# Patient Record
Sex: Female | Born: 1940 | Race: White | Hispanic: No | Marital: Single | Smoking: Former smoker
Health system: Southern US, Community
[De-identification: ages and names within clinical notes are randomized; demographics above are authoritative.]

## PROBLEM LIST (undated history)

## (undated) DIAGNOSIS — R011 Cardiac murmur, unspecified: Secondary | ICD-10-CM

## (undated) DIAGNOSIS — K5792 Diverticulitis of intestine, part unspecified, without perforation or abscess without bleeding: Secondary | ICD-10-CM

## (undated) DIAGNOSIS — E785 Hyperlipidemia, unspecified: Secondary | ICD-10-CM

## (undated) DIAGNOSIS — I5189 Other ill-defined heart diseases: Secondary | ICD-10-CM

## (undated) DIAGNOSIS — M47812 Spondylosis without myelopathy or radiculopathy, cervical region: Secondary | ICD-10-CM

## (undated) DIAGNOSIS — I480 Paroxysmal atrial fibrillation: Secondary | ICD-10-CM

## (undated) DIAGNOSIS — I10 Essential (primary) hypertension: Secondary | ICD-10-CM

## (undated) DIAGNOSIS — M8580 Other specified disorders of bone density and structure, unspecified site: Secondary | ICD-10-CM

## (undated) DIAGNOSIS — K219 Gastro-esophageal reflux disease without esophagitis: Secondary | ICD-10-CM

## (undated) DIAGNOSIS — I340 Nonrheumatic mitral (valve) insufficiency: Secondary | ICD-10-CM

## (undated) DIAGNOSIS — K449 Diaphragmatic hernia without obstruction or gangrene: Secondary | ICD-10-CM

## (undated) DIAGNOSIS — I060 Rheumatic aortic stenosis: Secondary | ICD-10-CM

## (undated) HISTORY — DX: Cardiac murmur, unspecified: R01.1

## (undated) HISTORY — PX: US ECHOCARDIOGRAPHY: HXRAD669

## (undated) HISTORY — DX: Hyperlipidemia, unspecified: E78.5

## (undated) HISTORY — DX: Rheumatic aortic stenosis: I06.0

## (undated) HISTORY — DX: Paroxysmal atrial fibrillation: I48.0

## (undated) HISTORY — DX: Other specified disorders of bone density and structure, unspecified site: M85.80

## (undated) HISTORY — DX: Diaphragmatic hernia without obstruction or gangrene: K44.9

## (undated) HISTORY — PX: OTHER SURGICAL HISTORY: SHX169

## (undated) HISTORY — PX: TOTAL VAGINAL HYSTERECTOMY: SHX2548

## (undated) HISTORY — DX: Spondylosis without myelopathy or radiculopathy, cervical region: M47.812

## (undated) HISTORY — DX: Diverticulitis of intestine, part unspecified, without perforation or abscess without bleeding: K57.92

## (undated) HISTORY — PX: CHOLECYSTECTOMY: SHX55

## (undated) HISTORY — DX: Nonrheumatic mitral (valve) insufficiency: I34.0

## (undated) HISTORY — DX: Essential (primary) hypertension: I10

## (undated) HISTORY — DX: Other ill-defined heart diseases: I51.89

## (undated) HISTORY — DX: Gastro-esophageal reflux disease without esophagitis: K21.9

---

## 1997-03-19 ENCOUNTER — Ambulatory Visit (HOSPITAL_COMMUNITY): Admission: RE | Admit: 1997-03-19 | Discharge: 1997-03-19 | Payer: Self-pay | Admitting: Family Medicine

## 1997-03-26 ENCOUNTER — Ambulatory Visit (HOSPITAL_COMMUNITY): Admission: RE | Admit: 1997-03-26 | Discharge: 1997-03-26 | Payer: Self-pay | Admitting: Family Medicine

## 1997-07-11 ENCOUNTER — Ambulatory Visit (HOSPITAL_COMMUNITY): Admission: RE | Admit: 1997-07-11 | Discharge: 1997-07-11 | Payer: Self-pay | Admitting: Family Medicine

## 1998-04-17 ENCOUNTER — Ambulatory Visit (HOSPITAL_COMMUNITY): Admission: RE | Admit: 1998-04-17 | Discharge: 1998-04-17 | Payer: Self-pay | Admitting: Family Medicine

## 1998-04-17 ENCOUNTER — Encounter: Payer: Self-pay | Admitting: Family Medicine

## 1998-06-03 ENCOUNTER — Other Ambulatory Visit: Admission: RE | Admit: 1998-06-03 | Discharge: 1998-06-03 | Payer: Self-pay | Admitting: Family Medicine

## 1999-12-13 ENCOUNTER — Emergency Department (HOSPITAL_COMMUNITY): Admission: EM | Admit: 1999-12-13 | Discharge: 1999-12-13 | Payer: Self-pay | Admitting: Emergency Medicine

## 2000-10-31 ENCOUNTER — Encounter: Payer: Self-pay | Admitting: Specialist

## 2000-11-07 ENCOUNTER — Encounter: Payer: Self-pay | Admitting: Specialist

## 2000-11-07 ENCOUNTER — Inpatient Hospital Stay (HOSPITAL_COMMUNITY): Admission: RE | Admit: 2000-11-07 | Discharge: 2000-11-08 | Payer: Self-pay | Admitting: Specialist

## 2001-01-24 ENCOUNTER — Emergency Department (HOSPITAL_COMMUNITY): Admission: EM | Admit: 2001-01-24 | Discharge: 2001-01-25 | Payer: Self-pay | Admitting: Emergency Medicine

## 2001-07-17 ENCOUNTER — Other Ambulatory Visit: Admission: RE | Admit: 2001-07-17 | Discharge: 2001-07-17 | Payer: Self-pay | Admitting: Family Medicine

## 2001-08-24 ENCOUNTER — Ambulatory Visit (HOSPITAL_COMMUNITY): Admission: RE | Admit: 2001-08-24 | Discharge: 2001-08-24 | Payer: Self-pay | Admitting: *Deleted

## 2002-08-17 ENCOUNTER — Inpatient Hospital Stay (HOSPITAL_COMMUNITY): Admission: EM | Admit: 2002-08-17 | Discharge: 2002-08-19 | Payer: Self-pay | Admitting: Emergency Medicine

## 2002-08-17 ENCOUNTER — Encounter: Payer: Self-pay | Admitting: Emergency Medicine

## 2002-08-19 ENCOUNTER — Encounter (INDEPENDENT_AMBULATORY_CARE_PROVIDER_SITE_OTHER): Payer: Self-pay | Admitting: Cardiology

## 2002-08-19 ENCOUNTER — Encounter: Payer: Self-pay | Admitting: Internal Medicine

## 2002-12-26 ENCOUNTER — Other Ambulatory Visit: Admission: RE | Admit: 2002-12-26 | Discharge: 2002-12-26 | Payer: Self-pay | Admitting: *Deleted

## 2003-02-26 ENCOUNTER — Ambulatory Visit (HOSPITAL_COMMUNITY): Admission: RE | Admit: 2003-02-26 | Discharge: 2003-02-26 | Payer: Self-pay | Admitting: Gastroenterology

## 2003-11-25 ENCOUNTER — Ambulatory Visit (HOSPITAL_COMMUNITY): Admission: RE | Admit: 2003-11-25 | Discharge: 2003-11-25 | Payer: Self-pay | Admitting: Cardiology

## 2003-12-12 ENCOUNTER — Ambulatory Visit: Payer: Self-pay | Admitting: Internal Medicine

## 2003-12-18 ENCOUNTER — Ambulatory Visit: Payer: Self-pay | Admitting: Pulmonary Disease

## 2003-12-18 ENCOUNTER — Ambulatory Visit: Admission: RE | Admit: 2003-12-18 | Discharge: 2003-12-18 | Payer: Self-pay | Admitting: Internal Medicine

## 2004-01-07 ENCOUNTER — Ambulatory Visit: Payer: Self-pay | Admitting: Internal Medicine

## 2004-01-23 ENCOUNTER — Ambulatory Visit: Payer: Self-pay | Admitting: Internal Medicine

## 2004-02-20 ENCOUNTER — Ambulatory Visit: Payer: Self-pay | Admitting: Internal Medicine

## 2005-04-12 ENCOUNTER — Ambulatory Visit (HOSPITAL_COMMUNITY): Admission: RE | Admit: 2005-04-12 | Discharge: 2005-04-12 | Payer: Self-pay | Admitting: *Deleted

## 2005-11-14 ENCOUNTER — Other Ambulatory Visit: Admission: RE | Admit: 2005-11-14 | Discharge: 2005-11-14 | Payer: Self-pay | Admitting: Family Medicine

## 2007-01-22 ENCOUNTER — Ambulatory Visit (HOSPITAL_COMMUNITY): Admission: RE | Admit: 2007-01-22 | Discharge: 2007-01-22 | Payer: Self-pay | Admitting: Cardiology

## 2007-02-08 ENCOUNTER — Inpatient Hospital Stay (HOSPITAL_COMMUNITY): Admission: AD | Admit: 2007-02-08 | Discharge: 2007-02-10 | Payer: Self-pay | Admitting: Cardiology

## 2007-11-27 ENCOUNTER — Emergency Department (HOSPITAL_BASED_OUTPATIENT_CLINIC_OR_DEPARTMENT_OTHER): Admission: EM | Admit: 2007-11-27 | Discharge: 2007-11-27 | Payer: Self-pay | Admitting: Emergency Medicine

## 2009-03-03 ENCOUNTER — Encounter: Payer: Self-pay | Admitting: Internal Medicine

## 2009-03-13 ENCOUNTER — Ambulatory Visit (HOSPITAL_COMMUNITY): Admission: RE | Admit: 2009-03-13 | Discharge: 2009-03-13 | Payer: Self-pay | Admitting: Cardiology

## 2009-08-18 ENCOUNTER — Encounter: Payer: Self-pay | Admitting: Internal Medicine

## 2009-09-15 ENCOUNTER — Encounter: Payer: Self-pay | Admitting: Internal Medicine

## 2009-09-18 ENCOUNTER — Encounter: Payer: Self-pay | Admitting: Internal Medicine

## 2009-09-24 ENCOUNTER — Encounter: Payer: Self-pay | Admitting: Internal Medicine

## 2009-09-24 ENCOUNTER — Ambulatory Visit (HOSPITAL_COMMUNITY): Admission: RE | Admit: 2009-09-24 | Discharge: 2009-09-24 | Payer: Self-pay | Admitting: Cardiology

## 2009-10-02 ENCOUNTER — Encounter: Payer: Self-pay | Admitting: Internal Medicine

## 2009-10-08 ENCOUNTER — Encounter: Payer: Self-pay | Admitting: Internal Medicine

## 2009-10-19 ENCOUNTER — Telehealth: Payer: Self-pay | Admitting: Internal Medicine

## 2009-10-21 DIAGNOSIS — M159 Polyosteoarthritis, unspecified: Secondary | ICD-10-CM | POA: Insufficient documentation

## 2009-10-21 DIAGNOSIS — I10 Essential (primary) hypertension: Secondary | ICD-10-CM

## 2009-10-21 DIAGNOSIS — E7800 Pure hypercholesterolemia, unspecified: Secondary | ICD-10-CM

## 2009-10-21 DIAGNOSIS — I060 Rheumatic aortic stenosis: Secondary | ICD-10-CM

## 2009-10-21 DIAGNOSIS — K219 Gastro-esophageal reflux disease without esophagitis: Secondary | ICD-10-CM | POA: Insufficient documentation

## 2009-10-21 DIAGNOSIS — I4891 Unspecified atrial fibrillation: Secondary | ICD-10-CM | POA: Insufficient documentation

## 2009-10-23 ENCOUNTER — Ambulatory Visit: Payer: Self-pay | Admitting: Internal Medicine

## 2009-11-10 HISTORY — PX: OTHER SURGICAL HISTORY: SHX169

## 2009-11-18 ENCOUNTER — Encounter: Payer: Self-pay | Admitting: Internal Medicine

## 2009-11-23 ENCOUNTER — Encounter: Payer: Self-pay | Admitting: Internal Medicine

## 2009-11-24 ENCOUNTER — Encounter (INDEPENDENT_AMBULATORY_CARE_PROVIDER_SITE_OTHER): Payer: Self-pay | Admitting: *Deleted

## 2009-11-24 ENCOUNTER — Telehealth: Payer: Self-pay | Admitting: Internal Medicine

## 2009-11-24 ENCOUNTER — Ambulatory Visit: Payer: Self-pay | Admitting: Internal Medicine

## 2009-11-26 LAB — CONVERTED CEMR LAB
BUN: 25 mg/dL — ABNORMAL HIGH (ref 6–23)
Basophils Absolute: 0.1 10*3/uL (ref 0.0–0.1)
Basophils Relative: 0.7 % (ref 0.0–3.0)
CO2: 28 meq/L (ref 19–32)
Calcium: 9.7 mg/dL (ref 8.4–10.5)
Chloride: 99 meq/L (ref 96–112)
Creatinine, Ser: 1.2 mg/dL (ref 0.4–1.2)
Eosinophils Absolute: 0.2 10*3/uL (ref 0.0–0.7)
Eosinophils Relative: 3.2 % (ref 0.0–5.0)
GFR calc non Af Amer: 47.69 mL/min (ref >60)
Glucose, Bld: 92 mg/dL (ref 70–99)
HCT: 39.2 % (ref 36.0–46.0)
Hemoglobin: 13.3 g/dL (ref 12.0–15.0)
INR: 2.2 — ABNORMAL HIGH (ref 0.8–1.0)
Lymphocytes Relative: 38.3 % (ref 12.0–46.0)
Lymphs Abs: 2.7 10*3/uL (ref 0.7–4.0)
MCHC: 33.9 g/dL (ref 30.0–36.0)
MCV: 91.2 fL (ref 78.0–100.0)
Monocytes Absolute: 0.5 10*3/uL (ref 0.1–1.0)
Monocytes Relative: 7 % (ref 3.0–12.0)
Neutro Abs: 3.6 10*3/uL (ref 1.4–7.7)
Neutrophils Relative %: 50.8 % (ref 43.0–77.0)
Platelets: 281 10*3/uL (ref 150.0–400.0)
Potassium: 3.3 meq/L — ABNORMAL LOW (ref 3.5–5.1)
Prothrombin Time: 23.9 s — ABNORMAL HIGH (ref 9.7–11.8)
RBC: 4.3 M/uL (ref 3.87–5.11)
RDW: 14.1 % (ref 11.5–14.6)
Sodium: 137 meq/L (ref 135–145)
WBC: 7 10*3/uL (ref 4.5–10.5)
aPTT: 36.6 s — ABNORMAL HIGH (ref 21.7–28.8)

## 2009-11-30 ENCOUNTER — Ambulatory Visit (HOSPITAL_COMMUNITY): Admission: RE | Admit: 2009-11-30 | Discharge: 2009-11-30 | Payer: Self-pay | Admitting: Cardiology

## 2009-11-30 ENCOUNTER — Encounter (INDEPENDENT_AMBULATORY_CARE_PROVIDER_SITE_OTHER): Payer: Self-pay | Admitting: Cardiology

## 2009-12-01 ENCOUNTER — Ambulatory Visit: Payer: Self-pay | Admitting: Internal Medicine

## 2009-12-01 ENCOUNTER — Ambulatory Visit (HOSPITAL_COMMUNITY): Admission: RE | Admit: 2009-12-01 | Discharge: 2009-12-02 | Payer: Self-pay | Admitting: Internal Medicine

## 2009-12-24 ENCOUNTER — Encounter: Payer: Self-pay | Admitting: Internal Medicine

## 2009-12-30 ENCOUNTER — Encounter: Payer: Self-pay | Admitting: Internal Medicine

## 2009-12-30 ENCOUNTER — Ambulatory Visit: Payer: Self-pay | Admitting: Internal Medicine

## 2010-01-01 LAB — CONVERTED CEMR LAB
ALT: 22 units/L (ref 0–35)
AST: 25 units/L (ref 0–37)
Albumin: 4.2 g/dL (ref 3.5–5.2)
Alkaline Phosphatase: 79 units/L (ref 39–117)
Bilirubin, Direct: 0.1 mg/dL (ref 0.0–0.3)
TSH: 2 microintl units/mL (ref 0.35–5.50)
Total Bilirubin: 1 mg/dL (ref 0.3–1.2)
Total Protein: 7.7 g/dL (ref 6.0–8.3)

## 2010-01-20 ENCOUNTER — Ambulatory Visit
Admission: RE | Admit: 2010-01-20 | Discharge: 2010-01-20 | Payer: Self-pay | Source: Home / Self Care | Attending: Internal Medicine | Admitting: Internal Medicine

## 2010-01-20 ENCOUNTER — Encounter: Payer: Self-pay | Admitting: Internal Medicine

## 2010-01-20 ENCOUNTER — Other Ambulatory Visit: Payer: Self-pay | Admitting: Internal Medicine

## 2010-01-21 ENCOUNTER — Encounter: Payer: Self-pay | Admitting: Internal Medicine

## 2010-01-21 LAB — CBC WITH DIFFERENTIAL/PLATELET
Basophils Absolute: 0 10*3/uL (ref 0.0–0.1)
Basophils Relative: 0.4 % (ref 0.0–3.0)
Eosinophils Absolute: 0.3 10*3/uL (ref 0.0–0.7)
Eosinophils Relative: 4.8 % (ref 0.0–5.0)
HCT: 39 % (ref 36.0–46.0)
Hemoglobin: 13.5 g/dL (ref 12.0–15.0)
Lymphocytes Relative: 33.3 % (ref 12.0–46.0)
Lymphs Abs: 2 10*3/uL (ref 0.7–4.0)
MCHC: 34.7 g/dL (ref 30.0–36.0)
MCV: 90.8 fl (ref 78.0–100.0)
Monocytes Absolute: 0.4 10*3/uL (ref 0.1–1.0)
Monocytes Relative: 7.3 % (ref 3.0–12.0)
Neutro Abs: 3.2 10*3/uL (ref 1.4–7.7)
Neutrophils Relative %: 54.2 % (ref 43.0–77.0)
Platelets: 283 10*3/uL (ref 150.0–400.0)
RBC: 4.29 Mil/uL (ref 3.87–5.11)
RDW: 14.3 % (ref 11.5–14.6)
WBC: 6 10*3/uL (ref 4.5–10.5)

## 2010-01-21 LAB — BASIC METABOLIC PANEL
BUN: 32 mg/dL — ABNORMAL HIGH (ref 6–23)
CO2: 30 mEq/L (ref 19–32)
Calcium: 9.1 mg/dL (ref 8.4–10.5)
Chloride: 100 mEq/L (ref 96–112)
Creatinine, Ser: 1.8 mg/dL — ABNORMAL HIGH (ref 0.4–1.2)
GFR: 29.95 mL/min — ABNORMAL LOW (ref >60.00)
Glucose, Bld: 104 mg/dL — ABNORMAL HIGH (ref 70–99)
Potassium: 3.2 mEq/L — ABNORMAL LOW (ref 3.5–5.1)
Sodium: 138 mEq/L (ref 135–145)

## 2010-01-21 LAB — APTT: aPTT: 40.2 s — ABNORMAL HIGH (ref 21.7–28.8)

## 2010-01-21 LAB — PROTIME-INR
INR: 3.3 ratio — ABNORMAL HIGH (ref 0.8–1.0)
Prothrombin Time: 33.2 s — ABNORMAL HIGH (ref 10.2–12.4)

## 2010-01-21 LAB — MAGNESIUM: Magnesium: 1.9 mg/dL (ref 1.5–2.5)

## 2010-01-22 ENCOUNTER — Telehealth (INDEPENDENT_AMBULATORY_CARE_PROVIDER_SITE_OTHER): Payer: Self-pay | Admitting: *Deleted

## 2010-01-26 ENCOUNTER — Telehealth: Payer: Self-pay | Admitting: Internal Medicine

## 2010-01-27 ENCOUNTER — Other Ambulatory Visit: Payer: Self-pay | Admitting: Internal Medicine

## 2010-01-27 ENCOUNTER — Ambulatory Visit
Admission: RE | Admit: 2010-01-27 | Discharge: 2010-01-27 | Payer: Self-pay | Source: Home / Self Care | Attending: Internal Medicine | Admitting: Internal Medicine

## 2010-01-28 ENCOUNTER — Ambulatory Visit (HOSPITAL_COMMUNITY)
Admission: RE | Admit: 2010-01-28 | Discharge: 2010-01-28 | Payer: Self-pay | Source: Home / Self Care | Attending: Internal Medicine | Admitting: Internal Medicine

## 2010-01-28 LAB — BASIC METABOLIC PANEL
BUN: 26 mg/dL — ABNORMAL HIGH (ref 6–23)
CO2: 27 mEq/L (ref 19–32)
Calcium: 9 mg/dL (ref 8.4–10.5)
Chloride: 102 mEq/L (ref 96–112)
Creatinine, Ser: 1.7 mg/dL — ABNORMAL HIGH (ref 0.4–1.2)
GFR: 32.24 mL/min — ABNORMAL LOW (ref >60.00)
Glucose, Bld: 90 mg/dL (ref 70–99)
Potassium: 3.4 mEq/L — ABNORMAL LOW (ref 3.5–5.1)
Sodium: 139 mEq/L (ref 135–145)

## 2010-02-01 ENCOUNTER — Telehealth: Payer: Self-pay | Admitting: Internal Medicine

## 2010-02-01 LAB — CBC
HCT: 36.3 % (ref 36.0–46.0)
Hemoglobin: 12 g/dL (ref 12.0–15.0)
MCH: 29.2 pg (ref 26.0–34.0)
MCHC: 33.1 g/dL (ref 30.0–36.0)
MCV: 88.3 fL (ref 78.0–100.0)
Platelets: 260 10*3/uL (ref 150–400)
RBC: 4.11 MIL/uL (ref 3.87–5.11)
RDW: 13.8 % (ref 11.5–15.5)
WBC: 4.7 10*3/uL (ref 4.0–10.5)

## 2010-02-01 LAB — PROTIME-INR
INR: 2.55 — ABNORMAL HIGH (ref 0.00–1.49)
Prothrombin Time: 27.5 seconds — ABNORMAL HIGH (ref 11.6–15.2)

## 2010-02-01 LAB — APTT: aPTT: 38 seconds — ABNORMAL HIGH (ref 24–37)

## 2010-02-01 LAB — BASIC METABOLIC PANEL
BUN: 19 mg/dL (ref 6–23)
CO2: 29 mEq/L (ref 19–32)
Calcium: 9.3 mg/dL (ref 8.4–10.5)
Chloride: 105 mEq/L (ref 96–112)
Creatinine, Ser: 1.2 mg/dL (ref 0.4–1.2)
GFR calc Af Amer: 54 mL/min — ABNORMAL LOW (ref >60)
GFR calc non Af Amer: 44 mL/min — ABNORMAL LOW (ref >60)
Glucose, Bld: 117 mg/dL — ABNORMAL HIGH (ref 70–99)
Potassium: 4.3 mEq/L (ref 3.5–5.1)
Sodium: 142 mEq/L (ref 135–145)

## 2010-02-05 NOTE — Discharge Summary (Signed)
  NAME:  STEWART, PIMENTA NO.:  [id number]  MEDICAL RECORD NO.:  [id number]          PATIENT TYPE:  OIB  LOCATION:  2899                         FACILITY:  [facility]  PHYSICIAN:  Pricilla Riffle, MD, FACCDATE OF BIRTH:  November 11, 1940  DATE OF ADMISSION:  01/28/2010 DATE OF DISCHARGE:  01/28/2010                              DISCHARGE SUMMARY   IDENTIFICATION:  Ms. Huffaker is a 70 year old with atrial fibrillation, planned for cardioversion.  The patient anesthetized by Anesthesia with 50 mg propofol IV.  Pads placed in the AP position.  The patient was cardioverted first with 150 joules synchronized biphasic energy.  This was unsuccessful.  A second attempt with 200 joules synchronized biphasic energy was applied and successful for cardioversion to sinus rhythm.  A 12-lead EKG pending.  Procedure without complications.     Pricilla Riffle, MD, [facility]     PVR/MEDQ  D:  01/29/2010  T:  01/29/2010  Job:  [id number]  Electronically Signed by Dietrich Pates MD [facility] on 02/05/2010 07:55:29 PM

## 2010-02-08 ENCOUNTER — Encounter: Payer: Self-pay | Admitting: Internal Medicine

## 2010-02-09 NOTE — Assessment & Plan Note (Signed)
Summary: poss ablation/mt  Medications Added CLONIDINE HCL 0.2 MG/24HR PTWK (CLONIDINE HCL) weekly DILTIAZEM HCL ER BEADS 180 MG XR24H-CAP (DILTIAZEM HCL ER BEADS) Take one capsule by mouth daily LOSARTAN POTASSIUM-HCTZ 100-25 MG TABS (LOSARTAN POTASSIUM-HCTZ) once daily PANTOPRAZOLE SODIUM 40 MG TBEC (PANTOPRAZOLE SODIUM) once daily WARFARIN SODIUM 5 MG TABS (WARFARIN SODIUM) Use as directed by Anticoagulation Clinic SOTALOL HCL 160 MG TABS (SOTALOL HCL) Take one tablet by mouth twice a day GLUCOSAMINE-CHONDROITIN  CAPS (GLUCOSAMINE-CHONDROIT-VIT C-MN) once daily CALCIUM 1500 MG TABS (CALCIUM CARBONATE) one tablet two times a day      Allergies Added: NKDA  Visit Type:  Initial Consult Referring Provider:  Dr Anne Fu Primary Provider:  Sigmund Hazel MD [facility])  CC:  afib.  History of Present Illness: Ashley [name] is a pleasant 70 yo WF with a h/o persistent atrial fibrillation, HTN, and rheumatic aortic stenosis who presents today for EP consultation regarding her atrial fibrillation.  She reports that she was initially diagnosed with atrial fibrillation 11/2006 after presenting to her primary care physicians office for routine physical exam.  She reports symptoms of shortness of breath, fatigue, and decreased exercise tolerance with her atrial fibrillation.  She was placed on coumadin, metoprolol, and cardizem and underwent cardioversion 1/09.  She reports returning to atrial fibrillation within several days.  She was then placed on sotalol.  Despite sotalol, she reports increasing frequency and duration.  Her sotalol was increased to 160mg  two times a day.  She reports progressive SOB and decreased exercise tolerance.  Most recently, she was cardioverted 09/25/09.  She reports returning to afib within several days.  She now presents for EP consultation.  Current Medications (verified): 1)  Clonidine Hcl 0.2 Mg/24hr Ptwk (Clonidine Hcl) .... Weekly 2)  Diltiazem Hcl Er Beads 180 Mg Xr24h-Cap  (Diltiazem Hcl Er Beads) .... Take One Capsule By Mouth Daily 3)  Losartan Potassium-Hctz 100-25 Mg Tabs (Losartan Potassium-Hctz) .... Once Daily 4)  Pantoprazole Sodium 40 Mg Tbec (Pantoprazole Sodium) .... Once Daily 5)  Warfarin Sodium 5 Mg Tabs (Warfarin Sodium) .... Use As Directed By Anticoagulation Clinic 6)  Sotalol Hcl 160 Mg Tabs (Sotalol Hcl) .... Take One Tablet By Mouth Twice A Day 7)  Glucosamine-Chondroitin  Caps (Glucosamine-Chondroit-Vit C-Mn) .... Once Daily 8)  Calcium 1500 Mg Tabs (Calcium Carbonate) .... One Tablet Two Times A Day  Allergies (verified): No Known Drug Allergies  Past History:  Past Medical History: Persistent atrial fibrillation HTN rheumatic aortic stenosis HL DJD GERD  pt does not recall h/o rheumatic fever  Past Surgical History: TAH s/p cholecystectomy knee surgery neck surgery bilateral shoulder surgery  Family History:  Mother died at age 45 of complications related to an  MI.  Father died at age 41 of the same.  Brother and sister have hypertension but otherwise are healthy.   Social History:  She is divorced and lives in [place], alone.  She works at [employer] and [employer].  Tob- quit 1990.  She had smoked anywhere from a half  to a pack a day for 10-15 years.  Rare EtOH and no illicit drug use.   Review of Systems       All systems are reviewed and negative except as listed in the HPI.   Vital Signs:  Patient profile:   70 year old female Height:      65 inches Weight:      177 pounds BMI:     29.56 Pulse rate:   87 / minute BP  sitting:   140 / 90  (left arm)  Vitals Entered By: Laurance Flatten CMA (October 23, 2009 10:22 AM)  Physical Exam  General:  Well developed, well nourished, in no acute distress. Head:  normocephalic and atraumatic Eyes:  PERRLA/EOM intact; conjunctiva and lids normal. Mouth:  Teeth, gums and palate normal. Oral mucosa normal. Neck:  Neck supple, no JVD. No masses, thyromegaly or  abnormal cervical nodes. Lungs:  Clear bilaterally to auscultation and percussion. Heart:  iRRR, no m/r/g Abdomen:  Bowel sounds positive; abdomen soft and non-tender without masses, organomegaly, or hernias noted. No hepatosplenomegaly. Msk:  Back normal, normal gait. Muscle strength and tone normal. Pulses:  pulses normal in all 4 extremities Extremities:  No clubbing or cyanosis. Neurologic:  Alert and oriented x 3. Skin:  Intact without lesions or rashes. Cervical Nodes:  no significant adenopathy Psych:  Normal affect.   Echocardiogram  Procedure date:  03/03/2009  Findings:      Echocardiogram  Procedure date:  03/03/2009  Findings:      Left vetricular EF estimated by 2D at 55-60 percent. Mild mitral valve regurgitation. Moderate calcification of the aortic valve. Mild aortic valve regurgitation. Mild aortic valve stenosis. Trace of pulmonic valve regurgitation. Mild tricuspid regurgitation.  LA 3.7 cm   Armanda Magic, MD     EKG  Procedure date:  10/25/2009  Findings:      coarse afib,  V rate 87 bpm, QTc 522  Impression & Recommendations:  Problem # 1:  ATRIAL FIBRILLATION (ICD-427.31) Ashley [name] is a pleasant 70 yo WF with a h/o symptomatic persistent atrial fibrillation.  She has failed medical therapy with sotalol and diltiazem.  Therapeutic strategies for afib including medicine and ablation were discussed in detail with the patient today. Risk, benefits, and alternatives to EP study and radiofrequency ablation for afib were also discussed in detail today. These risks include but are not limited to stroke, bleeding, vascular damage, tamponade, perforation, damage to the esophagus, lungs, and other structures, pulmonary vein stenosis, worsening renal function, and death. The patient understands these risk and wishes to proceed.  The patient understands that given her persisent afib, her anticipated success rate from ablation is decreased (50-60%) and will  likely require more than one procedure.   We will continue coumadin and proceed with afib ablation at the next available time.  Problem # 2:  ESSENTIAL HYPERTENSION, BENIGN (ICD-401.1) stable  Problem # 3:  PURE HYPERCHOLESTEROLEMIA (ICD-272.0) stable  Other Orders: EKG w/ Interpretation (93000)

## 2010-02-09 NOTE — Letter (Signed)
Summary: [facility] Visit Note   [facility] Office Visit Note   Imported By: Roderic Ovens 11/11/2009 15:38:27  _____________________________________________________________________  External Attachment:    Type:   Image     Comment:   External Document

## 2010-02-09 NOTE — Letter (Signed)
Summary: [facility] [name] Physicians Office Visit Note   [facility] Physicians Office Visit Note   Imported By: Roderic Ovens 11/13/2009 12:54:31  _____________________________________________________________________  External Attachment:    Type:   Image     Comment:   External Document

## 2010-02-09 NOTE — Letter (Signed)
Summary: Electrophysiology/Ablation Procedure Instructions  Electrophysiology/Ablation Procedure Instructions   Imported By: Lenard Forth 11/23/2009 12:20:28  _____________________________________________________________________  External Attachment:    Type:   Image     Comment:   External Document

## 2010-02-09 NOTE — Progress Notes (Signed)
Summary: pt has questrions regarding ablation   Phone Note Call from Patient Call back at Home Phone [phone]   Caller: Patient Reason for Call: Talk to Nurse, Talk to Doctor Summary of Call: pt has questions regarding ablation Initial call taken by: Omer Jack,  October 19, 2009 3:42 PM  Follow-up for Phone Call        pt ok and will keep appointment with Dr Johney Frame Dennis Bast, RN, BSN  October 19, 2009 3:52 PM

## 2010-02-09 NOTE — Letter (Signed)
Summary: [facility] Visit Note   [facility] Physicians Office Visit Note   Imported By: Roderic Ovens 11/11/2009 15:38:49  _____________________________________________________________________  External Attachment:    Type:   Image     Comment:   External Document

## 2010-02-09 NOTE — Letter (Signed)
Summary: [facility] Visit Note   [facility] Physicians Office Visit Note   Imported By: Roderic Ovens 11/11/2009 15:39:08  _____________________________________________________________________  External Attachment:    Type:   Image     Comment:   External Document

## 2010-02-09 NOTE — Progress Notes (Signed)
Summary: question re bloodwork-has lab appt today   Phone Note Call from Patient   Caller: Patient [phone] Reason for Call: Talk to Nurse Summary of Call: pt calling re bloodwork-has appt this pm for labs Initial call taken by: Glynda Jaeger,  November 24, 2009 8:27 AM  Follow-up for Phone Call        spoke with pt  got her labs for eagel  NO PTT drawn  Will come in today for PTT to go with other blood work Riki Rusk will sen INR Dennis Bast, RN, BSN  November 24, 2009 11:21 AM

## 2010-02-09 NOTE — Op Note (Signed)
Summary: [facility]  [facility]   Imported By: Roderic Ovens 11/11/2009 15:40:46  _____________________________________________________________________  External Attachment:    Type:   Image     Comment:   External Document

## 2010-02-10 ENCOUNTER — Other Ambulatory Visit (INDEPENDENT_AMBULATORY_CARE_PROVIDER_SITE_OTHER): Payer: MEDICARE

## 2010-02-10 ENCOUNTER — Ambulatory Visit: Admit: 2010-02-10 | Payer: Self-pay

## 2010-02-10 ENCOUNTER — Other Ambulatory Visit: Payer: Self-pay | Admitting: Internal Medicine

## 2010-02-10 ENCOUNTER — Encounter: Payer: Self-pay | Admitting: Internal Medicine

## 2010-02-10 DIAGNOSIS — I10 Essential (primary) hypertension: Secondary | ICD-10-CM

## 2010-02-11 LAB — BASIC METABOLIC PANEL
BUN: 21 mg/dL (ref 6–23)
CO2: 21 mEq/L (ref 19–32)
Calcium: 8.7 mg/dL (ref 8.4–10.5)
Chloride: 107 mEq/L (ref 96–112)
Creatinine, Ser: 1.5 mg/dL — ABNORMAL HIGH (ref 0.4–1.2)
GFR: 36.2 mL/min — ABNORMAL LOW (ref >60.00)
Glucose, Bld: 119 mg/dL — ABNORMAL HIGH (ref 70–99)
Potassium: 4.2 mEq/L (ref 3.5–5.1)
Sodium: 137 mEq/L (ref 135–145)

## 2010-02-11 NOTE — Letter (Signed)
Summary: Cardioversion/TEE Instructions  Architectural technologist, Main Office  [address]   [place], [place] [address]   Phone: [phone]  Fax: [phone]    Cardioversion / TEE Cardioversion Instructions  01/20/2010 MRN: [id number]  [facility] [place] [address] [place], [place]  [address]  Dear Ms. Dowen, You are scheduled for a Cardioversion on 01/28/10 with Dr. Tenny Craw   Please arrive at the [facility] at 10:00am a.m. Marland Kitchen on the day of your procedure.  1)   DIET:  A)   Nothing to eat or drink after midnight except your medications with a sip of water.   2)   Come to the [place] office on 01/20/10 for lab work.You do not have to be fasting.  3)   MAKE SURE YOU TAKE YOUR COUMADIN.  4)   _  B)   YOU MAY TAKE ALL of your remaining medications with a small amount of water.    5)  Must have a responsible person to drive you home.  6)   Bring a current list of your medications and current insurance cards.   * Special Note:  Every effort is made to have your procedure done on time. Occasionally there are emergencies that present themselves at the hospital that may cause delays. Please be patient if a delay does occur.  * If you have any questions after you get home, please call the office at [phone]. Anselm Pancoast

## 2010-02-11 NOTE — Letter (Signed)
Summary: Ashley [name] Physicians Office Visit Note   [facility] Physicians Office Visit Note   Imported By: Roderic Ovens 01/26/2010 13:48:54  _____________________________________________________________________  External Attachment:    Type:   Image     Comment:   External Document

## 2010-02-11 NOTE — Assessment & Plan Note (Signed)
Summary: decrease Amiodarone 200mg  daily

## 2010-02-11 NOTE — Assessment & Plan Note (Signed)
Summary: eph/mt  Medications Added KLOR-CON M20 20 MEQ CR-TABS (POTASSIUM CHLORIDE CRYS CR) once daily CARVEDILOL 6.25 MG TABS (CARVEDILOL) Take one tablet by mouth twice a day VOLTAREN 1 % GEL (DICLOFENAC SODIUM) UAD HYDROCORTISONE 1 % CREA (HYDROCORTISONE) UAD ACETAMINOPHEN 325 MG TABS (ACETAMINOPHEN) as needed AMIODARONE HCL 400 MG TABS (AMIODARONE HCL) Take one tablet by mouth 3 times a day for 1 week, then take one tablet by mouth two times a day      Allergies Added: NKDA  Visit Type:  Follow-up Referring Provider:  Dr Anne Fu Primary Provider:  Sigmund Hazel MD [facility])   History of Present Illness: The patient presents today for routine electrophysiology followup. She reports doing very well since her afib ablation.  She denies procedure related complications.  She reports occasional arrhythmia with symptoms of palpitations and fatgue.  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  The patient denies symptoms of chest pain, shortness of breath, orthopnea, PND, lower extremity edema, dizziness, presyncope, syncope, or neurologic sequela. The patient is tolerating medications without difficulties and is otherwise without complaint today.   Current Medications (verified): 1)  Clonidine Hcl 0.2 Mg/24hr Ptwk (Clonidine Hcl) .... Weekly 2)  Diltiazem Hcl Er Beads 180 Mg Xr24h-Cap (Diltiazem Hcl Er Beads) .... Take One Capsule By Mouth Daily 3)  Losartan Potassium-Hctz 100-25 Mg Tabs (Losartan Potassium-Hctz) .... Once Daily 4)  Pantoprazole Sodium 40 Mg Tbec (Pantoprazole Sodium) .... Once Daily 5)  Warfarin Sodium 5 Mg Tabs (Warfarin Sodium) .... Use As Directed By Anticoagulation Clinic 6)  Glucosamine-Chondroitin  Caps (Glucosamine-Chondroit-Vit C-Mn) .... Once Daily 7)  Calcium 1500 Mg Tabs (Calcium Carbonate) .... One Tablet Two Times A Day 8)  Klor-Con M20 20 Meq Cr-Tabs (Potassium Chloride Crys Cr) .... Once Daily 9)  Carvedilol 6.25 Mg Tabs (Carvedilol)  .... Take One Tablet By Mouth Twice A Day 10)  Voltaren 1 % Gel (Diclofenac Sodium) .... Uad 11)  Hydrocortisone 1 % Crea (Hydrocortisone) .... Uad 12)  Acetaminophen 325 Mg Tabs (Acetaminophen) .... As Needed  Allergies (verified): No Known Drug Allergies  Past History:  Past Medical History: Reviewed history from 10/23/2009 and no changes required. Persistent atrial fibrillation HTN rheumatic aortic stenosis HL DJD GERD  pt does not recall h/o rheumatic fever  Past Surgical History: Reviewed history from 10/23/2009 and no changes required. TAH s/p cholecystectomy knee surgery neck surgery bilateral shoulder surgery  Social History: Reviewed history from 10/23/2009 and no changes required.  She is divorced and lives in [place], alone.  She works at [employer] and [employer].  Tob- quit 1990.  She had smoked anywhere from a half  to a pack a day for 10-15 years.  Rare EtOH and no illicit drug use.   Review of Systems       All systems are reviewed and negative except as listed in the HPI.   Vital Signs:  Patient profile:   70 year old female Height:      65 inches Weight:      176 pounds BMI:     29.39 Pulse rate:   77 / minute BP sitting:   120 / 90  (left arm)  Vitals Entered By: Laurance Flatten CMA (December 30, 2009 10:19 AM)  Physical Exam  General:  Well developed, well nourished, in no acute distress. Head:  normocephalic and atraumatic Eyes:  PERRLA/EOM intact; conjunctiva and lids normal. Mouth:  Teeth, gums and palate normal. Oral mucosa normal. Neck:  Neck  supple, no JVD. No masses, thyromegaly or abnormal cervical nodes. Lungs:  Clear bilaterally to auscultation and percussion. Heart:  iRRR, no m/r/g Abdomen:  Bowel sounds positive; abdomen soft and non-tender without masses, organomegaly, or hernias noted. No hepatosplenomegaly. Msk:  Back normal, normal gait. Muscle strength and tone normal. Extremities:  No clubbing or  cyanosis. Neurologic:  Alert and oriented x 3.   EKG  Procedure date:  12/30/2009  Findings:      atypical atrial flutter, V rate 85 bpm  Impression & Recommendations:  Problem # 1:  ATRIAL FIBRILLATION (ICD-427.31) The patient is doing well s/p ablation.  She is only 4 weeks post ablation.  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 At this point, I will start amiodarone to maintain sinus rhythm and promote atrial remodeling post ablation.  She will take 40mg  three times a day x 1 week, then 400mg  two times a day until return in 3 weeks.  We will plan cardioversion if she remains in afib.  She is aware of interaction of coumadin and amiodarone and will therefore have INR checked next week. We will check TFTs and LFTs today.  Problem # 2:  ESSENTIAL HYPERTENSION, BENIGN (ICD-401.1) stable  Problem # 3:  ESOPHAGEAL REFLUX (ICD-530.81) stable  Other Orders: EKG w/ Interpretation (93000) TLB-TSH (Thyroid Stimulating Hormone) (84443-TSH) TLB-Hepatic/Liver Function Pnl (80076-HEPATIC)  Patient Instructions: 1)  Your physician recommends that you schedule a follow-up appointment in: 3  weeks. 2)  Your physician recommends that you have  lab work today. 3)  Your physician has recommended you make the following change in your medication: AMIODARONE HCL 400 MG TABS (AMIODARONE HCL) ake one tablet by mouth 3 times a day for 1 week, then take one tablet by mouth two times a day   Prescriptions: AMIODARONE HCL 400 MG TABS (AMIODARONE HCL) Take one tablet by mouth 3 times a day for 1 week, then take one tablet by mouth two times a day  #90 x 1   Entered by:   Laurance Flatten CMA   Authorized by:   Hillis Range, MD   Signed by:   Laurance Flatten CMA on 12/30/2009   Method used:   Electronically to        [id number] (retail)       [address]       [place], [place]  [address]       Ph: [phone] or [phone]       Fax: [phone]   RxID:   [id number]

## 2010-02-11 NOTE — Assessment & Plan Note (Signed)
Summary: 3:15/ok per kelly/per check out/sf  Medications Added CARVEDILOL 12.5 MG TABS (CARVEDILOL) one by mouth two times a day AMIODARONE HCL 400 MG TABS (AMIODARONE HCL) Take one tablet by mouth daily AMIODARONE HCL 400 MG TABS (AMIODARONE HCL) Take one tablet by mouth two times a day      Allergies Added: NKDA  Visit Type:  Follow-up Referring Provider:  Dr Anne Fu Primary Provider:  Sigmund Hazel MD [facility])   History of Present Illness: The patient presents today for routine electrophysiology followup. She reports doing very well since her afib ablation.  She denies procedure related complications.  She remains in afib post ablation despite amiodarone, which she appears to be tolerating.  The patient denies symptoms of chest pain, shortness of breath, orthopnea, PND, lower extremity edema, dizziness, presyncope, syncope, or neurologic sequela. The patient is tolerating medications without difficulties and is otherwise without complaint today.   Current Medications (verified): 1)  Clonidine Hcl 0.2 Mg/24hr Ptwk (Clonidine Hcl) .... Weekly 2)  Diltiazem Hcl Er Beads 180 Mg Xr24h-Cap (Diltiazem Hcl Er Beads) .... Take One Capsule By Mouth Daily 3)  Losartan Potassium-Hctz 100-25 Mg Tabs (Losartan Potassium-Hctz) .... Once Daily 4)  Pantoprazole Sodium 40 Mg Tbec (Pantoprazole Sodium) .... Once Daily 5)  Warfarin Sodium 5 Mg Tabs (Warfarin Sodium) .... Use As Directed By Anticoagulation Clinic 6)  Glucosamine-Chondroitin  Caps (Glucosamine-Chondroit-Vit C-Mn) .... Once Daily 7)  Calcium 1500 Mg Tabs (Calcium Carbonate) .... One Tablet Two Times A Day 8)  Klor-Con M20 20 Meq Cr-Tabs (Potassium Chloride Crys Cr) .... Once Daily 9)  Carvedilol 6.25 Mg Tabs (Carvedilol) .... Take One Tablet By Mouth Twice A Day 10)  Voltaren 1 % Gel (Diclofenac Sodium) .... Uad 11)  Hydrocortisone 1 % Crea (Hydrocortisone) .... Uad 12)  Acetaminophen 325 Mg Tabs (Acetaminophen) .... As Needed 13)   Amiodarone Hcl 400 Mg Tabs (Amiodarone Hcl) .... Take One Tablet By Mouth Two Times A Day  Allergies (verified): No Known Drug Allergies  Past History:  Past Medical History: Reviewed history from 10/23/2009 and no changes required. Persistent atrial fibrillation HTN rheumatic aortic stenosis HL DJD GERD  pt does not recall h/o rheumatic fever  Past Surgical History: Reviewed history from 10/23/2009 and no changes required. TAH s/p cholecystectomy knee surgery neck surgery bilateral shoulder surgery  Family History: Reviewed history from 10/23/2009 and no changes required.  Mother died at age 79 of complications related to an  MI.  Father died at age 83 of the same.  Brother and sister have hypertension but otherwise are healthy.   Social History: Reviewed history from 10/23/2009 and no changes required.  She is divorced and lives in [place], alone.  She works at [employer] and [employer].  Tob- quit 1990.  She had smoked anywhere from a half  to a pack a day for 10-15 years.  Rare EtOH and no illicit drug use.   Review of Systems       All systems are reviewed and negative except as listed in the HPI.   Vital Signs:  Patient profile:   70 year old female Height:      65 inches Weight:      180 pounds BMI:     30.06 Pulse rate:   85 / minute BP sitting:   190 / 98  (left arm)  Vitals Entered By: Laurance Flatten CMA (January 20, 2010 3:33 PM)  Physical Exam  General:  Well developed, well nourished, in no acute distress. Head:  normocephalic and atraumatic Eyes:  PERRLA/EOM intact; conjunctiva and lids normal. Mouth:  Teeth, gums and palate normal. Oral mucosa normal. Neck:  Neck supple, no JVD. No masses, thyromegaly or abnormal cervical nodes. Lungs:  Clear bilaterally to auscultation and percussion. Heart:  iRRR, no m/r/g Abdomen:  Bowel sounds positive; abdomen soft and non-tender without masses, organomegaly, or hernias noted. No  hepatosplenomegaly. Msk:  Back normal, normal gait. Muscle strength and tone normal. Extremities:  No clubbing or cyanosis. Neurologic:  Alert and oriented x 3. Skin:  Intact without lesions or rashes. Psych:  Normal affect.   EKG  Procedure date:  01/20/2010  Findings:      afib, V rate 85 bpm, nonspecific ST/T changes, Qt 444  Impression & Recommendations:  Problem # 1:  ATRIAL FIBRILLATION (ICD-427.31) The patient is doing well s/p ablation.  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  She remains in afib despite amiodarone load.   We will therefore proceed with cardioversion.  Her INRs have been consistently 2-3.   I have decreased amiodarone to 400mg  daily today and will decrease amiodarone to 200mg  daily in 2weeks.  She will follow-up with me in 6 weeks.  Problem # 2:  ESSENTIAL HYPERTENSION, BENIGN (ICD-401.1) very elevated today salt restriction we will increase coreg to 12.5mg  two times a day today. IF BP remains elevated, we will consider adding spironolactone.  Other Orders: TLB-CBC Platelet - w/Differential (85025-CBCD) TLB-BMP (Basic Metabolic Panel-BMET) (80048-METABOL) TLB-PT (Protime) (85610-PTP) TLB-PTT (85730-PTTL) TLB-Magnesium (Mg) (83735-MG)  Patient Instructions: 1)  Your physician has recommended that you have a cardioversion (DCCV).  Electrical cardioversion uses a jolt of electricity to your heart either through paddles or wired patches attached to your chest. This is a controlled, usually prescheduled, procedure. Defibrillation is done under light anesthesia in the hospital, and you usually go home the day of the procedure. This is done to get your heart back into a normal rhythm. You are not awake for the procedure. Please see the instruction sheet given to you today. 2)  Your physician has recommended you make the following change in your medication: decrease Amiodarone to 400mg  daily 3)  increase Carvedilol to 12.5mg  two times a day( take 2  6.25mg  tablets twice daily until you finish) Prescriptions: CARVEDILOL 12.5 MG TABS (CARVEDILOL) one by mouth two times a day  #60 x 6   Entered by:   Dennis Bast, RN, BSN   Authorized by:   Hillis Range, MD   Signed by:   Dennis Bast, RN, BSN on 01/20/2010   Method used:   Electronically to        [facility] (retail)       [address]       [place], [place]  [address]       Ph: [phone] or [phone]       Fax: [phone]   RxID:   [id number]

## 2010-02-11 NOTE — Progress Notes (Signed)
Summary: c/o went afib again    Phone Note Call from Patient Call back at Home Phone [phone]   Caller: Patient Reason for Call: Talk to Nurse Summary of Call: pt had cardioversion on thursday. pt was sick on friday. went to pcp was told pt went back into afib.  Initial call taken by: Lorne Skeens,  February 01, 2010 2:02 PM  Follow-up for Phone Call        [facility]  Had fever and chills and threw up sinus drainage.  She is going to rest and get a follow up appt with Dr Johney Frame for [facility] DCCV Dennis Bast, RN, BSN  February 01, 2010 4:30 PM

## 2010-02-11 NOTE — Progress Notes (Signed)
Summary: lmtcb ./cy   Phone Note Outgoing Call   Call placed by: Scherrie Bateman, LPN,  January 22, 2010 10:10 AM Call placed to: Patient Summary of Call: LM FOR PT TO CALL BACK SEE LAB FROM 01/20/10. Initial call taken by: Scherrie Bateman, LPN,  January 22, 2010 10:10 AM  Follow-up for Phone Call        pT RETURNING CALL Ashley [name]  January 22, 2010 2:46 PM  LMTCB Scherrie Bateman, LPN  January 26, 2010 9:55 AM   Additional Follow-up for Phone Call Additional follow up Details #1::        Phone call completed PT AWARE OF MED CHANGES AND REPEAT LABS NEEDED SEE LAB FROM 01/20/10. Additional Follow-up by: Scherrie Bateman, LPN,  January 26, 2010 10:54 AM

## 2010-02-11 NOTE — Progress Notes (Signed)
Summary: pt has another question for you   Phone Note Call from Patient Call back at [phone]   Caller: Patient Reason for Call: Talk to Nurse Summary of Call: pt wants to know if she has to fast for blood work. please call before 1230pm Initial call taken by: Roe Coombs,  January 26, 2010 11:31 AM  Follow-up for Phone Call        Phone Call Completed PT AWARE LAB IS NON FASTING. Follow-up by: Scherrie Bateman, LPN,  January 26, 2010 11:36 AM

## 2010-02-17 NOTE — Miscellaneous (Signed)
Summary: Orders Update  Clinical Lists Changes  Orders: Added new Test order of TLB-BMP (Basic Metabolic Panel-BMET) (80048-METABOL) - Signed 

## 2010-02-17 NOTE — Medication Information (Signed)
Summary: Prescription Clarification   Prescription Clarification   Imported By: Roderic Ovens 02/10/2010 09:27:33  _____________________________________________________________________  External Attachment:    Type:   Image     Comment:   External Document

## 2010-02-25 ENCOUNTER — Encounter: Payer: Self-pay | Admitting: Internal Medicine

## 2010-03-11 ENCOUNTER — Encounter: Payer: Self-pay | Admitting: Internal Medicine

## 2010-03-11 ENCOUNTER — Encounter (INDEPENDENT_AMBULATORY_CARE_PROVIDER_SITE_OTHER): Payer: Medicare Other | Admitting: Internal Medicine

## 2010-03-11 DIAGNOSIS — I4892 Unspecified atrial flutter: Secondary | ICD-10-CM

## 2010-03-11 DIAGNOSIS — I4891 Unspecified atrial fibrillation: Secondary | ICD-10-CM

## 2010-03-11 DIAGNOSIS — I10 Essential (primary) hypertension: Secondary | ICD-10-CM

## 2010-03-12 ENCOUNTER — Telehealth: Payer: Self-pay | Admitting: Internal Medicine

## 2010-03-15 ENCOUNTER — Encounter (INDEPENDENT_AMBULATORY_CARE_PROVIDER_SITE_OTHER): Payer: Self-pay | Admitting: *Deleted

## 2010-03-18 NOTE — Assessment & Plan Note (Signed)
Summary: 2-3 week eph s/p dccv/per kelly/mt      Allergies Added: NKDA  Visit Type:  Follow-up Referring Provider:  Dr Mayford Knife Primary Provider:  Sigmund Hazel MD [facility])   History of Present Illness: The patient presents today for routine electrophysiology followup. She was cardioverted 01/28/10.  Unfortunately, she has returned to an atypical flutter despite medical therapy with amiodarone.  She reports intermittent poor exercise tolerance with her atrial arrhythmias.  She also has fatigue several days per week.  The patient denies symptoms of chest pain,  orthopnea, PND, lower extremity edema, dizziness, presyncope, syncope, or neurologic sequela. The patient is tolerating medications without difficulties and is otherwise without complaint today.   Current Medications (verified): 1)  Clonidine Hcl 0.2 Mg/24hr Ptwk (Clonidine Hcl) .... Weekly 2)  Diltiazem Hcl Er Beads 180 Mg Xr24h-Cap (Diltiazem Hcl Er Beads) .... Take One Capsule By Mouth Daily 3)  Pantoprazole Sodium 40 Mg Tbec (Pantoprazole Sodium) .... Once Daily 4)  Warfarin Sodium 5 Mg Tabs (Warfarin Sodium) .... Use As Directed By Anticoagulation Clinic 5)  Glucosamine-Chondroitin  Caps (Glucosamine-Chondroit-Vit C-Mn) .... Once Daily 6)  Calcium 1500 Mg Tabs (Calcium Carbonate) .... One Tablet Two Times A Day 7)  Klor-Con M20 20 Meq Cr-Tabs (Potassium Chloride Crys Cr) .... Once Daily 8)  Carvedilol 12.5 Mg Tabs (Carvedilol) .... One By Mouth Two Times A Day 9)  Voltaren 1 % Gel (Diclofenac Sodium) .... Uad 10)  Hydrocortisone 1 % Crea (Hydrocortisone) .... Uad 11)  Acetaminophen 325 Mg Tabs (Acetaminophen) .... As Needed 12)  Amiodarone Hcl 200 Mg Tabs (Amiodarone Hcl) .... One By Mouth Daily  Allergies (verified): No Known Drug Allergies  Past History:  Past Medical History: Persistent atrial fibrillation s/p afib ablation 12/01/09 HTN rheumatic aortic stenosis HL DJD GERD  pt does not recall h/o rheumatic  fever  Past Surgical History: TAH s/p cholecystectomy knee surgery neck surgery bilateral shoulder surgery afib ablation 12/01/09  Family History: Reviewed history from 10/23/2009 and no changes required.  Mother died at age 97 of complications related to an  MI.  Father died at age 52 of the same.  Brother and sister have hypertension but otherwise are healthy.   Social History: Reviewed history from 10/23/2009 and no changes required.  She is divorced and lives in [place], alone.  She works at [employer] and [employer].  Tob- quit 1990.  She had smoked anywhere from a half  to a pack a day for 10-15 years.  Rare EtOH and no illicit drug use.   Review of Systems       All systems are reviewed and negative except as listed in the HPI.   Vital Signs:  Patient profile:   70 year old female Height:      65 inches Weight:      168 pounds BMI:     28.06 Pulse rate:   74 / minute BP sitting:   140 / 80  (left arm)  Vitals Entered By: Laurance Flatten CMA (March 11, 2010 12:24 PM)  Physical Exam  General:  Well developed, well nourished, in no acute distress. Head:  normocephalic and atraumatic Eyes:  PERRLA/EOM intact; conjunctiva and lids normal. Mouth:  Teeth, gums and palate normal. Oral mucosa normal. Neck:  Neck supple, no JVD. No masses, thyromegaly or abnormal cervical nodes. Lungs:  Clear bilaterally to auscultation and percussion. Heart:  iRRR, no m/r/g Abdomen:  Bowel sounds positive; abdomen soft and non-tender without masses, organomegaly, or hernias noted.  No hepatosplenomegaly. Msk:  Back normal, normal gait. Muscle strength and tone normal. Extremities:  No clubbing or cyanosis. Neurologic:  Alert and oriented x 3. Skin:  Intact without lesions or rashes. Psych:  Normal affect.   EKG  Procedure date:  03/11/2010  Findings:      atypical atrial flutte, V rate 74 bpm  Impression & Recommendations:  Problem # 1:  ATRIAL FIBRILLATION  (ICD-427.31) The patient continues to have afib and atypical atrial flutter despite medical therapy with amiodarone.  Therapeutic strategies for her atrial arrhythmias including medicine and ablation were discussed in detail with the patient today. Risk, benefits, and alternatives to EP study and radiofrequency ablation were also discussed in detail today. These risks include but are not limited to stroke, bleeding, vascular damage, tamponade, perforation, damage to the esophagus, lungs, and other structures, pulmonary vein stenosis, worsening renal function, and death. The patient understands these risk and wishes to proceed.   We will plan cardiac CT to evaluate her pulmonary venous anatomy.  We will then proceed to repeat catheter ablation for persistant atrial fibrillation and atypical atrial flutter at the next available time.  She will need TEE the day prior to the procedure.  Problem # 2:  ESSENTIAL HYPERTENSION, BENIGN (ICD-401.1) stable no changes today  Problem # 3:  ESOPHAGEAL REFLUX (ICD-530.81) continue PPI Her updated medication list for this problem includes:    Pantoprazole Sodium 40 Mg Tbec (Pantoprazole sodium) ..... Once daily  Other Orders: EKG w/ Interpretation (93000)

## 2010-03-23 LAB — HEPATIC FUNCTION PANEL
ALT: 13 U/L (ref 0–35)
AST: 22 U/L (ref 0–37)
Albumin: 3.3 g/dL — ABNORMAL LOW (ref 3.5–5.2)
Alkaline Phosphatase: 70 U/L (ref 39–117)
Bilirubin, Direct: 0.4 mg/dL — ABNORMAL HIGH (ref 0.0–0.3)
Indirect Bilirubin: 0.5 mg/dL (ref 0.3–0.9)
Total Bilirubin: 0.9 mg/dL (ref 0.3–1.2)
Total Protein: 6.4 g/dL (ref 6.0–8.3)

## 2010-03-23 LAB — BASIC METABOLIC PANEL
BUN: 16 mg/dL (ref 6–23)
CO2: 30 mEq/L (ref 19–32)
Calcium: 9.2 mg/dL (ref 8.4–10.5)
Chloride: 105 mEq/L (ref 96–112)
Creatinine, Ser: 0.99 mg/dL (ref 0.4–1.2)
GFR calc Af Amer: >60 mL/min (ref >60)
GFR calc non Af Amer: 56 mL/min — ABNORMAL LOW (ref >60)
Glucose, Bld: 106 mg/dL — ABNORMAL HIGH (ref 70–99)
Potassium: 3.6 mEq/L (ref 3.5–5.1)
Sodium: 141 mEq/L (ref 135–145)

## 2010-03-23 LAB — MRSA PCR SCREENING: MRSA by PCR: NEGATIVE

## 2010-03-23 LAB — PROTIME-INR
INR: 2.36 — ABNORMAL HIGH (ref 0.00–1.49)
Prothrombin Time: 25.9 seconds — ABNORMAL HIGH (ref 11.6–15.2)

## 2010-03-23 LAB — APTT: aPTT: 40 seconds — ABNORMAL HIGH (ref 24–37)

## 2010-03-23 LAB — T4, FREE: Free T4: 1.22 ng/dL (ref 0.80–1.80)

## 2010-03-23 LAB — TSH: TSH: 2.602 u[IU]/mL (ref 0.350–4.500)

## 2010-03-23 NOTE — Progress Notes (Signed)
Summary: arranged surgery    Phone Note Call from Patient Call back at Home Phone [phone]   Caller: Patient-[phone]- work #  Reason for Call: Talk to Nurse Summary of Call: arranged surgery date.  Initial call taken by: Lorne Skeens,  March 12, 2010 9:33 AM  Follow-up for Phone Call        called pt back and she gave me her work # to call her on Mon to schedule the afib ablation Dennis Bast, RN, BSN  March 12, 2010 5:31 PM pt aware of date and time Dennis Bast, RN, BSN  March 15, 2010 2:57 PM  Merita Norton to schedule the CT of heart Dennis Bast, RN, BSN  March 15, 2010 3:03 PM

## 2010-03-23 NOTE — Letter (Signed)
Summary: Generic Letter  Architectural technologist, Main Office  [address]   [place], [place] [address]   Phone: [phone]  Fax: [phone]        March 15, 2010 MRN: [id number]    [facility] [address] [place], [place]  [address]    Dear Ms. Stabler,    Dr. Johney Frame has requested you to have a CARDIAC CT ,scheduled for  04/05/10 at 2pm.   DO NOT EAT OR DRINK ANYTHING AFTER 10AM, THE MORNING OF YOUR PROCEDURE.  PLEASE ARRIVE 1 HOUR PRIOR TO APPOINTMENT TIME,AT THE [place] OUT-PATIENT ADMISSION OFFICE LOCATED ON THE FIRST FLOOR NEAR THE GIFT SHOP.    Sincerely,   Merita Norton Lloyd-Fate

## 2010-03-25 LAB — PROTIME-INR: Prothrombin Time: 26.6 seconds — ABNORMAL HIGH (ref 11.6–15.2)

## 2010-03-30 ENCOUNTER — Encounter: Payer: Self-pay | Admitting: *Deleted

## 2010-03-30 NOTE — Letter (Signed)
Summary: [facility]: Blood Pressure Record  [facility]: Blood Pressure Record   Imported By: Earl Many 03/18/2010 15:24:46  _____________________________________________________________________  External Attachment:    Type:   Image     Comment:   External Document

## 2010-04-01 ENCOUNTER — Encounter: Payer: Self-pay | Admitting: Internal Medicine

## 2010-04-05 ENCOUNTER — Other Ambulatory Visit: Payer: Self-pay | Admitting: Cardiovascular Disease

## 2010-04-05 ENCOUNTER — Ambulatory Visit (HOSPITAL_COMMUNITY)
Admission: RE | Admit: 2010-04-05 | Discharge: 2010-04-05 | Disposition: A | Discharge status: Home / Self Care | Payer: Medicare Other | Source: Ambulatory Visit | Attending: Cardiovascular Disease | Admitting: Cardiovascular Disease

## 2010-04-05 ENCOUNTER — Telehealth: Payer: Self-pay | Admitting: Internal Medicine

## 2010-04-05 ENCOUNTER — Other Ambulatory Visit (INDEPENDENT_AMBULATORY_CARE_PROVIDER_SITE_OTHER): Payer: Medicare Other | Admitting: *Deleted

## 2010-04-05 ENCOUNTER — Ambulatory Visit (HOSPITAL_COMMUNITY): Admission: RE | Admit: 2010-04-05 | Payer: Medicare Other | Source: Ambulatory Visit

## 2010-04-05 DIAGNOSIS — I4891 Unspecified atrial fibrillation: Secondary | ICD-10-CM

## 2010-04-05 DIAGNOSIS — R079 Chest pain, unspecified: Secondary | ICD-10-CM | POA: Insufficient documentation

## 2010-04-05 LAB — CBC WITH DIFFERENTIAL/PLATELET
Basophils Absolute: 0 10*3/uL (ref 0.0–0.1)
Eosinophils Absolute: 0.2 10*3/uL (ref 0.0–0.7)
Eosinophils Relative: 4.1 % (ref 0.0–5.0)
HCT: 38.5 % (ref 36.0–46.0)
Lymphs Abs: 1.5 10*3/uL (ref 0.7–4.0)
MCHC: 34 g/dL (ref 30.0–36.0)
MCV: 89.6 fl (ref 78.0–100.0)
Monocytes Absolute: 0.4 10*3/uL (ref 0.1–1.0)
Neutrophils Relative %: 61.5 % (ref 43.0–77.0)
Platelets: 283 10*3/uL (ref 150.0–400.0)
RDW: 14.3 % (ref 11.5–14.6)
WBC: 5.7 10*3/uL (ref 4.5–10.5)

## 2010-04-05 LAB — BASIC METABOLIC PANEL
BUN: 20 mg/dL (ref 6–23)
CO2: 26 mEq/L (ref 19–32)
Chloride: 103 mEq/L (ref 96–112)
Creatinine, Ser: 1.1 mg/dL (ref 0.4–1.2)
Glucose, Bld: 115 mg/dL — ABNORMAL HIGH (ref 70–99)
Potassium: 4.1 mEq/L (ref 3.5–5.1)

## 2010-04-05 LAB — PROTIME-INR: Prothrombin Time: 31 s — ABNORMAL HIGH (ref 10.2–12.4)

## 2010-04-05 LAB — APTT: aPTT: 38.8 s — ABNORMAL HIGH (ref 21.7–28.8)

## 2010-04-05 MED ORDER — IOHEXOL 350 MG/ML SOLN
80.0000 mL | Freq: Once | INTRAVENOUS | Status: AC | PRN
  Administered 2010-04-05: 80 mL via INTRAVENOUS

## 2010-04-05 NOTE — Telephone Encounter (Signed)
Pt is coming today for blood work. Pt needs orders to be place.

## 2010-04-06 LAB — POCT I-STAT, CHEM 8
Calcium, Ion: 1.2 mmol/L (ref 1.12–1.32)
Creatinine, Ser: 1.2 mg/dL (ref 0.4–1.2)
Glucose, Bld: 96 mg/dL (ref 70–99)
HCT: 44 % (ref 36.0–46.0)
Hemoglobin: 15 g/dL (ref 12.0–15.0)
Potassium: 3.8 mEq/L (ref 3.5–5.1)

## 2010-04-06 NOTE — Telephone Encounter (Signed)
Pt calling re ablation 4-3, will he be going through her groin?

## 2010-04-06 NOTE — Telephone Encounter (Signed)
Yes he will be going in groin and she will be staying Tues night in the hospital

## 2010-04-07 ENCOUNTER — Telehealth: Payer: Self-pay | Admitting: Internal Medicine

## 2010-04-07 NOTE — Telephone Encounter (Signed)
Pt has question re her procedure on the April 13, 2010. Pt does not know what she needs to do.

## 2010-04-07 NOTE — Telephone Encounter (Signed)
Pt was mailed her instructions  She has misplaced them  I went over all instructions with her again She takes Coumadin 5mg  on M/W and 2.5mg  all other days will call her if she needs to hold prior to ablation

## 2010-04-12 ENCOUNTER — Ambulatory Visit (HOSPITAL_COMMUNITY)
Admission: RE | Admit: 2010-04-12 | Discharge: 2010-04-12 | Disposition: A | Discharge status: Home / Self Care | Payer: Medicare Other | Source: Ambulatory Visit | Attending: Cardiology | Admitting: Cardiology

## 2010-04-12 DIAGNOSIS — I359 Nonrheumatic aortic valve disorder, unspecified: Secondary | ICD-10-CM | POA: Insufficient documentation

## 2010-04-12 DIAGNOSIS — I4891 Unspecified atrial fibrillation: Secondary | ICD-10-CM | POA: Insufficient documentation

## 2010-04-12 DIAGNOSIS — I079 Rheumatic tricuspid valve disease, unspecified: Secondary | ICD-10-CM | POA: Insufficient documentation

## 2010-04-12 DIAGNOSIS — I379 Nonrheumatic pulmonary valve disorder, unspecified: Secondary | ICD-10-CM | POA: Insufficient documentation

## 2010-04-12 DIAGNOSIS — Z01810 Encounter for preprocedural cardiovascular examination: Secondary | ICD-10-CM | POA: Insufficient documentation

## 2010-04-13 ENCOUNTER — Ambulatory Visit (HOSPITAL_COMMUNITY): Admission: RE | Admit: 2010-04-13 | Payer: Medicare Other | Source: Ambulatory Visit | Admitting: Internal Medicine

## 2010-04-21 ENCOUNTER — Encounter (INDEPENDENT_AMBULATORY_CARE_PROVIDER_SITE_OTHER): Payer: Medicare Other | Admitting: Cardiothoracic Surgery

## 2010-04-21 DIAGNOSIS — I4891 Unspecified atrial fibrillation: Secondary | ICD-10-CM

## 2010-04-21 DIAGNOSIS — I059 Rheumatic mitral valve disease, unspecified: Secondary | ICD-10-CM

## 2010-04-23 ENCOUNTER — Other Ambulatory Visit (HOSPITAL_COMMUNITY): Payer: Medicare Other

## 2010-04-23 ENCOUNTER — Encounter (HOSPITAL_COMMUNITY): Payer: Medicare Other

## 2010-04-23 NOTE — Consult Note (Signed)
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  HISTORY OF PRESENT ILLNESS:  I was asked to evaluate this 70 year old Caucasian female ex-smoker for possible mitral valve repair - replacement with combined by - atrial maze procedure for recurrent atrial fibrillation - flutter with AV block.  The patient was noted to be in atrial fibrillation approximately 2 years ago by her primary care physician Dr. Tedra Senegal and was placed on Coumadin and Cardiology evaluation was performed by Dr. Mayford Knife.  A 2-D echo showed preserved LV function with mild mitral regurgitation, mild aortic insufficiency.  The patient was stable, but then developed some dyspnea on exertion and she underwent the evaluation and subsequent percutaneous atrial ablation for atrial fibrillation by Dr. Johney Frame in November 2011.  She developed recurrent atrial flutter and was cardioverted in January 2012.  She has remained in atrial flutter despite sotalol and amiodarone.  She was planned for a repeat ablation procedure and a TEE was performed by Dr. Mayford Knife which showed an increased amount of mitral regurgitation from her previous echo now graded at moderate MR as well as her baseline mild aortic insufficiency.  Because of the increased in severity of her mitral regurgitation as well as the persistent atrial fibrillation - flutter following a percutaneous ablation procedure, a cardiothoracic surgical evaluation was requested.  In addition, the patient remains with class III CHF with dyspnea on exertion after walking from a park car to her part-time job at the [employer].  She has also experienced some orthopnea in the past 6 weeks.  The patient denies any prior cardiac  problems, history of murmur, history of rheumatic heart disease as a child.  She has undergone cardiac cath in the past by Dr. Mayford Knife for atypical chest pain, over 5 years ago, and this apparently showed no significant disease.  The patient does have a strong family history of coronary artery disease and her father underwent bypass surgery in the 1970s and subsequently died of a heart disease.  She has been on Coumadin for over 2 years and has had no significant untoward events or complications from the Coumadin therapy.  She has had no history of thromboembolic complication from the atrial fibrillation - flutter.  PAST MEDICAL HISTORY: 1. Hypertension. 2. Dyslipidemia. 3. GERD. 4. Moderate mitral regurgitation on recent TEE. 5. History of paroxysmal atrial fibrillation, on Coumadin. 6. Degenerative arthritis of the cervical spine, status post epidural     steroid injections. 7. Intolerant to CODEINE due to nausea, vomiting. 8. Allergic to IODINE.  HOME MEDICATIONS: 1. Lipitor. 2. Protonix 40 mg daily. 3. Catapres patch 0.2 mg weekly. 4. Coumadin 5 mg alternating with 2.5 mg. 5. Potassium chloride 20 mEq a day. 6. Coreg 12.5 mg b.i.d. 7. Amiodarone 200 mg a day. 8. Flonase 2 sprays p.r.n. 9. Cardizem CD 180 mg a day. 10.Tylenol p.r.n. pain.  SOCIAL HISTORY:  The patient is retired, but works part-time at the [employer] at [employer] 3-4 days a week.  She is single, lives alone.  She stopped smoking over 25 years ago and does not drink alcohol.  FAMILY  HISTORY:  Strong family history for diabetes, strong family history of coronary artery disease.  No family history of cardiac valve disease or surgery.  SURGICAL HISTORY:  Umbilical hernia repair x2, cholecystectomy, bilateral shoulder surgery, hysterectomy, left knee surgery.  REVIEW OF SYSTEMS:  CONSTITUTIONAL:  Review is positive for an intentional 15-pound weight loss over the past 3 months.  No fever or night  sweats.  ENT:  Review is negative for change in vision.  She has not seen a dentist in over a year and has no dental complaints or difficulty swallowing.  THORACIC:  Review is negative for history of chest trauma, pneumothorax or abnormal chest x-ray, no recent history of upper respiratory tract infection or bronchitis.  CARDIAC:  Review is positive for mitral regurgitation from a possible restriction of the posterior leaflet with also a component of central MR from probable annular dilatation.  She has planned to undergo cardiac catheterization by Dr. Mayford Knife on April 27, 2010, to rule out coronary artery disease. GI:  Review is positive for her cholecystectomy GERD, previous hysterectomy.  No recent history of jaundice or blood per rectum. UROLOGIC:  Review is negative for kidney stones or symptoms of UTI. VASCULAR:  Review is negative for DVT, claudication or TIA.  NEUROLOGIC: Review is negative stroke or seizure.  HEMATOLOGIC:  Review is negative for bleeding disorders or blood transfusion, no adverse effects on Coumadin noted.  PHYSICAL EXAMINATION:  VITAL SIGNS:  The patient is 5  feet 5 inches, weighs 165 pounds.  Blood pressure 170/80, pulse 60, respirations 16, saturation 97%.  GENERAL:  She is alert and pleasant.  HEENT:  Exam is normocephalic.  Dentition appears to be adequate.  NECK:  Without JVD, mass or carotid bruit.  LYMPHATICS:  Show no palpable cervical or supraclavicular adenopathy.  Breath sounds are clear.  There is no thoracic deformity.  CARDIAC:  Exam reveals a grade 2-3/6 systolic murmur in the right sternal border and a grade 2/6 systolic murmur in the left axilla.  There is no gallop and her heart rate is irregular. ABDOMINAL EXAM:  Soft, nontender without pulsatile mass.  EXTREMITIES: No clubbing, cyanosis or edema.  Peripheral pulses are intact in all extremities.  There is no evidence of venous insufficiency of the lower extremities.  SKIN:  Without rash or  lesion.  NEUROLOGIC:  She is alert, oriented without focal motor deficit.  She has a normal gait.  LABORATORY DATA:  I reviewed her recent TEE, her last chest x-ray, and her medical records.  She has class III CHF from increasing mitral regurgitation now felt to be moderate as well as refractory atrial fibrillation despite a percutaneous radiofrequency ablation procedure in November 2011.  The patient certainly needs a cardiac cath include coronary arteriography and right heart pressures.  I think it would be reasonable to treat this patient with combined mitral valve repair and surgical maze procedure due to her significant problems with atrial fibrillation and increasing amount of mitral regurgitation on serial echoes.  I will follow up with the patient after cardiac cath on April 27, 2010, to discuss surgery and time of surgery.  She was encouraged to see her dentist for dental exam as soon as possible.  Thank you for this consultation.  Kerin Perna, M.D. Electronically Signed  PV/MEDQ  D:  04/21/2010  T:  04/22/2010  Job:  [id number]  cc:   Armanda Magic, M.D. Hillis Range, MD Sigmund Hazel, M.D.

## 2010-04-27 ENCOUNTER — Inpatient Hospital Stay (HOSPITAL_COMMUNITY): Admission: AD | Admit: 2010-04-27 | Payer: Self-pay | Source: Ambulatory Visit | Admitting: Cardiology

## 2010-04-27 ENCOUNTER — Inpatient Hospital Stay (HOSPITAL_BASED_OUTPATIENT_CLINIC_OR_DEPARTMENT_OTHER)
Admission: RE | Admit: 2010-04-27 | Discharge: 2010-04-27 | Disposition: A | Discharge status: Hospice | Payer: Medicare Other | Source: Ambulatory Visit | Attending: Cardiology | Admitting: Cardiology

## 2010-04-27 ENCOUNTER — Other Ambulatory Visit (HOSPITAL_COMMUNITY): Payer: Medicare Other

## 2010-04-27 ENCOUNTER — Encounter (HOSPITAL_COMMUNITY): Payer: Medicare Other

## 2010-04-27 ENCOUNTER — Inpatient Hospital Stay (HOSPITAL_COMMUNITY)
Admission: AD | Admit: 2010-04-27 | Discharge: 2010-05-20 | DRG: 220 | Disposition: A | Discharge status: Home Health | Payer: Medicare Other | Source: Ambulatory Visit | Attending: Cardiothoracic Surgery | Admitting: Cardiothoracic Surgery

## 2010-04-27 DIAGNOSIS — E785 Hyperlipidemia, unspecified: Secondary | ICD-10-CM | POA: Diagnosis present

## 2010-04-27 DIAGNOSIS — I4891 Unspecified atrial fibrillation: Secondary | ICD-10-CM | POA: Insufficient documentation

## 2010-04-27 DIAGNOSIS — I509 Heart failure, unspecified: Secondary | ICD-10-CM | POA: Diagnosis present

## 2010-04-27 DIAGNOSIS — Z7901 Long term (current) use of anticoagulants: Secondary | ICD-10-CM

## 2010-04-27 DIAGNOSIS — I5189 Other ill-defined heart diseases: Secondary | ICD-10-CM | POA: Diagnosis present

## 2010-04-27 DIAGNOSIS — K449 Diaphragmatic hernia without obstruction or gangrene: Secondary | ICD-10-CM | POA: Diagnosis present

## 2010-04-27 DIAGNOSIS — I10 Essential (primary) hypertension: Secondary | ICD-10-CM | POA: Diagnosis present

## 2010-04-27 DIAGNOSIS — D62 Acute posthemorrhagic anemia: Secondary | ICD-10-CM | POA: Diagnosis not present

## 2010-04-27 DIAGNOSIS — R5381 Other malaise: Secondary | ICD-10-CM | POA: Diagnosis not present

## 2010-04-27 DIAGNOSIS — I059 Rheumatic mitral valve disease, unspecified: Secondary | ICD-10-CM | POA: Insufficient documentation

## 2010-04-27 DIAGNOSIS — R1310 Dysphagia, unspecified: Secondary | ICD-10-CM | POA: Diagnosis not present

## 2010-04-27 DIAGNOSIS — Z01818 Encounter for other preprocedural examination: Secondary | ICD-10-CM | POA: Insufficient documentation

## 2010-04-27 DIAGNOSIS — J3801 Paralysis of vocal cords and larynx, unilateral: Secondary | ICD-10-CM | POA: Diagnosis not present

## 2010-04-27 DIAGNOSIS — Z01810 Encounter for preprocedural cardiovascular examination: Secondary | ICD-10-CM

## 2010-04-27 DIAGNOSIS — M899 Disorder of bone, unspecified: Secondary | ICD-10-CM | POA: Diagnosis present

## 2010-04-27 DIAGNOSIS — I080 Rheumatic disorders of both mitral and aortic valves: Principal | ICD-10-CM | POA: Diagnosis present

## 2010-04-27 DIAGNOSIS — D696 Thrombocytopenia, unspecified: Secondary | ICD-10-CM | POA: Diagnosis not present

## 2010-04-27 DIAGNOSIS — I5030 Unspecified diastolic (congestive) heart failure: Secondary | ICD-10-CM | POA: Diagnosis present

## 2010-04-27 DIAGNOSIS — K219 Gastro-esophageal reflux disease without esophagitis: Secondary | ICD-10-CM | POA: Diagnosis present

## 2010-04-27 DIAGNOSIS — Z79899 Other long term (current) drug therapy: Secondary | ICD-10-CM

## 2010-04-27 DIAGNOSIS — M479 Spondylosis, unspecified: Secondary | ICD-10-CM | POA: Diagnosis present

## 2010-04-27 DIAGNOSIS — I4892 Unspecified atrial flutter: Secondary | ICD-10-CM | POA: Diagnosis present

## 2010-04-27 DIAGNOSIS — E876 Hypokalemia: Secondary | ICD-10-CM | POA: Diagnosis not present

## 2010-04-27 DIAGNOSIS — B370 Candidal stomatitis: Secondary | ICD-10-CM | POA: Diagnosis not present

## 2010-04-27 DIAGNOSIS — Z981 Arthrodesis status: Secondary | ICD-10-CM

## 2010-04-27 HISTORY — PX: CARDIAC CATHETERIZATION: SHX172

## 2010-04-27 LAB — POCT I-STAT 3, VENOUS BLOOD GAS (G3P V)
O2 Saturation: 68 %
TCO2: 25 mmol/L (ref 0–100)
pH, Ven: 7.375 — ABNORMAL HIGH (ref 7.250–7.300)
pH, Ven: 7.388 — ABNORMAL HIGH (ref 7.250–7.300)

## 2010-04-27 LAB — CBC
HCT: 35 % — ABNORMAL LOW (ref 36.0–46.0)
Hemoglobin: 11.7 g/dL — ABNORMAL LOW (ref 12.0–15.0)
RBC: 3.98 MIL/uL (ref 3.87–5.11)

## 2010-04-27 LAB — COMPREHENSIVE METABOLIC PANEL
ALT: 24 U/L (ref 0–35)
AST: 23 U/L (ref 0–37)
Albumin: 3.5 g/dL (ref 3.5–5.2)
Calcium: 8.8 mg/dL (ref 8.4–10.5)
GFR calc Af Amer: >60 mL/min (ref >60)
Sodium: 137 mEq/L (ref 135–145)
Total Protein: 6.5 g/dL (ref 6.0–8.3)

## 2010-04-27 LAB — POCT I-STAT 3, ART BLOOD GAS (G3+)
Bicarbonate: 24.2 mEq/L — ABNORMAL HIGH (ref 20.0–24.0)
O2 Saturation: 95 %
TCO2: 25 mmol/L (ref 0–100)
pCO2 arterial: 37.1 mmHg (ref 35.0–45.0)
pH, Arterial: 7.423 — ABNORMAL HIGH (ref 7.350–7.400)

## 2010-04-27 LAB — APTT: aPTT: 29 seconds (ref 24–37)

## 2010-04-27 LAB — PROTIME-INR
INR: 1.2 (ref 0.00–1.49)
Prothrombin Time: 15.4 seconds — ABNORMAL HIGH (ref 11.6–15.2)

## 2010-04-28 ENCOUNTER — Inpatient Hospital Stay (HOSPITAL_COMMUNITY): Payer: Medicare Other

## 2010-04-28 DIAGNOSIS — I4891 Unspecified atrial fibrillation: Secondary | ICD-10-CM

## 2010-04-28 DIAGNOSIS — I059 Rheumatic mitral valve disease, unspecified: Secondary | ICD-10-CM

## 2010-04-28 LAB — BASIC METABOLIC PANEL
BUN: 13 mg/dL (ref 6–23)
CO2: 28 mEq/L (ref 19–32)
Calcium: 8.7 mg/dL (ref 8.4–10.5)
Chloride: 106 mEq/L (ref 96–112)
Creatinine, Ser: 1.06 mg/dL (ref 0.4–1.2)
GFR calc Af Amer: >60 mL/min (ref >60)
GFR calc non Af Amer: 51 mL/min — ABNORMAL LOW (ref >60)
Glucose, Bld: 98 mg/dL (ref 70–99)
Potassium: 3.3 mEq/L — ABNORMAL LOW (ref 3.5–5.1)
Sodium: 139 mEq/L (ref 135–145)

## 2010-04-28 LAB — HEMOGLOBIN A1C
Hgb A1c MFr Bld: 6.1 % — ABNORMAL HIGH (ref <5.7)
Mean Plasma Glucose: 128 mg/dL — ABNORMAL HIGH (ref <117)

## 2010-04-28 LAB — ABO/RH: ABO/RH(D): A POS

## 2010-04-29 ENCOUNTER — Inpatient Hospital Stay (HOSPITAL_COMMUNITY): Payer: Medicare Other

## 2010-04-29 ENCOUNTER — Inpatient Hospital Stay (HOSPITAL_COMMUNITY): Admission: RE | Admit: 2010-04-29 | Payer: Medicare Other | Source: Ambulatory Visit | Admitting: Cardiothoracic Surgery

## 2010-04-29 ENCOUNTER — Other Ambulatory Visit: Payer: Self-pay | Admitting: Cardiothoracic Surgery

## 2010-04-29 DIAGNOSIS — I4891 Unspecified atrial fibrillation: Secondary | ICD-10-CM

## 2010-04-29 DIAGNOSIS — I359 Nonrheumatic aortic valve disorder, unspecified: Secondary | ICD-10-CM

## 2010-04-29 DIAGNOSIS — I700 Atherosclerosis of aorta: Secondary | ICD-10-CM

## 2010-04-29 DIAGNOSIS — I059 Rheumatic mitral valve disease, unspecified: Secondary | ICD-10-CM

## 2010-04-29 HISTORY — PX: AORTIC VALVE REPLACEMENT: SHX41

## 2010-04-29 HISTORY — PX: COX-MAZE MICROWAVE ABLATION: SHX1404

## 2010-04-29 HISTORY — PX: MITRAL VALVE REPAIR: SHX2039

## 2010-04-29 LAB — CBC
HCT: 35.4 % — ABNORMAL LOW (ref 36.0–46.0)
Hemoglobin: 12.5 g/dL (ref 12.0–15.0)
MCH: 29.8 pg (ref 26.0–34.0)
MCHC: 35.3 g/dL (ref 30.0–36.0)
MCV: 84.5 fL (ref 78.0–100.0)
Platelets: 100 10*3/uL — ABNORMAL LOW (ref 150–400)
RBC: 4.19 MIL/uL (ref 3.87–5.11)
RDW: 14.3 % (ref 11.5–15.5)
WBC: 13.4 10*3/uL — ABNORMAL HIGH (ref 4.0–10.5)

## 2010-04-29 LAB — POCT I-STAT 3, ART BLOOD GAS (G3+)
Acid-Base Excess: 1 mmol/L (ref 0.0–2.0)
Acid-base deficit: 1 mmol/L (ref 0.0–2.0)
Acid-base deficit: 7 mmol/L — ABNORMAL HIGH (ref 0.0–2.0)
Acid-base deficit: 2 mmol/L (ref 0.0–2.0)
Acid-base deficit: 3 mmol/L — ABNORMAL HIGH (ref 0.0–2.0)
Acid-base deficit: 5 mmol/L — ABNORMAL HIGH (ref 0.0–2.0)
Bicarbonate: 23 mEq/L (ref 20.0–24.0)
Bicarbonate: 18.6 mEq/L — ABNORMAL LOW (ref 20.0–24.0)
Bicarbonate: 22.2 mEq/L (ref 20.0–24.0)
Bicarbonate: 23 mEq/L (ref 20.0–24.0)
Bicarbonate: 20.2 mEq/L (ref 20.0–24.0)
Bicarbonate: 25.8 mEq/L — ABNORMAL HIGH (ref 20.0–24.0)
O2 Saturation: 100 %
O2 Saturation: 100 %
O2 Saturation: 100 %
O2 Saturation: 90 %
O2 Saturation: 96 %
O2 Saturation: 99 %
Patient temperature: 35.4
Patient temperature: 36.2
TCO2: 24 mmol/L (ref 0–100)
TCO2: 20 mmol/L (ref 0–100)
TCO2: 23 mmol/L (ref 0–100)
TCO2: 24 mmol/L (ref 0–100)
TCO2: 21 mmol/L (ref 0–100)
TCO2: 27 mmol/L (ref 0–100)
pCO2 arterial: 34.8 mmHg — ABNORMAL LOW (ref 35.0–45.0)
pCO2 arterial: 36.5 mmHg (ref 35.0–45.0)
pCO2 arterial: 36 mmHg (ref 35.0–45.0)
pCO2 arterial: 43.4 mmHg (ref 35.0–45.0)
pCO2 arterial: 36.1 mmHg (ref 35.0–45.0)
pCO2 arterial: 40.9 mmHg (ref 35.0–45.0)
pH, Arterial: 7.428 — ABNORMAL HIGH (ref 7.350–7.400)
pH, Arterial: 7.316 — ABNORMAL LOW (ref 7.350–7.400)
pH, Arterial: 7.398 (ref 7.350–7.400)
pH, Arterial: 7.331 — ABNORMAL LOW (ref 7.350–7.400)
pH, Arterial: 7.349 — ABNORMAL LOW (ref 7.350–7.400)
pH, Arterial: 7.405 — ABNORMAL HIGH (ref 7.350–7.400)
pO2, Arterial: 361 mmHg — ABNORMAL HIGH (ref 80.0–100.0)
pO2, Arterial: 364 mmHg — ABNORMAL HIGH (ref 80.0–100.0)
pO2, Arterial: 177 mmHg — ABNORMAL HIGH (ref 80.0–100.0)
pO2, Arterial: 63 mmHg — ABNORMAL LOW (ref 80.0–100.0)
pO2, Arterial: 80 mmHg (ref 80.0–100.0)
pO2, Arterial: 124 mmHg — ABNORMAL HIGH (ref 80.0–100.0)

## 2010-04-29 LAB — HEMOGLOBIN AND HEMATOCRIT, BLOOD
HCT: 24.6 % — ABNORMAL LOW (ref 36.0–46.0)
HCT: 33 % — ABNORMAL LOW (ref 36.0–46.0)
Hemoglobin: 8.7 g/dL — ABNORMAL LOW (ref 12.0–15.0)
Hemoglobin: 11.6 g/dL — ABNORMAL LOW (ref 12.0–15.0)

## 2010-04-29 LAB — POCT I-STAT 4, (NA,K, GLUC, HGB,HCT)
Glucose, Bld: 136 mg/dL — ABNORMAL HIGH (ref 70–99)
Glucose, Bld: 117 mg/dL — ABNORMAL HIGH (ref 70–99)
Glucose, Bld: 97 mg/dL (ref 70–99)
Glucose, Bld: 140 mg/dL — ABNORMAL HIGH (ref 70–99)
Glucose, Bld: 117 mg/dL — ABNORMAL HIGH (ref 70–99)
Glucose, Bld: 114 mg/dL — ABNORMAL HIGH (ref 70–99)
Glucose, Bld: 139 mg/dL — ABNORMAL HIGH (ref 70–99)
Glucose, Bld: 135 mg/dL — ABNORMAL HIGH (ref 70–99)
Glucose, Bld: 122 mg/dL — ABNORMAL HIGH (ref 70–99)
Glucose, Bld: 146 mg/dL — ABNORMAL HIGH (ref 70–99)
Glucose, Bld: 98 mg/dL (ref 70–99)
HCT: 34 % — ABNORMAL LOW (ref 36.0–46.0)
HCT: 29 % — ABNORMAL LOW (ref 36.0–46.0)
HCT: 23 % — ABNORMAL LOW (ref 36.0–46.0)
HCT: 24 % — ABNORMAL LOW (ref 36.0–46.0)
HCT: 25 % — ABNORMAL LOW (ref 36.0–46.0)
HCT: 23 % — ABNORMAL LOW (ref 36.0–46.0)
HCT: 19 % — ABNORMAL LOW (ref 36.0–46.0)
HCT: 31 % — ABNORMAL LOW (ref 36.0–46.0)
HCT: 20 % — ABNORMAL LOW (ref 36.0–46.0)
HCT: 33 % — ABNORMAL LOW (ref 36.0–46.0)
HCT: 33 % — ABNORMAL LOW (ref 36.0–46.0)
Hemoglobin: 11.6 g/dL — ABNORMAL LOW (ref 12.0–15.0)
Hemoglobin: 9.9 g/dL — ABNORMAL LOW (ref 12.0–15.0)
Hemoglobin: 7.8 g/dL — ABNORMAL LOW (ref 12.0–15.0)
Hemoglobin: 8.2 g/dL — ABNORMAL LOW (ref 12.0–15.0)
Hemoglobin: 8.5 g/dL — ABNORMAL LOW (ref 12.0–15.0)
Hemoglobin: 7.8 g/dL — ABNORMAL LOW (ref 12.0–15.0)
Hemoglobin: 6.5 g/dL — CL (ref 12.0–15.0)
Hemoglobin: 10.5 g/dL — ABNORMAL LOW (ref 12.0–15.0)
Hemoglobin: 6.8 g/dL — CL (ref 12.0–15.0)
Hemoglobin: 11.2 g/dL — ABNORMAL LOW (ref 12.0–15.0)
Hemoglobin: 11.2 g/dL — ABNORMAL LOW (ref 12.0–15.0)
Potassium: 3.5 mEq/L (ref 3.5–5.1)
Potassium: 3.6 mEq/L (ref 3.5–5.1)
Potassium: 3.4 mEq/L — ABNORMAL LOW (ref 3.5–5.1)
Potassium: 4.9 mEq/L (ref 3.5–5.1)
Potassium: 5.5 mEq/L — ABNORMAL HIGH (ref 3.5–5.1)
Potassium: 4.9 mEq/L (ref 3.5–5.1)
Potassium: 3.8 mEq/L (ref 3.5–5.1)
Potassium: 5.2 mEq/L — ABNORMAL HIGH (ref 3.5–5.1)
Potassium: 4.4 mEq/L (ref 3.5–5.1)
Potassium: 3.8 mEq/L (ref 3.5–5.1)
Potassium: 3 mEq/L — ABNORMAL LOW (ref 3.5–5.1)
Sodium: 140 mEq/L (ref 135–145)
Sodium: 139 mEq/L (ref 135–145)
Sodium: 136 mEq/L (ref 135–145)
Sodium: 137 mEq/L (ref 135–145)
Sodium: 140 mEq/L (ref 135–145)
Sodium: 142 mEq/L (ref 135–145)
Sodium: 142 mEq/L (ref 135–145)
Sodium: 143 mEq/L (ref 135–145)
Sodium: 143 mEq/L (ref 135–145)
Sodium: 144 mEq/L (ref 135–145)
Sodium: 145 mEq/L (ref 135–145)

## 2010-04-29 LAB — DIC (DISSEMINATED INTRAVASCULAR COAGULATION)PANEL
D-Dimer, Quant: 1.17 ug/mL-FEU — ABNORMAL HIGH (ref 0.00–0.48)
Fibrinogen: 149 mg/dL — ABNORMAL LOW (ref 204–475)
INR: 0.97 (ref 0.00–1.49)
Platelets: 121 10*3/uL — ABNORMAL LOW (ref 150–400)
Prothrombin Time: 13.1 seconds (ref 11.6–15.2)
Smear Review: NONE SEEN
aPTT: 180 seconds — ABNORMAL HIGH (ref 24–37)

## 2010-04-29 LAB — PROTIME-INR
INR: 1.01 (ref 0.00–1.49)
Prothrombin Time: 13.5 seconds (ref 11.6–15.2)

## 2010-04-29 LAB — POCT I-STAT GLUCOSE
Glucose, Bld: 103 mg/dL — ABNORMAL HIGH (ref 70–99)
Glucose, Bld: 144 mg/dL — ABNORMAL HIGH (ref 70–99)
Operator id: 3342
Operator id: 3406

## 2010-04-29 LAB — PLATELET COUNT
Platelets: 113 10*3/uL — ABNORMAL LOW (ref 150–400)
Platelets: 51 10*3/uL — ABNORMAL LOW (ref 150–400)

## 2010-04-29 LAB — APTT: aPTT: 49 seconds — ABNORMAL HIGH (ref 24–37)

## 2010-04-30 ENCOUNTER — Inpatient Hospital Stay (HOSPITAL_COMMUNITY): Payer: Medicare Other

## 2010-04-30 DIAGNOSIS — I059 Rheumatic mitral valve disease, unspecified: Secondary | ICD-10-CM

## 2010-04-30 HISTORY — PX: OTHER SURGICAL HISTORY: SHX169

## 2010-04-30 LAB — POCT I-STAT, CHEM 8
BUN: 12 mg/dL (ref 6–23)
BUN: 11 mg/dL (ref 6–23)
Calcium, Ion: 1.03 mmol/L — ABNORMAL LOW (ref 1.12–1.32)
Calcium, Ion: 1.05 mmol/L — ABNORMAL LOW (ref 1.12–1.32)
Chloride: 107 mEq/L (ref 96–112)
Chloride: 107 mEq/L (ref 96–112)
Creatinine, Ser: 1.2 mg/dL (ref 0.4–1.2)
Creatinine, Ser: 1.1 mg/dL (ref 0.4–1.2)
Glucose, Bld: 112 mg/dL — ABNORMAL HIGH (ref 70–99)
Glucose, Bld: 140 mg/dL — ABNORMAL HIGH (ref 70–99)
HCT: 29 % — ABNORMAL LOW (ref 36.0–46.0)
HCT: 43 % (ref 36.0–46.0)
Hemoglobin: 9.9 g/dL — ABNORMAL LOW (ref 12.0–15.0)
Hemoglobin: 14.6 g/dL (ref 12.0–15.0)
Potassium: 4.1 mEq/L (ref 3.5–5.1)
Potassium: 3.6 mEq/L (ref 3.5–5.1)
Sodium: 145 mEq/L (ref 135–145)
Sodium: 144 mEq/L (ref 135–145)
TCO2: 25 mmol/L (ref 0–100)
TCO2: 25 mmol/L (ref 0–100)

## 2010-04-30 LAB — POCT I-STAT 3, ART BLOOD GAS (G3+)
Acid-Base Excess: 2 mmol/L (ref 0.0–2.0)
Acid-Base Excess: 3 mmol/L — ABNORMAL HIGH (ref 0.0–2.0)
Acid-Base Excess: 2 mmol/L (ref 0.0–2.0)
Bicarbonate: 26.9 mEq/L — ABNORMAL HIGH (ref 20.0–24.0)
Bicarbonate: 27.2 mEq/L — ABNORMAL HIGH (ref 20.0–24.0)
Bicarbonate: 26.3 mEq/L — ABNORMAL HIGH (ref 20.0–24.0)
O2 Saturation: 96 %
O2 Saturation: 98 %
O2 Saturation: 94 %
Patient temperature: 37.1
Patient temperature: 36.9
Patient temperature: 36.1
TCO2: 28 mmol/L (ref 0–100)
TCO2: 28 mmol/L (ref 0–100)
TCO2: 27 mmol/L (ref 0–100)
pCO2 arterial: 40.5 mmHg (ref 35.0–45.0)
pCO2 arterial: 38.4 mmHg (ref 35.0–45.0)
pCO2 arterial: 35.3 mmHg (ref 35.0–45.0)
pH, Arterial: 7.431 — ABNORMAL HIGH (ref 7.350–7.400)
pH, Arterial: 7.458 — ABNORMAL HIGH (ref 7.350–7.400)
pH, Arterial: 7.476 — ABNORMAL HIGH (ref 7.350–7.400)
pO2, Arterial: 83 mmHg (ref 80.0–100.0)
pO2, Arterial: 96 mmHg (ref 80.0–100.0)
pO2, Arterial: 63 mmHg — ABNORMAL LOW (ref 80.0–100.0)

## 2010-04-30 LAB — CBC
HCT: 26.8 % — ABNORMAL LOW (ref 36.0–46.0)
HCT: 24.3 % — ABNORMAL LOW (ref 36.0–46.0)
Hemoglobin: 9.8 g/dL — ABNORMAL LOW (ref 12.0–15.0)
Hemoglobin: 8.7 g/dL — ABNORMAL LOW (ref 12.0–15.0)
MCH: 30.4 pg (ref 26.0–34.0)
MCH: 30 pg (ref 26.0–34.0)
MCHC: 36.6 g/dL — ABNORMAL HIGH (ref 30.0–36.0)
MCHC: 35.8 g/dL (ref 30.0–36.0)
MCV: 83.2 fL (ref 78.0–100.0)
MCV: 83.8 fL (ref 78.0–100.0)
Platelets: 60 10*3/uL — ABNORMAL LOW (ref 150–400)
Platelets: 63 10*3/uL — ABNORMAL LOW (ref 150–400)
RBC: 3.22 MIL/uL — ABNORMAL LOW (ref 3.87–5.11)
RBC: 2.9 MIL/uL — ABNORMAL LOW (ref 3.87–5.11)
RDW: 14.3 % (ref 11.5–15.5)
RDW: 14.9 % (ref 11.5–15.5)
WBC: 9.1 10*3/uL (ref 4.0–10.5)
WBC: 10.2 10*3/uL (ref 4.0–10.5)

## 2010-04-30 LAB — PREPARE FRESH FROZEN PLASMA
Unit division: 0
Unit division: 0
Unit division: 0
Unit division: 0
Unit division: 0

## 2010-04-30 LAB — CARBOXYHEMOGLOBIN
Carboxyhemoglobin: 1.2 % (ref 0.5–1.5)
Methemoglobin: 0.8 % (ref 0.0–1.5)
O2 Saturation: 87 %
Total hemoglobin: 8.6 g/dL — ABNORMAL LOW (ref 12.5–16.0)

## 2010-04-30 LAB — BLOOD GAS, ARTERIAL
Acid-Base Excess: 1.4 mmol/L (ref 0.0–2.0)
Bicarbonate: 25.4 mEq/L — ABNORMAL HIGH (ref 20.0–24.0)
FIO2: 0.7 %
O2 Saturation: 98 %
pO2, Arterial: 97.6 mmHg (ref 80.0–100.0)

## 2010-04-30 LAB — MAGNESIUM: Magnesium: 3.2 mg/dL — ABNORMAL HIGH (ref 1.5–2.5)

## 2010-04-30 LAB — GLUCOSE, CAPILLARY
Glucose-Capillary: 110 mg/dL — ABNORMAL HIGH (ref 70–99)
Glucose-Capillary: 78 mg/dL (ref 70–99)
Glucose-Capillary: 85 mg/dL (ref 70–99)
Glucose-Capillary: 87 mg/dL (ref 70–99)
Glucose-Capillary: 90 mg/dL (ref 70–99)
Glucose-Capillary: 97 mg/dL (ref 70–99)
Glucose-Capillary: 98 mg/dL (ref 70–99)
Glucose-Capillary: 98 mg/dL (ref 70–99)
Glucose-Capillary: 102 mg/dL — ABNORMAL HIGH (ref 70–99)
Glucose-Capillary: 134 mg/dL — ABNORMAL HIGH (ref 70–99)

## 2010-04-30 LAB — PROTIME-INR
INR: 0.85 (ref 0.00–1.49)
INR: 0.94 (ref 0.00–1.49)
Prothrombin Time: 11.8 seconds (ref 11.6–15.2)
Prothrombin Time: 12.8 seconds (ref 11.6–15.2)

## 2010-04-30 LAB — BASIC METABOLIC PANEL
BUN: 9 mg/dL (ref 6–23)
CO2: 28 mEq/L (ref 19–32)
Calcium: 7.4 mg/dL — ABNORMAL LOW (ref 8.4–10.5)
Chloride: 112 mEq/L (ref 96–112)
Creatinine, Ser: 0.88 mg/dL (ref 0.4–1.2)
GFR calc Af Amer: >60 mL/min (ref >60)
GFR calc non Af Amer: >60 mL/min (ref >60)
Glucose, Bld: 99 mg/dL (ref 70–99)
Potassium: 3.8 mEq/L (ref 3.5–5.1)
Sodium: 145 mEq/L (ref 135–145)

## 2010-04-30 LAB — PREPARE CRYOPRECIPITATE
Unit division: 0
Unit division: 0

## 2010-04-30 LAB — MRSA PCR SCREENING: MRSA by PCR: NEGATIVE

## 2010-04-30 LAB — APTT
aPTT: 31 seconds (ref 24–37)
aPTT: 31 seconds (ref 24–37)

## 2010-05-01 ENCOUNTER — Inpatient Hospital Stay (HOSPITAL_COMMUNITY): Payer: Medicare Other

## 2010-05-01 LAB — CBC
HCT: 22.3 % — ABNORMAL LOW (ref 36.0–46.0)
Hemoglobin: 7.9 g/dL — ABNORMAL LOW (ref 12.0–15.0)
MCH: 29.9 pg (ref 26.0–34.0)
MCHC: 35.4 g/dL (ref 30.0–36.0)
MCV: 84.5 fL (ref 78.0–100.0)
Platelets: 58 10*3/uL — ABNORMAL LOW (ref 150–400)
RBC: 2.64 MIL/uL — ABNORMAL LOW (ref 3.87–5.11)
RDW: 15.2 % (ref 11.5–15.5)
WBC: 9.7 10*3/uL (ref 4.0–10.5)

## 2010-05-01 LAB — CARBOXYHEMOGLOBIN
Carboxyhemoglobin: 1.2 % (ref 0.5–1.5)
Methemoglobin: 1 % (ref 0.0–1.5)
O2 Saturation: 68.8 %
Total hemoglobin: 7.8 g/dL — ABNORMAL LOW (ref 12.5–16.0)

## 2010-05-01 LAB — COMPREHENSIVE METABOLIC PANEL
ALT: 22 U/L (ref 0–35)
AST: 48 U/L — ABNORMAL HIGH (ref 0–37)
Albumin: 2.5 g/dL — ABNORMAL LOW (ref 3.5–5.2)
Alkaline Phosphatase: 44 U/L (ref 39–117)
BUN: 12 mg/dL (ref 6–23)
CO2: 27 mEq/L (ref 19–32)
Calcium: 7.5 mg/dL — ABNORMAL LOW (ref 8.4–10.5)
Chloride: 113 mEq/L — ABNORMAL HIGH (ref 96–112)
Creatinine, Ser: 1 mg/dL (ref 0.4–1.2)
GFR calc Af Amer: >60 mL/min (ref >60)
GFR calc non Af Amer: 55 mL/min — ABNORMAL LOW (ref >60)
Glucose, Bld: 135 mg/dL — ABNORMAL HIGH (ref 70–99)
Potassium: 3.7 mEq/L (ref 3.5–5.1)
Sodium: 145 mEq/L (ref 135–145)
Total Bilirubin: 0.8 mg/dL (ref 0.3–1.2)
Total Protein: 4.4 g/dL — ABNORMAL LOW (ref 6.0–8.3)

## 2010-05-01 LAB — GLUCOSE, CAPILLARY
Glucose-Capillary: 113 mg/dL — ABNORMAL HIGH (ref 70–99)
Glucose-Capillary: 124 mg/dL — ABNORMAL HIGH (ref 70–99)
Glucose-Capillary: 124 mg/dL — ABNORMAL HIGH (ref 70–99)
Glucose-Capillary: 125 mg/dL — ABNORMAL HIGH (ref 70–99)
Glucose-Capillary: 127 mg/dL — ABNORMAL HIGH (ref 70–99)
Glucose-Capillary: 128 mg/dL — ABNORMAL HIGH (ref 70–99)
Glucose-Capillary: 131 mg/dL — ABNORMAL HIGH (ref 70–99)

## 2010-05-01 LAB — BLOOD GAS, ARTERIAL
Acid-Base Excess: 3.1 mmol/L — ABNORMAL HIGH (ref 0.0–2.0)
Bicarbonate: 26.1 mEq/L — ABNORMAL HIGH (ref 20.0–24.0)
FIO2: 0.4 %
MECHVT: 440 mL
O2 Saturation: 98.8 %
PEEP: 8 cmH2O
Patient temperature: 98.6
RATE: 18 resp/min
TCO2: 27.1 mmol/L (ref 0–100)
pCO2 arterial: 32.8 mmHg — ABNORMAL LOW (ref 35.0–45.0)
pH, Arterial: 7.511 — ABNORMAL HIGH (ref 7.350–7.400)
pO2, Arterial: 104 mmHg — ABNORMAL HIGH (ref 80.0–100.0)

## 2010-05-01 LAB — POCT I-STAT 7, (LYTES, BLD GAS, ICA,H+H)
Acid-Base Excess: 5 mmol/L — ABNORMAL HIGH (ref 0.0–2.0)
Bicarbonate: 28.1 mEq/L — ABNORMAL HIGH (ref 20.0–24.0)
Calcium, Ion: 0.99 mmol/L — ABNORMAL LOW (ref 1.12–1.32)
HCT: 24 % — ABNORMAL LOW (ref 36.0–46.0)
Hemoglobin: 8.2 g/dL — ABNORMAL LOW (ref 12.0–15.0)
O2 Saturation: 100 %
Potassium: 3.7 mEq/L (ref 3.5–5.1)
Sodium: 145 mEq/L (ref 135–145)
TCO2: 29 mmol/L (ref 0–100)
pCO2 arterial: 32.4 mmHg — ABNORMAL LOW (ref 35.0–45.0)
pH, Arterial: 7.546 — ABNORMAL HIGH (ref 7.350–7.400)
pO2, Arterial: 286 mmHg — ABNORMAL HIGH (ref 80.0–100.0)

## 2010-05-02 ENCOUNTER — Inpatient Hospital Stay (HOSPITAL_COMMUNITY): Payer: Medicare Other

## 2010-05-02 LAB — POCT I-STAT 4, (NA,K, GLUC, HGB,HCT)
Glucose, Bld: 129 mg/dL — ABNORMAL HIGH (ref 70–99)
Glucose, Bld: 126 mg/dL — ABNORMAL HIGH (ref 70–99)
HCT: 28 % — ABNORMAL LOW (ref 36.0–46.0)
HCT: 29 % — ABNORMAL LOW (ref 36.0–46.0)
Hemoglobin: 9.5 g/dL — ABNORMAL LOW (ref 12.0–15.0)
Hemoglobin: 9.9 g/dL — ABNORMAL LOW (ref 12.0–15.0)
Potassium: 3.2 mEq/L — ABNORMAL LOW (ref 3.5–5.1)
Potassium: 3.5 mEq/L (ref 3.5–5.1)
Sodium: 141 mEq/L (ref 135–145)
Sodium: 142 mEq/L (ref 135–145)

## 2010-05-02 LAB — CROSSMATCH
ABO/RH(D): A POS
Antibody Screen: NEGATIVE
Unit division: 0
Unit division: 0
Unit division: 0
Unit division: 0
Unit division: 0
Unit division: 0
Unit division: 0
Unit division: 0
Unit division: 0
Unit division: 0
Unit division: 0
Unit division: 0
Unit division: 0
Unit division: 0
Unit division: 0
Unit division: 0

## 2010-05-02 LAB — BASIC METABOLIC PANEL
BUN: 20 mg/dL (ref 6–23)
CO2: 25 mEq/L (ref 19–32)
Calcium: 7.7 mg/dL — ABNORMAL LOW (ref 8.4–10.5)
Chloride: 109 mEq/L (ref 96–112)
Creatinine, Ser: 1.15 mg/dL (ref 0.4–1.2)
GFR calc Af Amer: 56 mL/min — ABNORMAL LOW (ref >60)

## 2010-05-02 LAB — CULTURE, RESPIRATORY W GRAM STAIN

## 2010-05-02 LAB — GLUCOSE, CAPILLARY
Glucose-Capillary: 141 mg/dL — ABNORMAL HIGH (ref 70–99)
Glucose-Capillary: 139 mg/dL — ABNORMAL HIGH (ref 70–99)
Glucose-Capillary: 123 mg/dL — ABNORMAL HIGH (ref 70–99)
Glucose-Capillary: 139 mg/dL — ABNORMAL HIGH (ref 70–99)
Glucose-Capillary: 119 mg/dL — ABNORMAL HIGH (ref 70–99)

## 2010-05-02 LAB — CBC
MCH: 29.3 pg (ref 26.0–34.0)
MCHC: 34.2 g/dL (ref 30.0–36.0)
MCV: 85.8 fL (ref 78.0–100.0)
Platelets: 63 10*3/uL — ABNORMAL LOW (ref 150–400)
RBC: 3.31 MIL/uL — ABNORMAL LOW (ref 3.87–5.11)

## 2010-05-02 LAB — POCT I-STAT 3, ART BLOOD GAS (G3+)
Acid-Base Excess: 1 mmol/L (ref 0.0–2.0)
Acid-Base Excess: 3 mmol/L — ABNORMAL HIGH (ref 0.0–2.0)
Bicarbonate: 25 mEq/L — ABNORMAL HIGH (ref 20.0–24.0)
Bicarbonate: 28.3 mEq/L — ABNORMAL HIGH (ref 20.0–24.0)
O2 Saturation: 95 %
O2 Saturation: 90 %
Patient temperature: 37
Patient temperature: 37.4
TCO2: 26 mmol/L (ref 0–100)
TCO2: 30 mmol/L (ref 0–100)
pCO2 arterial: 37.6 mmHg (ref 35.0–45.0)
pCO2 arterial: 44.4 mmHg (ref 35.0–45.0)
pH, Arterial: 7.431 — ABNORMAL HIGH (ref 7.350–7.400)
pH, Arterial: 7.415 — ABNORMAL HIGH (ref 7.350–7.400)
pO2, Arterial: 75 mmHg — ABNORMAL LOW (ref 80.0–100.0)
pO2, Arterial: 59 mmHg — ABNORMAL LOW (ref 80.0–100.0)

## 2010-05-03 ENCOUNTER — Inpatient Hospital Stay (HOSPITAL_COMMUNITY): Payer: Medicare Other

## 2010-05-03 DIAGNOSIS — I4891 Unspecified atrial fibrillation: Secondary | ICD-10-CM

## 2010-05-03 LAB — POCT I-STAT 3, ART BLOOD GAS (G3+)
Acid-Base Excess: 4 mmol/L — ABNORMAL HIGH (ref 0.0–2.0)
Acid-Base Excess: 6 mmol/L — ABNORMAL HIGH (ref 0.0–2.0)
Bicarbonate: 28.4 mEq/L — ABNORMAL HIGH (ref 20.0–24.0)
Bicarbonate: 30.7 mEq/L — ABNORMAL HIGH (ref 20.0–24.0)
O2 Saturation: 94 %
O2 Saturation: 94 %
Patient temperature: 98
TCO2: 30 mmol/L (ref 0–100)
TCO2: 32 mmol/L (ref 0–100)
pCO2 arterial: 41 mmHg (ref 35.0–45.0)
pCO2 arterial: 42.9 mmHg (ref 35.0–45.0)
pH, Arterial: 7.449 — ABNORMAL HIGH (ref 7.350–7.400)
pH, Arterial: 7.461 — ABNORMAL HIGH (ref 7.350–7.400)
pO2, Arterial: 69 mmHg — ABNORMAL LOW (ref 80.0–100.0)
pO2, Arterial: 65 mmHg — ABNORMAL LOW (ref 80.0–100.0)

## 2010-05-03 LAB — POCT I-STAT 4, (NA,K, GLUC, HGB,HCT)
Glucose, Bld: 137 mg/dL — ABNORMAL HIGH (ref 70–99)
HCT: 31 % — ABNORMAL LOW (ref 36.0–46.0)
Hemoglobin: 10.5 g/dL — ABNORMAL LOW (ref 12.0–15.0)
Potassium: 3.4 mEq/L — ABNORMAL LOW (ref 3.5–5.1)
Sodium: 142 mEq/L (ref 135–145)

## 2010-05-03 LAB — GLUCOSE, CAPILLARY
Glucose-Capillary: 118 mg/dL — ABNORMAL HIGH (ref 70–99)
Glucose-Capillary: 166 mg/dL — ABNORMAL HIGH (ref 70–99)
Glucose-Capillary: 107 mg/dL — ABNORMAL HIGH (ref 70–99)
Glucose-Capillary: 113 mg/dL — ABNORMAL HIGH (ref 70–99)
Glucose-Capillary: 109 mg/dL — ABNORMAL HIGH (ref 70–99)

## 2010-05-03 LAB — POCT I-STAT, CHEM 8
BUN: 20 mg/dL (ref 6–23)
Calcium, Ion: 1.12 mmol/L (ref 1.12–1.32)
Chloride: 100 mEq/L (ref 96–112)
Creatinine, Ser: 1.2 mg/dL (ref 0.4–1.2)
Glucose, Bld: 115 mg/dL — ABNORMAL HIGH (ref 70–99)
HCT: 36 % (ref 36.0–46.0)
Hemoglobin: 12.2 g/dL (ref 12.0–15.0)
Potassium: 3.2 mEq/L — ABNORMAL LOW (ref 3.5–5.1)
Sodium: 137 mEq/L (ref 135–145)
TCO2: 31 mmol/L (ref 0–100)

## 2010-05-03 LAB — CBC
Platelets: 88 10*3/uL — ABNORMAL LOW (ref 150–400)
RBC: 3.39 MIL/uL — ABNORMAL LOW (ref 3.87–5.11)
RDW: 15.8 % — ABNORMAL HIGH (ref 11.5–15.5)
WBC: 15.8 10*3/uL — ABNORMAL HIGH (ref 4.0–10.5)

## 2010-05-03 LAB — BASIC METABOLIC PANEL
BUN: 14 mg/dL (ref 6–23)
GFR calc Af Amer: >60 mL/min (ref >60)
GFR calc non Af Amer: >60 mL/min (ref >60)
Potassium: 3.4 mEq/L — ABNORMAL LOW (ref 3.5–5.1)
Sodium: 140 mEq/L (ref 135–145)

## 2010-05-03 NOTE — H&P (Signed)
NAME:  Ashley [name], Ashley [name]               ACCOUNT NO.:  [id number]  MEDICAL RECORD NO.:  [id number]           PATIENT TYPE:  I  LOCATION:  2309                         FACILITY:  [facility]  PHYSICIAN:  Armanda Magic, M.D.     DATE OF BIRTH:  [birth year]  DATE OF ADMISSION:  04/27/2010 DATE OF DISCHARGE:                             HISTORY & PHYSICAL   REFERRING PHYSICIAN:  Sigmund Hazel, MD  CHIEF COMPLAINT:  Admission for mitral valve repair.  HISTORY OF PRESENT ILLNESS:  This is a very pleasant 70 year old female with a history of atrial fibrillation and flutter status post AFib ablation back in November 2011 with previous cardioversion in January 2012.  She has been in atrial flutter and has failed antiarrhythmic therapy including sotalol and amiodarone and was referred to Dr. Johney Frame for plans to proceed with repeat ablation for AFib/flutter.  She underwent TEE prior to A-flutter ablation and was found to have a markedly dilated left atrium at 6.3 cm with moderate-to-severe MR 3+ extending back to the posterior wall of the left atrium.  There was also question of possible ruptured chordae tendineae.  She presented today for cardiac catheterization prior to undergoing surgery.  Cardiac cath revealed normal coronary arteries and normal right heart pressures with increased V-waves and pulmonary capillary wedge tracing as well as severe mitral regurgitation and marked left atrial enlargement with normal LV function.  She is now being admitted in preparation for mitral valve surgery.  PAST MEDICAL HISTORY: 1. Paroxysmal atrial fibrillation status post initial cardioversion in     2009, repeat cardioversion in August 2011, and then AFib ablation     in November 2011 with repeat cardioversion in January 2012. 2. Hypertension. 3. Dyslipidemia. 4. Hiatal hernia. 5. Diverticulitis. 6. GERD. 7. Osteopenia. 8. Fracture. 9. DJD of the spine status post several epidural steroid injections. 10.2-D  echo in February 2011 with mild aortic stenosis and aortic     insufficiency.  PAST SURGICAL HISTORY: 1. Umbilical hernia repair x2. 2. Cholecystectomy. 3. Bilateral shoulder surgeries. 4. Hysterectomy for bleeding. 5. C5-C6 fusion by Dr. Ethelene Hal. 6. Left knee surgery x2. 7. RF catheter ablation of AFib in November 2011.  FAMILY HISTORY:  Father died at 24 with complications of MI.  Her mother died at 62 with complications of an MI.  She has a brother of 36 with hypertension and a sister of 53 with hypertension and Parkinson's and she has 3 children.  MEDICATIONS: 1. Calcium carbonate and vitamin D 1200/1000 mg/unit chewable tablet     daily. 2. Glucosamine liquid daily. 3. Protonix 40 mg daily. 4. Catapres-TTS-2 one patch weekly. 5. Coumadin as directed. 6. Klor-Con 20 mEq daily. 7. Carvedilol 12.5 mg b.i.d. 8. Amiodarone 200 mg daily. 9. Flonase 50 mcg 2 sprays as needed. 10.Diltiazem 180 mg daily. 11.Tylenol p.r.n.  ALLERGIES:  CODEINE which causes nausea and vomiting, DARVOCET causes upset stomach and there was a question of a history of iodine reaction in the past although the patient states she has had iodine since then and she does not recall ever having allergic reaction to iodine, but said a physician a long  time ago told her she did.  She does not have any shellfish allergy.  SOCIAL HISTORY:  She is a former smoker.  She denies any alcohol use. She occasionally drinks caffeine.  She is retired and works part-time at the [employer] at the [employer].  She is divorced.  REVIEW OF SYSTEMS:  Other than stated in HPI is negative.  PHYSICAL EXAMINATION:  VITAL SIGNS:  Blood pressure is 185/72 mmHg. GENERAL:  This is a well-developed, well-nourished female in no acute distress. HEENT:  Benign. NECK:  Supple without lymphadenopathy.  Carotid upstrokes 2+ bilaterally.  No bruits. LUNGS:  Clear to auscultation throughout. HEART:  Regular rate and  rhythm with a 2/6 systolic murmur at the left lower sternal border.  Normal S1-S2. ABDOMEN:  Soft, nontender, and nondistended without bowel sounds.  No hepatosplenomegaly. EXTREMITIES:  No edema.  ASSESSMENT: 1. Moderate-to-severe mitral regurgitation with markedly dilated left     atrium by both TEE and catheterization. 2. Normal left ventricular function. 3. Normal coronary arteries. 4. Hypertension. 5. Paroxysmal atrial fibrillation status post failed sotalol on     amiodarone as well as atrial fibrillation ablation.  PLAN:  We will admit to telemetry bed for further workup for mitral valve repair by Dr. Kathlee Nations Trigt later this week as well as a maze procedure.     Armanda Magic, M.D.     TT/MEDQ  D:  05/02/2010  T:  05/02/2010  Job:  [id number]  cc:   Sigmund Hazel, M.D.  Electronically Signed by Armanda Magic M.D. on 05/03/2010 07:47:35 PM

## 2010-05-03 NOTE — Cardiovascular Report (Signed)
NAME:  Ashley [name], KNOBLOCH NO.:  [id number]  MEDICAL RECORD NO.:  [id number]           PATIENT TYPE:  I  LOCATION:  2309                         FACILITY:  [facility]  PHYSICIAN:  Armanda Magic, M.D.     DATE OF BIRTH:  [birth year]  DATE OF PROCEDURE:  04/27/2010 DATE OF DISCHARGE:                           CARDIAC CATHETERIZATION   REFERRING PHYSICIAN:  Sigmund Hazel, MD  PROCEDURE:  Right and left heart catheterization, coronary angiography, left ventriculography.  OPERATOR:  Armanda Magic, MD  INDICATIONS:  Evaluation for surgery for mitral valve repair and maze procedure.  COMPLICATIONS:  None.  IV ACCESS:  Via right femoral artery 4-French sheath and right femoral vein 5-French sheath.  MEDICATIONS:  Versed 2 mg and fentanyl 50 mcg.  HISTORY OF PRESENT ILLNESS:  This is a 70 year old female with a history of paroxysmal atrial fibrillation, hypertension and mitral regurgitation who recently underwent transesophageal echo prior to undergoing ablation for atrial flutter and was found to have 3+ mitral regurgitation with a possible ruptured chordae tendineae.  The mitral regurgitation extended back to the left atrial posterior wall.  The left atrium was markedly enlarged at 6.3 cm.  She now presents for right and left heart catheterization prior to undergoing mitral valve repair.  The patient is brought to cardiac catheterization laboratory in a fasting nonsedated state.  Informed consent was obtained.  The patient was connected to continuous heart rate and pulse oximetry monitoring and blood pressure monitoring.  The right groin was prepped and draped in a sterile fashion.  Xylocaine 1% was used for local anesthesia.  Using a modified Seldinger technique, a 4-French sheath was placed in right femoral vein.  Using modified Seldinger technique, a 5-French femoral sheath was placed in the right femoral vein.  Under fluoroscopic guidance, a Swan-Ganz catheter was  placed under balloon flotation into the right atrium.  Right atrial pressure and O2 saturations were obtained.  The catheter was advanced in the right ventricle and right ventricular pressure was obtained.  The catheter was then advanced into the pulmonary artery and allowed to float into the pulmonary capillary wedge position.  Of note, V-waves were noted on the pulmonary capillary wedge tracing.  The balloon was then deflated and pulled back underneath the pulmonary artery and pulmonary artery pressure and O2 saturations were obtained.  Cardiac outputs were then performed using 10 mL injections of saline on three separate injections.  After that was complete, the catheter was removed under fluoroscopic guidance.  Under fluoroscopic guidance, a 4-French JL-4 catheter was placed in the left coronary artery.  Multiple cine films were taken at 30-degree RAO and 40-degree LAO views.  This catheter was then exchanged out over a guidewire for a 4-French 3-DRCA catheter which successfully engaged the right coronary ostium.  Multiple cine films were taken at 30-degree RAO and 40-degree LAO views.  This catheter was then exchanged out over a guidewire for a 4-French angled pigtail catheter which was advanced into the ascending aorta under fluoroscopic guidance, but could not cross the valve.  The femoral artery sheath was exchanged out for a 5-French sheath and then a  5-French angled pigtail catheter was placed under fluoroscopic guidance in left ventricular cavity.  Left ventriculography was performed in 30-degree RAO view using total of 30 mL of contrast at 12 mL per second.  The catheter was then pulled back across the aortic valve with no significant gradient noted.  At the end of the procedure, all catheters and sheaths were removed.  Manual compression was performed so adequate hemostasis was obtained.  The patient was transferred back to room in stable condition.  RESULTS:  The right  heart cath data included right atrial pressure 4/1 with a mean of 0 mmHg.  RV pressure 36/0 with a mean of 3 mmHg.  PA pressure 35/8 with a mean of 21 mmHg.  Pulmonary capillary wedge 12/18 with a mean 11 mmHg.  There were evidence of fairly large V-waves in the pulmonary capillary wedge tracing.  O2 saturations aorta 95%, right atrium 68%, pulmonary artery 66%, cardiac output by Fick 4.4, thermodilution 3.9, cardiac index by Fick 2.4 and thermodilution 2.1, LV pressure 189/13 mmHg, aortic pressure 185/72 mmHg, LVEDP 22 mmHg.  The left ventriculography showed normal LV function EF 60% with severe mitral regurgitation, markedly enlarged left atrium.  The left coronary artery is widely patent and bifurcates in left anterior descending artery and left circumflex artery.  Left anterior descending artery is widely patent throughout its course. The apex giving rise to a moderate-sized diagonal branch which is widely patent and distally bifurcates in 2 daughter branches both of which are widely patent.  The left circumflex immediately gives off a large obtuse marginal 1 branch which is widely patent and bifurcates into 2 daughter branches both of which are widely patent.  The ongoing left circumflex traverses the AV groove, is widely patent giving rise to a second obtuse marginal branch which is widely patent.  The right coronary artery is widely patent throughout its course and distally bifurcates into posterior descending artery and posterolateral artery both of which are widely patent.  ASSESSMENT: 1. Normal coronary arteries. 2. Normal right heart catheterization pressures with large V-waves     noted on pulmonary capillary wedge tracing. 3. Severe mitral regurgitation with markedly enlarged left atrium. 4. Normal left ventricular function.  PLAN:  We will admit to telemetry bed for mitral valve repair later this week for Dr. Kathlee Nations Trigt.     Armanda Magic,  M.D.     TT/MEDQ  D:  05/02/2010  T:  05/02/2010  Job:  [id number]  cc:   Kerin Perna, M.D. Sigmund Hazel, M.D.  Electronically Signed by Armanda Magic M.D. on 05/03/2010 07:47:30 PM

## 2010-05-04 ENCOUNTER — Inpatient Hospital Stay (HOSPITAL_COMMUNITY): Payer: Medicare Other

## 2010-05-04 LAB — POCT I-STAT, CHEM 8
BUN: 26 mg/dL — ABNORMAL HIGH (ref 6–23)
Calcium, Ion: 1.08 mmol/L — ABNORMAL LOW (ref 1.12–1.32)
Chloride: 96 mEq/L (ref 96–112)
Creatinine, Ser: 1.3 mg/dL — ABNORMAL HIGH (ref 0.4–1.2)
Glucose, Bld: 114 mg/dL — ABNORMAL HIGH (ref 70–99)
HCT: 28 % — ABNORMAL LOW (ref 36.0–46.0)
Hemoglobin: 9.5 g/dL — ABNORMAL LOW (ref 12.0–15.0)
Potassium: 3.3 mEq/L — ABNORMAL LOW (ref 3.5–5.1)
Sodium: 139 mEq/L (ref 135–145)
TCO2: 33 mmol/L (ref 0–100)

## 2010-05-04 LAB — GLUCOSE, CAPILLARY
Glucose-Capillary: 99 mg/dL (ref 70–99)
Glucose-Capillary: 111 mg/dL — ABNORMAL HIGH (ref 70–99)
Glucose-Capillary: 122 mg/dL — ABNORMAL HIGH (ref 70–99)

## 2010-05-04 LAB — COMPREHENSIVE METABOLIC PANEL
ALT: 76 U/L — ABNORMAL HIGH (ref 0–35)
AST: 94 U/L — ABNORMAL HIGH (ref 0–37)
Albumin: 2.5 g/dL — ABNORMAL LOW (ref 3.5–5.2)
Alkaline Phosphatase: 121 U/L — ABNORMAL HIGH (ref 39–117)
BUN: 20 mg/dL (ref 6–23)
CO2: 32 mEq/L (ref 19–32)
Calcium: 8 mg/dL — ABNORMAL LOW (ref 8.4–10.5)
Chloride: 101 mEq/L (ref 96–112)
Creatinine, Ser: 1 mg/dL (ref 0.4–1.2)
GFR calc Af Amer: >60 mL/min (ref >60)
GFR calc non Af Amer: 55 mL/min — ABNORMAL LOW (ref >60)
Glucose, Bld: 116 mg/dL — ABNORMAL HIGH (ref 70–99)
Potassium: 3.2 mEq/L — ABNORMAL LOW (ref 3.5–5.1)
Sodium: 139 mEq/L (ref 135–145)
Total Bilirubin: 0.8 mg/dL (ref 0.3–1.2)
Total Protein: 5.7 g/dL — ABNORMAL LOW (ref 6.0–8.3)

## 2010-05-04 LAB — TSH: TSH: 5.214 u[IU]/mL — ABNORMAL HIGH (ref 0.350–4.500)

## 2010-05-04 LAB — PROTIME-INR
INR: 1.5 — ABNORMAL HIGH (ref 0.00–1.49)
Prothrombin Time: 18.3 seconds — ABNORMAL HIGH (ref 11.6–15.2)

## 2010-05-04 LAB — CBC
HCT: 29.6 % — ABNORMAL LOW (ref 36.0–46.0)
Hemoglobin: 9.9 g/dL — ABNORMAL LOW (ref 12.0–15.0)
MCH: 29.5 pg (ref 26.0–34.0)
MCHC: 33.4 g/dL (ref 30.0–36.0)
MCV: 88.1 fL (ref 78.0–100.0)
Platelets: 121 10*3/uL — ABNORMAL LOW (ref 150–400)
RBC: 3.36 MIL/uL — ABNORMAL LOW (ref 3.87–5.11)
RDW: 16 % — ABNORMAL HIGH (ref 11.5–15.5)
WBC: 11.9 10*3/uL — ABNORMAL HIGH (ref 4.0–10.5)

## 2010-05-04 LAB — BLOOD GAS, ARTERIAL
Acid-Base Excess: 7.3 mmol/L — ABNORMAL HIGH (ref 0.0–2.0)
Bicarbonate: 31.2 mEq/L — ABNORMAL HIGH (ref 20.0–24.0)
FIO2: 50 %
O2 Saturation: 89.1 %
Patient temperature: 98.6
TCO2: 32.6 mmol/L (ref 0–100)
pCO2 arterial: 43.5 mmHg (ref 35.0–45.0)
pH, Arterial: 7.47 — ABNORMAL HIGH (ref 7.350–7.400)
pO2, Arterial: 55.3 mmHg — ABNORMAL LOW (ref 80.0–100.0)

## 2010-05-04 NOTE — Op Note (Signed)
  NAME:  Ashley [name], ORBACH NO.:  [id number]  MEDICAL RECORD NO.:  [id number]          PATIENT TYPE:  LOCATION:                                 FACILITY:  PHYSICIAN:  Kerin Perna, M.D.  DATE OF BIRTH:  09/29/1940  DATE OF PROCEDURE:  04/30/2010 DATE OF DISCHARGE:                              OPERATIVE REPORT   OPERATION:  Delayed sternal closure following AVR mitral valve repair, Maze procedure on April 29, 2010.  SURGEON:  Kerin Perna, MD  ANESTHESIA:  General by Dr. Noreene Larsson.  PROCEDURE:  The patient was brought directly back to the operating room from the ICU approximately 24 hours after having undergone a double valve repair/replacement and Maze procedure as well as reconstruction of friable, bleeding ascending aorta with a Hemashield graft.  The patient was stable overnight and diuresed significant volume and it was felt that sternal reclosure would be possible without hemodynamic compromise. A new right subclavian triple-lumen catheter was placed in the operating room prior to prepping the patient.  After the patient was prepped and draped as a sterile field, a proper time-out was performed.  The Esmarch dressing previously placed was removed.  The mediastinum was inspected and found to have minimal amount of blood or clot with viable tissue. The mediastinum was irrigated with vancomycin irrigation as well as a pulse lavage irrigator.  There was no necrotic tissues or debris.  New chest tubes were placed in each pleural space and into the anterior mediastinum and secured to the skin.  Sternal wires were placed.  The sternum was closed down and hemodynamics remained stable without change. The pectoralis fascia was closed in running #1 Vicryl, and subcutaneous and skin layers were closed in running Vicryl, and sterile dressings were applied.  Blood loss was minimal and the patient returned back to the intensive care unit in critical but stable  condition.     Kerin Perna, M.D.     PV/MEDQ  D:  04/30/2010  T:  05/01/2010  Job:  [id number]  Electronically Signed by Kerin Perna M.D. on 05/04/2010 12:44:36 PM

## 2010-05-04 NOTE — Op Note (Signed)
NAME:  Ashley [name], Ashley [name] NO.:  [id number]  MEDICAL RECORD NO.:  [id number]           PATIENT TYPE:  I  LOCATION:  2309                         FACILITY:  [facility]  PHYSICIAN:  Kerin Perna, M.D.  DATE OF BIRTH:  July 27, 1940  DATE OF PROCEDURE:  04/29/2010 DATE OF DISCHARGE:                              OPERATIVE REPORT   OPERATION: 1. Mitral valve repair with a 26-mm Edwards annuloplasty ring and     posterior commissure plasty. 2. Left-sided maze procedure. 3. Ligation of left atrial appendage. 4. Aortic valve replacement for significant aortic insufficiency with     a 19-mm supra-annular pericardial tissue valve (serial number [id number]). 5. Hemashield reconstruction of a tear in the ascending aorta at a     cannulation site. 6. Placement of left femoral A-line  PREOPERATIVE DIAGNOSIS:  Moderate mitral regurgitation, recurrent paroxysmal atrial fibrillation, moderate aortic insufficiency.  POSTOPERATIVE DIAGNOSIS:  Moderate mitral regurgitation, recurrent paroxysmal atrial fibrillation, moderate aortic insufficiency.  SURGEON:  Kerin Perna, MD  ASSISTANT:  Gershon Crane PA-C and Doree Fudge, PA-C  ANESTHESIA:  General by Dr. Michelle Piper.  INDICATIONS:  The patient is a 70 year old female followed carefully by Dr. Mayford Knife who has had progressive dyspnea on exertion and increasing mitral regurgitation by serial 2-D echocardiogram.  Late last year, she underwent an ablation procedure by Dr. Johney Frame for paroxysmal atrial fibrillation.  Over the past few months, she has had progressive dyspnea on exertion walking from her car to her job at the [employer].  She has had some intermittent recurrent atrial fibrillation as well and her echocardiogram shows increase in severity of mitral regurgitation with left atrial dilatation.  Coronary catheterization showed no significant coronary disease and it was felt she would benefit from mitral valve repair with  combined maze procedure.  She did have mild aortic insufficiency on her echo.  Prior to surgery, I discussed the results of her cardiac studies as well as the recommendation for mitral valve repair and a combined maze procedure.  We also discussed the possibility of an aortic valve replacement if intraoperatively aortic valve had a significant regurgitation.  I discussed the major details of surgery including use general anesthesia and cardiopulmonary bypass, location of surgical incisions, and the expected postoperative hospital recovery.  I discussed with her the plan to do the mitral valve repair over replacement, but if she needed replacement, then a bioprosthetic valve would be recommended at age 34.  I discussed with Ms. Fulbright the risks of this operation, the risks of MI, stroke, bleeding, blood transfusion requirement, infection, and death.  After reviewing these issues, she demonstrated her understanding and agreed to proceed with surgery under what I felt was an informed consent.  OPERATIVE FINDINGS: 1. Moderate mitral regurgitation 3+ from mainly central jet from     annular dilatation, but also from some restriction of the posterior     leaflet in the P3 segment corrected with mitral valve repair. 2. Significant intraoperative aortic insufficiency noted on echo as     well as by inability to effectively deliver cardioplegia after     crossclamping the aorta  due to an insufficient aortic valve which     was replaced. 3. Difficult exposure of the mitral valve and a very friable ascending     aorta which required multiple pledget reinforced sutures to close     the aortotomy for the aortic valve and which required a Hemashield     reconstruction for a tear at the site of the cardioplegia cannula. 4. Cardiac edema at the end of the procedure which precluded     successful closure of the chest with sternal wires, but was closed     instead with an Esmarch wound VAC dressing and  adequate coverage of     the mediastinum.  PROCEDURE:  The patient was brought to operative placed, place supine on the operating table where general anesthesia was induced.  The chest, abdomen, and legs were prepped with Betadine and draped as a sterile field.  A transesophageal 2-D echo probe was placed by the anesthesiologist, which confirmed moderate mitral regurgitation of a central jet as well as 2+ aortic insufficiency.  A sternal incision was made and the pericardium was opened and suspended.  Heparin was administered.  Pursestring was placed in the ascending aorta and in the superior vena cava for bicaval drainage. After the heparin was administered and the ACT was documented as being therapeutic, the patient was cannulated in the ascending aorta and the superior vena cava with a single venous cannula.  She was placed on bypass and a second pursestring was placed low on the right atrium above the inferior vena cava and a second right-angle cannula was placed in the IVC and caval tapes were placed around the SVC and IVC cannula.  The interatrial groove was dissected out and cardioplegia catheters were placed both antegrade and retrograde cold blood cardioplegia.  A left ventricular vent was placed via the right superior pulmonary vein and the patient was cooled to 32 degrees.  Aortic crossclamp was applied. An initial attempt to deliver antegrade cardioplegia through the aortic root was unsuccessful due to an insufficient aortic valve.  For this reason, retrograde cardioplegia was used for most of the case with exception of some direct cannulation of the coronary ostia after the aorta was opened for the aortic valve replacement.  Approximately a liter of cold blood cardioplegia was given and septal temperature dropped less than 15 degrees.  The left-sided maze procedure was then performed.  The heart was retracted to the patient's right and the left-sided pulmonary veins  were dissected and encircled with a vessel loop.  Around the cuff of the left- sided veins, the bipolar radiofrequency Medtronic clamp was applied and two ablation lines encompassing the left-sided veins were delivered. Next, the heart was retracted to the patient's left and the right pulmonary artery was dissected from the right superior pulmonary vein and a vessel loop was placed around the right-sided pulmonary vein left atrial cuff.  Around this space, the bipolar radiofrequency ablation clamp was then placed to deliver 2 ablation lines at the junction of the right-sided pulmonary veins and the left atrium.  Next, a left atriotomy was performed as the caval tapes were applied. The bipolar clamp was placed across the floor of the left atrium, joining the two encircling pulmonary vein ablation lines.  The second ablation line was directed from the atriotomy up to the P3 segment of the mitral valve annulus.  A third ablation line across the left atrium was applied to the bipolar clamp in between the two previously placed lines.  Next, the left atrial appendage was oversewn obliterated with two running 4-0 Prolene suture lines.  Next, the mitral valve retractors were positioned to expose the mitral valve which was difficult due to the large size of left atrium.  The mitral valve saline test showed a central regurgitation as well as some regurgitation along the P3 segment from probable restricted leaflet. Annuloplasty sutures of 2-0 Ethibond were placed around the mitral annulus for exposure.  A saline test after placement of these sutures showed good coaptation of the central area of the two leaflets, but there was prolapse in the area of P3.  This was closed with a figure-of- eight 5-0 Ethibond commissure plasty, which resulted in good coaptation and no residual regurgitation.  The valve was sized to 26-mm physio II ring (serial number [id number]) and the ring was seated and the  sutures were tied and saline test was repeated and was very satisfactory without mitral regurgitation.  At this point, the atriotomy was closed with one layer 4-0 Prolene and the LV vent was placed again through the mitral valve and left ventricle through the right superior pulmonary vein over a tourniquet and keeper.  Cardioplegia was redosed.  The aortic valve was then exposed through a transverse aortotomy.  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  It was felt the replacement would be the best option.  The valve leaflets were excised.  The annulus was trimmed.  It was irrigated with saline and sized to a 19-mm mitral flow bioprosthetic pericardial valve.  A 2-0 Ethibond subannular sutures were placed around the annulus numbering 14 total.  After the valve was prepared according to protocol, the sutures were placed in the sewing ring valve was seated and sutures were tied.  The valve conformed annulus well and there were no spaces for perivalvular leak and the coronary ostium were widely patent.  The aortotomy was closed with interrupted 4-0 Prolene sutures as the aortic tissue was extremely friable and a running suture line with left small tears with each pass of the needle.  After the interrupted 4-0 Prolene were placed, a running four Prolene was placed through the pledgeted material to reinforce the suture line.  The patient was then rewarmed and reperfused and the crossclamp was removed.  Cardioplegia cannulas were removed.  The keepers were left intact on the ascending where the aortic antegrade cardioplegia catheter was placed.  Temporary pacing wires were applied and the ventilator was resumed.  The patient was rewarmed, reperfused, and then weaned off bypass on low dose of dopamine and milrinone.  The echo with good with good LV function.  No significant residual MR and aortic prosthesis was functioning normally.  Protamine was administered up.  At this point, the  aortic cannula was left in place, but the venous cannula and then removed and the aortic vent-cardioplegic cannula was removed and the pledgeted sutures which were held to keeper were then tied.  This however resulted in tear of the fragile aortic tissue, and despite multiple attempts at primary repair with pledgeted sutures, there was no success and controlling the bleeding from the very fragile anterior surface of the ascending aorta.  The patient was then re-given heparin and the venous cannula was replaced and the patient was placed on cardiopulmonary bypass as pressure was held on the ascending aorta. A partial occluding clamp was finally able to stop the significant bleeding.  However, this clamping right up to the aortic cannula and was felt that in order to repair the  ascending aorta, the aorta would have to be crossclamped and there was no room for clamp.  An incision was then made in the right groin to expose the right femoral artery which was exposed and two 3-0 Gore-Tex pursestrings were placed on the anterior surface of femoral artery which was then cannulated over guidewire with the 18-French as ESTECH arterial cannula.  This was secured to the skin and the patient was quickly transferred from inflow from the ascending aorta to the femoral artery.  The cannula and the ascending aorta was then removed and the keeper pledgeted sutures were tightened.  A new of retrograde cardioplegia catheter was placed in the coronary sinus and the patient was cooled.  The aortic crossclamp was then applied above the area of the tear in the ascending aorta and 800 mL of cold blood cardioplegia was delivered retrograde with good cardioplegic arrest.  The LV vent was on.  After cardioplegic arrest had been achieved, the ascending aorta was examined and there were too large tears.  These were combined into one defect and the edges of the aorta sharply trimmed.  This left a 2.5- to 3-cm defect,  which I decided to close with a Hemashield patch using interrupted 4-0 pledgeted Prolene sutures around the circumference of the defect with pledgets on each side of the sutures.  After the sutures had been tied on the Hemashield patch, air was vented from the coronaries with a dose of retrograde warm blood cardioplegia and the crossclamp was removed.  There was minimal bleeding from the repair site of the ascending aorta after removal of crossclamp fortunately, and the patient was rewarmed and reperfused.  We took the retrograde catheter out and started low-dose milrinone and dopamine again.  The patient had significant coagulopathy and platelet count returned at 50,000.  The lungs were re-expanded, ventilator resumed, and the patient was then weaned off bypass on low-dose drips. The echo showed good global LV function and the mitral valve repair was intact without significant MR and the aortic prosthesis working well. Protamine was administered and then platelets were given as well as FFP. There was still considerable coagulopathy.  The patient was given recombinant factor VII with improved coagulation function.  She remained hemodynamically stable and the femoral cannula was removed and the femoral artery pursestring was tied.  Considerable time was spent for hemostasis.  Fortunately, the aortic sutures and the aortotomy and the Hemashield patch were hemostatic.  The superior pericardial fat was closed over the aorta and bilateral pleural tubes and 2 mediastinal tubes were placed.  I placed sternal wires and attempted to close the chest; however, due to edema of the heart and the enlarged right ventricle, this resulted in low blood pressure, so the sternal wires were removed and the chest was closed with an Esmarch sterile dressing covered with a wound VAC.  The groin incision was closed with Vicryl and a subcuticular.  The patient returned to the [facility] in critical, but  stable condition.  There were 2 cardiopulmonary bypass runs for a total of 325 minutes.     Kerin Perna, M.D.     PV/MEDQ  D:  04/29/2010  T:  04/30/2010  Job:  [id number]  cc:   Armanda Magic, M.D.  Electronically Signed by Kerin Perna M.D. on 05/04/2010 12:44:26 PM

## 2010-05-05 ENCOUNTER — Inpatient Hospital Stay (HOSPITAL_COMMUNITY): Payer: Medicare Other

## 2010-05-05 DIAGNOSIS — I4891 Unspecified atrial fibrillation: Secondary | ICD-10-CM

## 2010-05-05 LAB — BASIC METABOLIC PANEL
BUN: 24 mg/dL — ABNORMAL HIGH (ref 6–23)
CO2: 34 mEq/L — ABNORMAL HIGH (ref 19–32)
Calcium: 8.1 mg/dL — ABNORMAL LOW (ref 8.4–10.5)
Chloride: 100 mEq/L (ref 96–112)
Creatinine, Ser: 1.04 mg/dL (ref 0.4–1.2)
GFR calc Af Amer: >60 mL/min (ref >60)
GFR calc non Af Amer: 52 mL/min — ABNORMAL LOW (ref >60)
Glucose, Bld: 108 mg/dL — ABNORMAL HIGH (ref 70–99)
Potassium: 3.5 mEq/L (ref 3.5–5.1)
Sodium: 141 mEq/L (ref 135–145)

## 2010-05-05 LAB — CBC
HCT: 27.4 % — ABNORMAL LOW (ref 36.0–46.0)
Hemoglobin: 9 g/dL — ABNORMAL LOW (ref 12.0–15.0)
MCH: 29.3 pg (ref 26.0–34.0)
MCHC: 32.8 g/dL (ref 30.0–36.0)
MCV: 89.3 fL (ref 78.0–100.0)
Platelets: 149 10*3/uL — ABNORMAL LOW (ref 150–400)
RBC: 3.07 MIL/uL — ABNORMAL LOW (ref 3.87–5.11)
RDW: 15.6 % — ABNORMAL HIGH (ref 11.5–15.5)
WBC: 12.5 10*3/uL — ABNORMAL HIGH (ref 4.0–10.5)

## 2010-05-05 LAB — POCT I-STAT 4, (NA,K, GLUC, HGB,HCT)
Glucose, Bld: 115 mg/dL — ABNORMAL HIGH (ref 70–99)
HCT: 28 % — ABNORMAL LOW (ref 36.0–46.0)
Hemoglobin: 9.5 g/dL — ABNORMAL LOW (ref 12.0–15.0)
Potassium: 3.8 mEq/L (ref 3.5–5.1)
Sodium: 142 mEq/L (ref 135–145)

## 2010-05-05 LAB — PROTIME-INR
INR: 1.87 — ABNORMAL HIGH (ref 0.00–1.49)
Prothrombin Time: 21.7 seconds — ABNORMAL HIGH (ref 11.6–15.2)

## 2010-05-06 ENCOUNTER — Inpatient Hospital Stay (HOSPITAL_COMMUNITY): Payer: Medicare Other

## 2010-05-06 LAB — CBC
HCT: 27.1 % — ABNORMAL LOW (ref 36.0–46.0)
Hemoglobin: 8.8 g/dL — ABNORMAL LOW (ref 12.0–15.0)
MCH: 29.7 pg (ref 26.0–34.0)
MCHC: 32.5 g/dL (ref 30.0–36.0)
MCV: 91.6 fL (ref 78.0–100.0)
Platelets: 192 10*3/uL (ref 150–400)
RBC: 2.96 MIL/uL — ABNORMAL LOW (ref 3.87–5.11)
RDW: 15.9 % — ABNORMAL HIGH (ref 11.5–15.5)
WBC: 10.5 10*3/uL (ref 4.0–10.5)

## 2010-05-06 LAB — COMPREHENSIVE METABOLIC PANEL
ALT: 75 U/L — ABNORMAL HIGH (ref 0–35)
AST: 58 U/L — ABNORMAL HIGH (ref 0–37)
Albumin: 2.4 g/dL — ABNORMAL LOW (ref 3.5–5.2)
Alkaline Phosphatase: 98 U/L (ref 39–117)
BUN: 21 mg/dL (ref 6–23)
CO2: 33 mEq/L — ABNORMAL HIGH (ref 19–32)
Calcium: 8.2 mg/dL — ABNORMAL LOW (ref 8.4–10.5)
Chloride: 103 mEq/L (ref 96–112)
Creatinine, Ser: 0.91 mg/dL (ref 0.4–1.2)
GFR calc Af Amer: >60 mL/min (ref >60)
GFR calc non Af Amer: >60 mL/min (ref >60)
Glucose, Bld: 106 mg/dL — ABNORMAL HIGH (ref 70–99)
Potassium: 3.8 mEq/L (ref 3.5–5.1)
Sodium: 141 mEq/L (ref 135–145)
Total Bilirubin: 0.7 mg/dL (ref 0.3–1.2)
Total Protein: 5.5 g/dL — ABNORMAL LOW (ref 6.0–8.3)

## 2010-05-06 LAB — CULTURE, RESPIRATORY W GRAM STAIN

## 2010-05-06 LAB — PROTIME-INR
INR: 1.64 — ABNORMAL HIGH (ref 0.00–1.49)
Prothrombin Time: 19.6 seconds — ABNORMAL HIGH (ref 11.6–15.2)

## 2010-05-07 ENCOUNTER — Inpatient Hospital Stay (HOSPITAL_COMMUNITY): Payer: Medicare Other

## 2010-05-07 LAB — BASIC METABOLIC PANEL
BUN: 15 mg/dL (ref 6–23)
CO2: 39 mEq/L — ABNORMAL HIGH (ref 19–32)
Calcium: 8.4 mg/dL (ref 8.4–10.5)
Chloride: 92 mEq/L — ABNORMAL LOW (ref 96–112)
Creatinine, Ser: 0.85 mg/dL (ref 0.4–1.2)
GFR calc Af Amer: >60 mL/min (ref >60)
GFR calc non Af Amer: >60 mL/min (ref >60)
Glucose, Bld: 97 mg/dL (ref 70–99)
Potassium: 2.8 mEq/L — ABNORMAL LOW (ref 3.5–5.1)
Sodium: 139 mEq/L (ref 135–145)

## 2010-05-07 LAB — CBC
HCT: 26.3 % — ABNORMAL LOW (ref 36.0–46.0)
Hemoglobin: 8.4 g/dL — ABNORMAL LOW (ref 12.0–15.0)
MCH: 29.2 pg (ref 26.0–34.0)
MCHC: 31.9 g/dL (ref 30.0–36.0)
MCV: 91.3 fL (ref 78.0–100.0)
Platelets: 243 10*3/uL (ref 150–400)
RBC: 2.88 MIL/uL — ABNORMAL LOW (ref 3.87–5.11)
RDW: 15.8 % — ABNORMAL HIGH (ref 11.5–15.5)
WBC: 11.2 10*3/uL — ABNORMAL HIGH (ref 4.0–10.5)

## 2010-05-07 LAB — PROTIME-INR
INR: 1.55 — ABNORMAL HIGH (ref 0.00–1.49)
Prothrombin Time: 18.8 seconds — ABNORMAL HIGH (ref 11.6–15.2)

## 2010-05-08 ENCOUNTER — Inpatient Hospital Stay (HOSPITAL_COMMUNITY): Payer: Medicare Other

## 2010-05-08 LAB — BASIC METABOLIC PANEL
BUN: 18 mg/dL (ref 6–23)
CO2: 37 mEq/L — ABNORMAL HIGH (ref 19–32)
Calcium: 8.4 mg/dL (ref 8.4–10.5)
Chloride: 94 mEq/L — ABNORMAL LOW (ref 96–112)
Creatinine, Ser: 0.87 mg/dL (ref 0.4–1.2)
GFR calc Af Amer: >60 mL/min (ref >60)
GFR calc non Af Amer: >60 mL/min (ref >60)
Glucose, Bld: 103 mg/dL — ABNORMAL HIGH (ref 70–99)
Potassium: 3 mEq/L — ABNORMAL LOW (ref 3.5–5.1)
Sodium: 140 mEq/L (ref 135–145)

## 2010-05-08 LAB — CBC
HCT: 25 % — ABNORMAL LOW (ref 36.0–46.0)
Hemoglobin: 8.2 g/dL — ABNORMAL LOW (ref 12.0–15.0)
MCH: 29.8 pg (ref 26.0–34.0)
MCHC: 32.8 g/dL (ref 30.0–36.0)
MCV: 90.9 fL (ref 78.0–100.0)
Platelets: 260 10*3/uL (ref 150–400)
RBC: 2.75 MIL/uL — ABNORMAL LOW (ref 3.87–5.11)
RDW: 15.6 % — ABNORMAL HIGH (ref 11.5–15.5)
WBC: 9 10*3/uL (ref 4.0–10.5)

## 2010-05-08 LAB — PROTIME-INR
INR: 1.36 (ref 0.00–1.49)
Prothrombin Time: 17 seconds — ABNORMAL HIGH (ref 11.6–15.2)

## 2010-05-09 LAB — BASIC METABOLIC PANEL
BUN: 15 mg/dL (ref 6–23)
CO2: 38 mEq/L — ABNORMAL HIGH (ref 19–32)
Calcium: 8.4 mg/dL (ref 8.4–10.5)
Chloride: 96 mEq/L (ref 96–112)
Creatinine, Ser: 0.84 mg/dL (ref 0.4–1.2)
GFR calc Af Amer: >60 mL/min (ref >60)
GFR calc non Af Amer: >60 mL/min (ref >60)
Glucose, Bld: 110 mg/dL — ABNORMAL HIGH (ref 70–99)
Potassium: 3.3 mEq/L — ABNORMAL LOW (ref 3.5–5.1)
Sodium: 140 mEq/L (ref 135–145)

## 2010-05-09 LAB — CBC
HCT: 25.5 % — ABNORMAL LOW (ref 36.0–46.0)
Hemoglobin: 8.2 g/dL — ABNORMAL LOW (ref 12.0–15.0)
MCH: 29.4 pg (ref 26.0–34.0)
MCHC: 32.2 g/dL (ref 30.0–36.0)
MCV: 91.4 fL (ref 78.0–100.0)
Platelets: 282 10*3/uL (ref 150–400)
RBC: 2.79 MIL/uL — ABNORMAL LOW (ref 3.87–5.11)
RDW: 15.6 % — ABNORMAL HIGH (ref 11.5–15.5)
WBC: 9.4 10*3/uL (ref 4.0–10.5)

## 2010-05-09 LAB — PROTIME-INR
INR: 1.38 (ref 0.00–1.49)
Prothrombin Time: 17.2 seconds — ABNORMAL HIGH (ref 11.6–15.2)

## 2010-05-10 ENCOUNTER — Inpatient Hospital Stay (HOSPITAL_COMMUNITY): Payer: Medicare Other

## 2010-05-10 LAB — CBC
HCT: 24.8 % — ABNORMAL LOW (ref 36.0–46.0)
Hemoglobin: 8.2 g/dL — ABNORMAL LOW (ref 12.0–15.0)
MCH: 30.5 pg (ref 26.0–34.0)
MCHC: 33.1 g/dL (ref 30.0–36.0)
MCV: 92.2 fL (ref 78.0–100.0)
Platelets: 305 10*3/uL (ref 150–400)
RBC: 2.69 MIL/uL — ABNORMAL LOW (ref 3.87–5.11)
RDW: 15.9 % — ABNORMAL HIGH (ref 11.5–15.5)
WBC: 9.1 10*3/uL (ref 4.0–10.5)

## 2010-05-10 LAB — BASIC METABOLIC PANEL
BUN: 16 mg/dL (ref 6–23)
CO2: 34 mEq/L — ABNORMAL HIGH (ref 19–32)
Calcium: 8.2 mg/dL — ABNORMAL LOW (ref 8.4–10.5)
Chloride: 97 mEq/L (ref 96–112)
Creatinine, Ser: 0.89 mg/dL (ref 0.4–1.2)
GFR calc Af Amer: >60 mL/min (ref >60)
GFR calc non Af Amer: >60 mL/min (ref >60)
Glucose, Bld: 103 mg/dL — ABNORMAL HIGH (ref 70–99)
Potassium: 3.5 mEq/L (ref 3.5–5.1)
Sodium: 139 mEq/L (ref 135–145)

## 2010-05-10 LAB — PROTIME-INR
INR: 1.39 (ref 0.00–1.49)
Prothrombin Time: 17.3 seconds — ABNORMAL HIGH (ref 11.6–15.2)

## 2010-05-11 ENCOUNTER — Inpatient Hospital Stay (HOSPITAL_COMMUNITY): Payer: Medicare Other

## 2010-05-11 DIAGNOSIS — R5381 Other malaise: Secondary | ICD-10-CM

## 2010-05-11 DIAGNOSIS — R1310 Dysphagia, unspecified: Secondary | ICD-10-CM

## 2010-05-11 LAB — PROTIME-INR
INR: 1.53 — ABNORMAL HIGH (ref 0.00–1.49)
Prothrombin Time: 18.6 seconds — ABNORMAL HIGH (ref 11.6–15.2)

## 2010-05-12 LAB — PROTIME-INR
INR: 1.56 — ABNORMAL HIGH (ref 0.00–1.49)
Prothrombin Time: 18.9 seconds — ABNORMAL HIGH (ref 11.6–15.2)

## 2010-05-13 ENCOUNTER — Inpatient Hospital Stay (HOSPITAL_COMMUNITY): Payer: Medicare Other

## 2010-05-13 LAB — BASIC METABOLIC PANEL
BUN: 19 mg/dL (ref 6–23)
CO2: 36 mEq/L — ABNORMAL HIGH (ref 19–32)
Calcium: 8.7 mg/dL (ref 8.4–10.5)
Chloride: 99 mEq/L (ref 96–112)
Creatinine, Ser: 0.84 mg/dL (ref 0.4–1.2)
GFR calc Af Amer: >60 mL/min (ref >60)
GFR calc non Af Amer: >60 mL/min (ref >60)
Glucose, Bld: 93 mg/dL (ref 70–99)
Potassium: 3.9 mEq/L (ref 3.5–5.1)
Sodium: 141 mEq/L (ref 135–145)

## 2010-05-13 LAB — CBC
HCT: 24.7 % — ABNORMAL LOW (ref 36.0–46.0)
Hemoglobin: 7.8 g/dL — ABNORMAL LOW (ref 12.0–15.0)
MCH: 29.4 pg (ref 26.0–34.0)
MCHC: 31.6 g/dL (ref 30.0–36.0)
MCV: 93.2 fL (ref 78.0–100.0)
Platelets: 339 10*3/uL (ref 150–400)
RBC: 2.65 MIL/uL — ABNORMAL LOW (ref 3.87–5.11)
RDW: 15.6 % — ABNORMAL HIGH (ref 11.5–15.5)
WBC: 4.8 10*3/uL (ref 4.0–10.5)

## 2010-05-13 LAB — PROTIME-INR
INR: 1.95 — ABNORMAL HIGH (ref 0.00–1.49)
Prothrombin Time: 22.4 seconds — ABNORMAL HIGH (ref 11.6–15.2)

## 2010-05-14 LAB — PROTIME-INR
INR: 2.44 — ABNORMAL HIGH (ref 0.00–1.49)
Prothrombin Time: 26.6 seconds — ABNORMAL HIGH (ref 11.6–15.2)

## 2010-05-14 LAB — HEPATIC FUNCTION PANEL
ALT: 48 U/L — ABNORMAL HIGH (ref 0–35)
AST: 32 U/L (ref 0–37)
Albumin: 2.8 g/dL — ABNORMAL LOW (ref 3.5–5.2)
Alkaline Phosphatase: 123 U/L — ABNORMAL HIGH (ref 39–117)
Bilirubin, Direct: <0.1 mg/dL (ref 0.0–0.3)
Total Bilirubin: 0.3 mg/dL (ref 0.3–1.2)
Total Protein: 6.4 g/dL (ref 6.0–8.3)

## 2010-05-14 LAB — AMYLASE: Amylase: 47 U/L (ref 0–105)

## 2010-05-15 LAB — CBC
HCT: 26 % — ABNORMAL LOW (ref 36.0–46.0)
Hemoglobin: 8.3 g/dL — ABNORMAL LOW (ref 12.0–15.0)
MCH: 29.4 pg (ref 26.0–34.0)
MCHC: 31.9 g/dL (ref 30.0–36.0)
MCV: 92.2 fL (ref 78.0–100.0)
Platelets: 337 10*3/uL (ref 150–400)
RBC: 2.82 MIL/uL — ABNORMAL LOW (ref 3.87–5.11)
RDW: 15.1 % (ref 11.5–15.5)
WBC: 5.9 10*3/uL (ref 4.0–10.5)

## 2010-05-15 LAB — PROTIME-INR
INR: 2.37 — ABNORMAL HIGH (ref 0.00–1.49)
Prothrombin Time: 26 seconds — ABNORMAL HIGH (ref 11.6–15.2)

## 2010-05-16 LAB — PROTIME-INR
INR: 2.45 — ABNORMAL HIGH (ref 0.00–1.49)
Prothrombin Time: 26.7 seconds — ABNORMAL HIGH (ref 11.6–15.2)

## 2010-05-16 LAB — PRO B NATRIURETIC PEPTIDE: Pro B Natriuretic peptide (BNP): 3928 pg/mL — ABNORMAL HIGH (ref 0–125)

## 2010-05-17 ENCOUNTER — Inpatient Hospital Stay (HOSPITAL_COMMUNITY): Payer: Medicare Other

## 2010-05-17 LAB — BASIC METABOLIC PANEL
BUN: 13 mg/dL (ref 6–23)
CO2: 39 mEq/L — ABNORMAL HIGH (ref 19–32)
Calcium: 9.4 mg/dL (ref 8.4–10.5)
Chloride: 96 mEq/L (ref 96–112)
Creatinine, Ser: 0.9 mg/dL (ref 0.4–1.2)
GFR calc Af Amer: >60 mL/min (ref >60)
GFR calc non Af Amer: >60 mL/min (ref >60)
Glucose, Bld: 91 mg/dL (ref 70–99)
Potassium: 2.9 mEq/L — ABNORMAL LOW (ref 3.5–5.1)
Sodium: 139 mEq/L (ref 135–145)

## 2010-05-17 LAB — PREPARE PLATELET PHERESIS
Unit division: 0
Unit division: 0
Unit division: 0

## 2010-05-17 LAB — PROTIME-INR
INR: 2.25 — ABNORMAL HIGH (ref 0.00–1.49)
Prothrombin Time: 25 seconds — ABNORMAL HIGH (ref 11.6–15.2)

## 2010-05-18 LAB — BASIC METABOLIC PANEL
BUN: 14 mg/dL (ref 6–23)
CO2: 36 mEq/L — ABNORMAL HIGH (ref 19–32)
Calcium: 9.3 mg/dL (ref 8.4–10.5)
Chloride: 93 mEq/L — ABNORMAL LOW (ref 96–112)
Creatinine, Ser: 0.99 mg/dL (ref 0.4–1.2)
GFR calc Af Amer: >60 mL/min (ref >60)
GFR calc non Af Amer: 55 mL/min — ABNORMAL LOW (ref >60)
Glucose, Bld: 99 mg/dL (ref 70–99)
Potassium: 2.9 mEq/L — ABNORMAL LOW (ref 3.5–5.1)
Sodium: 138 mEq/L (ref 135–145)

## 2010-05-18 LAB — PROTIME-INR
INR: 2.12 — ABNORMAL HIGH (ref 0.00–1.49)
Prothrombin Time: 23.9 seconds — ABNORMAL HIGH (ref 11.6–15.2)

## 2010-05-18 LAB — PRO B NATRIURETIC PEPTIDE: Pro B Natriuretic peptide (BNP): 254.9 pg/mL — ABNORMAL HIGH (ref 0–125)

## 2010-05-19 HISTORY — PX: OTHER SURGICAL HISTORY: SHX169

## 2010-05-19 LAB — PROTIME-INR
INR: 2 — ABNORMAL HIGH (ref 0.00–1.49)
Prothrombin Time: 22.8 seconds — ABNORMAL HIGH (ref 11.6–15.2)

## 2010-05-19 LAB — BASIC METABOLIC PANEL
BUN: 14 mg/dL (ref 6–23)
CO2: 34 mEq/L — ABNORMAL HIGH (ref 19–32)
Calcium: 9.2 mg/dL (ref 8.4–10.5)
Chloride: 98 mEq/L (ref 96–112)
Creatinine, Ser: 1.06 mg/dL (ref 0.4–1.2)
GFR calc Af Amer: >60 mL/min (ref >60)
GFR calc non Af Amer: 51 mL/min — ABNORMAL LOW (ref >60)
Glucose, Bld: 95 mg/dL (ref 70–99)
Potassium: 3.4 mEq/L — ABNORMAL LOW (ref 3.5–5.1)
Sodium: 138 mEq/L (ref 135–145)

## 2010-05-20 LAB — PROTIME-INR
INR: 2 — ABNORMAL HIGH (ref 0.00–1.49)
Prothrombin Time: 22.8 seconds — ABNORMAL HIGH (ref 11.6–15.2)

## 2010-05-20 NOTE — Op Note (Signed)
  NAMELILLE, Ashley [name] NO.:  [id number]  MEDICAL RECORD NO.:  [id number]           PATIENT TYPE:  I  LOCATION:  2024                         FACILITY:  [facility]  PHYSICIAN:  Jake Bathe, MD      DATE OF BIRTH:  1940/10/28  DATE OF PROCEDURE:  05/19/2010 DATE OF DISCHARGE:                              OPERATIVE REPORT   INDICATIONS:  Atrial fibrillation postoperatively, on amiodarone.  PROCEDURE DETAILS:  Informed consent was obtained.  Risk of stroke, heart attack, and arrhythmia were explained to the patient at length.  A single biphasic 150-joule shock was administered and successfully cardioverted from atrial fibrillation to sinus rhythm.  This was with the assistance of anesthesia who had administered propofol 75 mg.  She tolerated the procedure well without any difficulties.  Results were relayed to Gershon Crane of Cardiothoracic Surgery.     Jake Bathe, MD     MCS/MEDQ  D:  05/19/2010  T:  05/20/2010  Job:  [id number]  Electronically Signed by Donato Schultz MD on 05/20/2010 06:28:55 AM

## 2010-05-24 NOTE — Discharge Summary (Signed)
NAME:  Ashley [name], DIGILIO NO.:  [id number]  MEDICAL RECORD NO.:  [id number]           PATIENT TYPE:  I  LOCATION:  2024                         FACILITY:  [facility]  PHYSICIAN:  Kerin Perna, M.D.  DATE OF BIRTH:  Apr 02, 1940  DATE OF ADMISSION:  04/27/2010 DATE OF DISCHARGE:  05/13/2010                              DISCHARGE SUMMARY   HISTORY:  The patient is a 70 year old female with a history of atrial fibrillation and flutter status post atrial fibrillation ablation in November 2011, with previous cardioversion in January 2012.  She has been in atrial flutter and has failed antiarrhythmic therapy including sotalol and amiodarone and was referred to Dr. Johney Frame for plans to proceed with repeat ablation.  She underwent a TEE prior to A-flutter ablation and was found to have a markedly dilated atrium of 6.3 cm with a moderate to severe mitral regurgitation 3+ extending back into the posterior wall of the left atrium.  There is also a question of possible ruptured chordae tendineae.Aortic insufficiency 2+ was also noted on Echo. She presented for cardiac catheterization prior to proceeding with surgery.  Cardiac catheterization revealed normal coronary arteries with normal right heart pressures with increased V-waves and pulmonary capillary wedge tracing as well as severe mitral regurgitation and a marked left atrial enlargement with normal left ventricular function.  PAST MEDICAL HISTORY: 1. Paroxysmal atrial fibrillation status post initial cardioversion in     2009, repeat in 2011, and atrial fibrillation ablation in November     2011 with repeat cardioversion in January 2012. 2. Hypertension. 3. Dyslipidemia. 4. Hiatal hernia. 5. Diverticulitis. 6. Gastroesophageal reflux disease. 7. Osteopenia. 8. Degenerative joint disease of the spine status post several     epidural steroid injections. 9. Two-D echo in February 2011 with mild aortic stenosis and aortic     insufficiency.  PAST SURGICAL HISTORY: 1. Umbilical hernia repair x2. 2. Cholecystomy. 3. Bilateral shoulder surgeries. 4. Hysterectomy. 5. C5-C6 fusion. 6. Left knee surgery x2. 7. RF catheter ablation of atrial fibrillation in November 2011.  FAMILY HISTORY:  Father died age 40 with complications secondary to myocardial infarction.  Mother deceased at age 52 complications following myocardial infarction.  She has a brother 71 with hypertension and a sister 75 with hypertension and Parkinson's.  She has 3 children.  MEDICATIONS PRIOR TO ADMISSION: 1. Calcium carbonate and vitamin D 1200/1000 mg 1 tablet daily. 2. Glucosamine liquid daily. 3. Protonix 40 mg daily. 4. Catapres-TTS #2 patch q. 7 days. 5. Coumadin as directed. 6. Klor-Con 20 mEq daily. 7. Carvedilol 12.5 mg b.i.d. 8. Amiodarone 200 mg daily. 9. Flonase 50 mcg 2 sprays p.r.n. 10.Diltiazem 180 mg daily. 11.Tylenol p.r.n.  ALLERGIES:  CODEINE causes nausea and vomiting, DARVOCET causes upset stomach and a questionable history of an IODINE reaction.  SOCIAL HISTORY:  She is a former smoker.  She denies alcohol use.  She occasionally drinks caffeine.  She is retired and works part-time at the [employer] and at [employer].  She is divorced.  REVIEW OF SYMPTOMS:  Please see the history and physical done at the time  of admission.  PHYSICAL EXAMINATION:  Please see the history and physical done at the time of admission.  HOSPITAL COURSE:  The patient was admitted for right and left heart catheterization with results as described including normal coronary arteries, normal right heart catheterization pressures with large V- waves noted on pulmonary capillary wedge tracing.  Severe mitral regurgitation with markedly enlarged left atrium and also normal left ventricular function.  She was referred to Dr. Donata Clay for surgery. He also determined that she should have a combined procedure to  include aortic valve replacement due to aortic insufficiency.  PROCEDURE:  On April 29, 2010, she underwent the following operation: 1. Mitral valve repair with a 26-mm Edwards annuloplasty ring and a     posterior commissure plasty. 2. Left-sided maze procedure. 3. Ligation of left atrial appendage. 4. Aortic valve replacement for significant aortic insufficiency with     a 19-mm pericardial tissue valve. 5. 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  The patient did have complications of the aortic tear.  At the time of initial surgery, her chest was not completely closed,but covered with an Esmark-woundvac surgical covering.  The patient remained on the ventilator hemodynamically stable without inotropic support and was felt to be stable for closure of the chest on the day following her procedure.  This was done by Dr. Kathlee Nations Trigt as well.  POSTOPERATIVE HOSPITAL COURSE:  Since her sternal closure, she has had a stable course with slow and steady progression.  She was treated withinotropic support as well as ventilator and nitric oxide therapy.  All of these were weaned with time and she remained quite stable hemodynamically.  She was able to be extubated.  She has had postoperative atrial fibrillation and has been restarted on amiodarone as well as beta blocker and Coumadin.  Her pulmonary status has shown a gradual improvement.  She has required aggressive toilet as well as nebulized bronchodilators.  She has almost completely weaned from her oxygen but still remains at this time on 2 liters nasal cannula.  She has had postoperative thrombocytopenia and coagulopathy which has slowly improved with time initially postsurgically requiring multiple agents. She is stable in this regard, now.  Most recent platelet count is 305,000 on May 10, 2010.  She does have an  acute blood loss anemia. Her values are stabilized with most recent hemoglobin/hematocrit 8.2 and 24.8 respectively.  She is currently on iron supplementation. Additionally, postoperatively she has had some difficulty with swallowing.  This has progressed over time and most recent modified barium study allows her to have regular diet with a few restrictions to include no straws, drinking with small sips but she may have thin liquids and she also has instructions on tucking the chin with liquids. Her pills are given in applesauce as well.  She additionally has had some postoperative hoarseness and has been seen in ENT consultation with findings consistent with a left vocal cord paralysis/contusion.  Treatment for this will be conservative at this time but she should follow up with the ENT. Dr. Jearld Fenton 2-3 weeks after discharge to discuss any further options as it relates to her vocal cord paralysis.  At this time, she primarily requires rehab modalities including physical and occupational therapy. A post-op 2-D Echo shows good LV function with successful mitral repair and AVR. The plan at this time is  to tentatively send her to a skilled nursing facility in the next day or so for continued recovery and rehabilitation.  She is currently on Coumadin for the valves as well as postoperative atrial fibrillation which will need to be continued to be managed with an INR in the range of 2.5.  Most recent INR dated May 12, 2010, was 1.56 for which she will be receiving a 5 mg dose of Coumadin.  Tentatively, she will be transferred to the [place] nursing facility as stated in the next day or so.  MEDICATIONS:  At the time of this dictation include the following: 1. Amiodarone 200 mg p.o. b.i.d. 2. Aspirin 81 mg p.o. daily. 3. Dulcolax p.r.n. 4. Ensure nutritional supplement three times daily. 5. Nu-Iron 150 mg p.o. daily. 6. Lisinopril 20 mg p.o. daily. 7. Ultram 50 mg p.o. q.6 h. p.r.n. for  pain. 8. Calcium carbonate 1200 mg daily. 9. Carvedilol 12.5 mg b.i.d. 10.Clonidine patch 0.2 mg q.24 hours. 11.Flonase 2 sprays p.r.n. 12.Glucosamine 10 mg over-the-counter daily as previously. 13.Coumadin 5 mg one half to 1 tablet daily depending on INR.  Note,     the patient is currently in a sinus rhythm.  FINAL DIAGNOSES: 1. Severe mitral regurgitation now status post repair. 2. Moderate aortic insufficiency now status post pericardial tissue     valve replacement. 3. Status post Hemashield reconstruction of a tear in the descending     aorta at a cannulation site. 4. Status post left maze procedure. 5. Status post ligation of left atrial appendage.  OTHER DIAGNOSES: 1. Postoperative thrombocytopenia. 2. Postoperative volume overload. 3. Postoperative acute blood loss anemia. 4. Postoperative atrial fibrillation. 5. Postoperative left true vocal cord paralysis with ongoing     significant hoarseness. 6. Postoperative swallowing dysfunction, resolving. 7. History of hypertension. 8. History of dyslipidemia. 9. History of hiatal hernia. 10.History of diverticulitis. 11.History of gastroesophageal reflux. 12.History of osteopenia. 13.History of degenerative joint disease. 14.History of multiple surgeries as described above in the history.  FOLLOWUP:  The patient should see Dr. Donata Clay on Jun 02, 2010, at 10 a.m. with a chest x-ray from [facility].  These cards will be included in her discharge paperwork.  She should also follow with Dr. Armanda Magic.  Coumadin management can be done at the nursing facility by the medical staff there or they can be continued to call the results to Traci Turner's office for management.  INR should be obtained at least in the first couple of days upon transfer to [facility] nursing facility.  Other followup should include Dr. Jearld Fenton with the ENT 2-3 weeks post discharge for follow up for her vocal cord paralysis.  INSTRUCTIONS:   The patient should continue physical and occupational therapy, continue swallowing, rehab modalities/maneuvers for eating. She is currently on a regular diet with thin liquids.  Lifting restrictions of no more than 10 pounds until further seen by the surgeon.  She is not to drive prior to being seen by the surgeon.  Wound care incision may be cleaned gently with soap and water and patted dry gently.  Other activities:  She is encouraged to gradually increase her ambulation as able with assistance and close monitoring for fall after discharge and further followup.  She will be a probable candidate for further cardiac rehabilitation in the phase 2 program.     Rowe Clack, P.A.-C.   ______________________________ Kerin Perna, M.D.    WEG/MEDQ  D:  05/12/2010  T:  05/13/2010  Job:  [id number]  cc:   Sigmund Hazel, M.D. Armanda Magic, M.D.  Electronically Signed by Deniece Portela GOLD P.A.-C. on 05/19/2010 02:40:54 PM Electronically Signed by Kerin Perna M.D. on 05/24/2010 10:33:18 AM

## 2010-05-25 NOTE — Op Note (Signed)
NAMEADAMARIE, Ashley [name]               ACCOUNT NO.:  [id number]   MEDICAL RECORD NO.:  [id number]          PATIENT TYPE:  OIB   LOCATION:  2899                         FACILITY:  [facility]   PHYSICIAN:  Armanda Magic, M.D.     DATE OF BIRTH:  1940-10-11   DATE OF PROCEDURE:  01/22/2007  DATE OF DISCHARGE:                               OPERATIVE REPORT   PROCEDURE:  Direct current cardioversion.   SURGEON:  Armanda Magic, M.D.   INDICATIONS FOR PROCEDURE:  Atrial fibrillation.   COMPLICATIONS:  None.   IV MEDICATIONS:  Pentothal 175 mg IV.   This is a 70 year old female who has a history of chronic fatigue,  poorly controlled hypertension, gastroesophageal reflux disease,  dyslipidemia.  She was recently found to be in new onset atrial  fibrillation and has been on systemic anticoagulation for greater than  four weeks.  She has had a therapeutic INR for over four weeks and now  presents for cardioversion.   DESCRIPTION OF PROCEDURE:  The patient was brought to the day hospital  in a fasting nonsedated state.  Informed consent was obtained.  The  patient was connected to continuous heart rate and pulse oximetry  monitoring, intermittent blood pressure monitoring.  Defibrillation pads  were placed on the anterior and posterior left chest and back.  Anesthesia was present and delivered adequate anesthesia including  Pentothal 175 mg IV.  After adequate anesthesia was obtained, a 150  joule synchronized biphasic shock was delivered which successfully  converted the patient to sinus rhythm.  The patient tolerated the  procedure well without any complications.  Once the patient was awake  and ambulating, she was discharged to home.  She will continue on her  current medications which include:  1. Cartia XT 300 mg a day.  2. Zocor 40 mg a day.  3. Metoprolol 50 mg b.i.d.  4. Aciphex 20 mg a day.  5. Catapres patch TTS two weekly.  6. Minoxidil 2.5 mg b.i.d.  7. Benicar HCT 40/25 mg  daily.  8. Coumadin.   FOLLOWUP:  She will follow up with me in two weeks.      Armanda Magic, M.D.  Electronically Signed     TT/MEDQ  D:  01/22/2007  T:  01/22/2007  Job:  [id number]   cc:   [name]. [name], M.D.  Fax: [phone]

## 2010-05-25 NOTE — H&P (Signed)
NAME:  Ashley [name], Ashley [name]               ACCOUNT NO.:  [id number]   MEDICAL RECORD NO.:  [id number]           PATIENT TYPE:   LOCATION:                                 FACILITY:   PHYSICIAN:  Armanda Magic, M.D.     DATE OF BIRTH:  07/20/1940   DATE OF ADMISSION:  02/08/2007  DATE OF DISCHARGE:                              HISTORY & PHYSICAL   CHIEF COMPLAINT:  For sotalol load.   HISTORY OF PRESENT ILLNESS:  Ashley [name] is a 70 year old female  patient of Dr. Kennith Center Turner's with known history of recent atrial  fibrillation who presents for elective admission of sotalol loading.  She initially presented with her atrial fibrillation in November of 2008  on a routine physical.  She was asymptomatic.  She was placed on  metoprolol and Coumadin at that time and remained rate controlled.  She  underwent elective cardioversion on January 22, 2007 which was  successful.  However, on recheck back in the office on February 06, 2007,  she was found to be back in atrial fibrillation with controlled  ventricular response rate.  She has felt well without any chest pain,  pressure or palpitations.  Some fatigue.  She is being admitted for  sotalol load.   REVIEW OF SYSTEMS:  GENERAL:  Has felt fairly well.  No fever or chills.  HEENT:  No problems with hearing or vision.  No headaches or sinusitis.  Hearing present in both ears.  No tinnitus or earache.  NOSE:  No  obstruction.  MOUTH/TEETH/GUMS:  In good repair.  NECK:  No goiter or  hoarseness.  CARDIOVASCULAR:  As above.  Denies chest pain or pressure.  ABDOMEN:  No nausea, vomiting, dysphagia or heartburn.  No abdominal  pain, vomiting or diarrhea.  No constipation, melena, hematochezia or  hemorrhoids.  GU:  No dysuria, urinary frequency, hematuria, nocturia.  HEMATOLOGIC:  No bleeding or bruising tendencies.  No history of  frequent anemia.  MUSCULOSKELETAL:  No joint pain, stiffness, swelling  or deformity.  No muscle weakness or pain.   NEUROLOGIC:  No numbness,  tingling, seizures, memory loss, vertigo, tremors, lightheadedness or  syncope.   PAST MEDICAL HISTORY:  1. Hypertension.  2. Fatigue.  3. History of hypokalemia.  4. Dyslipidemia.  5. Hiatal hernia.  6. GERD.  7. Mild aortic stenosis with mild aortic insufficiency and associated      murmur.  8. Atrial fibrillation with successful cardioversion on January 22, 2007.  9. Systemic anticoagulation, followed in our Coumadin Clinic.   PAST SURGICAL HISTORY:  Status post umbilical hernia repair.   ALLERGIES:  None known except for a new topical reaction to DYE from  clothing.   CURRENT MEDICATIONS:  1. Cartia XT 300 mg a day.  2. Metoprolol 50 mg one p.o. b.i.d.  3. Aciphex 20 mg a day.  4. Catapres TTS 0.2 mg patch weekly.  5. Minoxidil 2.5 mg twice a day.  6. Benicar HCT 40/25 one a day.  7. Coumadin 5 mg alternating with 2.5 mg every other day.  8.  Calcium 600 mg two p.o. daily.   SOCIAL HISTORY:  She is divorced.  She is a Loss adjuster, chartered.  Former  smoker, having quit 13 years ago.  She had smoked anywhere from a half  to a pack a day for 10-15 years.  Rare EtOH and no illicit drug use.  She has grown children.   FAMILY HISTORY:  Mother died at age 66 of complications related to an  MI.  Father died at age 70 of the same.  Brother and sister have  hypertension but otherwise are healthy.   OBJECTIVE DATA:  VITAL SIGNS:  Weight 193.2, pulse 80 and irregular,  blood pressure 120/70.  GENERAL:  Very pleasant female in no acute distress.  GENERAL:  Skin warm and dry.  HEENT:  Ears, nose and throat unremarkable.  NECK:  No adenopathy.  Thyroid gland is not enlarged.  No mass or  irregularity palpated.  No carotid bruits are appreciated.  Carotid  upstrokes are +2 bilaterally.  HEART:  Normal S1-S2 with irregular rhythm, rate in the 80s.  I hear no  murmur, S3-S4 rub, click or gallop.  LUNGS:  Good excursion.  Clear throughout without rales,  wheezing or  rhonchi.  ABDOMEN:  Soft, nondistended.  Bowel sounds are present in all 4  quadrants.  No abdominal tenderness, no masses, no hepatosplenomegaly.  EXTREMITIES:  +2 distal pulses bilaterally.  No edema, no clubbing or  cyanosis.  NEUROLOGIC:  Cranial nerves II-XII intact.  Gait steady.  Mood is  appropriate.   ELECTROCARDIOGRAM:  EKG reveals atrial fibrillation, ventricular rate of  76.  There are a few multifocal PVCs.  No ST wave abnormalities.  (EKG  February 06, 2007).   ECHOCARDIOGRAM:  2-D cardiac echo, January 02, 2007, revealed an EF of  70%, mild aortic valve stenosis and mild aortic insufficiency, mild  mitral regurgitation, mild to moderate tricuspid regurgitation with mild  pulmonary hypertension.   STRESS CARDIOLITE:  A stress Cardiolite on December 12, 1986 was negative  for ischemia.   IMPRESSION:  1. Atrial fibrillation.  Controlled response rate.  2. Hypertension.  Control good.  3. Chronic Coumadin therapy, maintaining therapeutic levels.  4. Gastroesophageal reflux disease.  5. Dyslipidemia.   PLAN:  Admit to the hospital on February 08, 2007 for elective sotalol  load as above.  Procedure explained.  The patient voiced understanding  and wished to proceed.      Tamera C. Baruch Merl, M.D.  Electronically Signed    TCL/MEDQ  D:  02/07/2007  T:  02/08/2007  Job:  [id number]

## 2010-05-27 ENCOUNTER — Emergency Department (HOSPITAL_COMMUNITY): Payer: Medicare Other

## 2010-05-27 ENCOUNTER — Emergency Department (HOSPITAL_COMMUNITY)
Admission: EM | Admit: 2010-05-27 | Discharge: 2010-05-27 | Disposition: A | Discharge status: Home / Self Care | Payer: Medicare Other | Attending: Emergency Medicine | Admitting: Emergency Medicine

## 2010-05-27 DIAGNOSIS — Z7901 Long term (current) use of anticoagulants: Secondary | ICD-10-CM | POA: Insufficient documentation

## 2010-05-27 DIAGNOSIS — I2510 Atherosclerotic heart disease of native coronary artery without angina pectoris: Secondary | ICD-10-CM | POA: Insufficient documentation

## 2010-05-27 DIAGNOSIS — R110 Nausea: Secondary | ICD-10-CM | POA: Insufficient documentation

## 2010-05-27 DIAGNOSIS — I10 Essential (primary) hypertension: Secondary | ICD-10-CM | POA: Insufficient documentation

## 2010-05-27 DIAGNOSIS — R079 Chest pain, unspecified: Secondary | ICD-10-CM | POA: Insufficient documentation

## 2010-05-27 DIAGNOSIS — Z79899 Other long term (current) drug therapy: Secondary | ICD-10-CM | POA: Insufficient documentation

## 2010-05-27 DIAGNOSIS — Z7982 Long term (current) use of aspirin: Secondary | ICD-10-CM | POA: Insufficient documentation

## 2010-05-27 DIAGNOSIS — R070 Pain in throat: Secondary | ICD-10-CM | POA: Insufficient documentation

## 2010-05-27 LAB — CBC
HCT: 31.5 % — ABNORMAL LOW (ref 36.0–46.0)
MCH: 30.6 pg (ref 26.0–34.0)
MCHC: 33.7 g/dL (ref 30.0–36.0)
MCV: 91 fL (ref 78.0–100.0)
Platelets: 170 10*3/uL (ref 150–400)
RDW: 17.2 % — ABNORMAL HIGH (ref 11.5–15.5)

## 2010-05-27 LAB — DIFFERENTIAL
Eosinophils Absolute: 0.3 10*3/uL (ref 0.0–0.7)
Eosinophils Relative: 5 % (ref 0–5)
Lymphocytes Relative: 30 % (ref 12–46)
Lymphs Abs: 1.7 10*3/uL (ref 0.7–4.0)
Monocytes Absolute: 0.5 10*3/uL (ref 0.1–1.0)
Monocytes Relative: 8 % (ref 3–12)

## 2010-05-27 LAB — COMPREHENSIVE METABOLIC PANEL
AST: 24 U/L (ref 0–37)
Albumin: 3.3 g/dL — ABNORMAL LOW (ref 3.5–5.2)
Alkaline Phosphatase: 102 U/L (ref 39–117)
BUN: 24 mg/dL — ABNORMAL HIGH (ref 6–23)
Chloride: 105 mEq/L (ref 96–112)
GFR calc Af Amer: 57 mL/min — ABNORMAL LOW (ref >60)
Potassium: 3.5 mEq/L (ref 3.5–5.1)
Sodium: 139 mEq/L (ref 135–145)
Total Bilirubin: 0.4 mg/dL (ref 0.3–1.2)
Total Protein: 7 g/dL (ref 6.0–8.3)

## 2010-05-27 LAB — POCT CARDIAC MARKERS: CKMB, poc: <1 ng/mL — ABNORMAL LOW (ref 1.0–8.0)

## 2010-05-27 LAB — APTT: aPTT: 34 seconds (ref 24–37)

## 2010-05-27 LAB — PROTIME-INR: INR: 1.91 — ABNORMAL HIGH (ref 0.00–1.49)

## 2010-05-28 NOTE — H&P (Signed)
NAME:  Ashley [name], Ashley [name]                    ACCOUNT NO.:  [id number]   MEDICAL RECORD NO.:  [id number]                   PATIENT TYPE:  INP   LOCATION:  2025                                 FACILITY:  [facility]   PHYSICIAN:  Hortencia Pilar, M.D.                 DATE OF BIRTH:  23-Aug-1940   DATE OF ADMISSION:  08/17/2002  DATE OF DISCHARGE:                                HISTORY & PHYSICAL   CHIEF COMPLAINT:  Chest pain.   HISTORY OF PRESENT ILLNESS:  The patient is a 70 year old white female.  She  has a history of hypertension, aortic insufficiency, presenting to the  emergency room after calling EMS for chest pain and shortness of breath.  The patient states today she was [place] this [place].  She had  marked chest pain that was in the shoulder with associated shortness of  breath.  No nausea, vomiting or diaphoresis.  This lasted x10 minutes and  resolved.  She then went into a store, walked around for about 15 minutes,  had recurring chest pain symptoms radiating to the left shoulder.  Also had  shortness of breath.  At this time, she did have some diaphoresis.  No  nausea or vomiting.   The patient at that time had a friend with her.  EMS was called.  She got to  the emergency room, was given aspirin and nitroglycerin.  One nitroglycerin  relieved the pain.  The patient states that she has never had this type of  pain before.  She denies any cardiac workup in the past.  She does have  diagnosis of aortic insufficiency by echocardiogram.  She has not had  catheterization in over 20 years.  She has not had a stress test.  The  patient denies orthopnea, PND, or lower extremity edema.  The rest of her  review of systems is negative.   PAST MEDICAL HISTORY:  1. Aortic insufficiency.  2. Hypertension.  3. Osteoarthritis.   PAST SURGICAL HISTORY:  1. Cholecystectomy.  2. Neck surgery.  3. Clavicle surgery.  4. Knee surgery.   SOCIAL HISTORY:  She was a tobacco  user, quit eight years ago.  No alcohol  or drug use.   MEDICINES:  Cartia XT.  Benicar.  Bextra.  Zocor.   ALLERGIES:  No known drug allergies.   PHYSICAL EXAMINATION:  VITAL SIGNS:  Temperature 98.2, blood pressure  149/73, heart rate 67, respiratory rate 20, saturating 100% on 2 L.  GENERAL:  The patient is in no apparent distress.  Alert and oriented x3.  She has a normal affect.  HEENT:  Extraocular movements intact.  Pupils are equal and reactive to  light.  Oropharynx is clear.  NECK:  She has no JVD.  LUNGS:  Clear to auscultation bilaterally.  HEART:  Normal S1, S2.  She has a 3/6 systolic murmur at the left upper  sternal border.  She has no  diastolic murmur audible.  No S3.  ABDOMEN:  Soft and nontender.  Positive bowel sounds.  EXTREMITIES:  No edema.  SKIN:  No rashes.   LABORATORY DATA:  CK-MB 1.2, Troponin less than 0.05.  She has a full set of  laboratories pending including a CBC, CMET, and serial cardiac enzymes.   She had an EKG that shows normal sinus rhythm, normal axis.  No ST changes.   She had a chest x-ray that shows cardiac enlargement without CHF.   ASSESSMENT/PLAN:  This is a 70 year old white female with a history of  hypertension and aortic insufficiency, presenting with acute chest pain with  shortness of breath with resolution without nitroglycerin.   1. Cardiac:  A patient with chest pain, felt to be cardiac in nature.  The     patient has multiple risk factors for heart disease including     hypertension, high cholesterol, age, family history.  Will proceed with     ruling out with enzymes, obtain Cardiology consultation for study to     assess for heart disease.  2. Hypertension:  The patient had blood pressure elevation.  The patient had     Cartia and Benicar, unknown dose.  Tonight will put on Toprol b.i.d. and     will reassess other medications in the morning once we have her previous     doses documented.  3. High cholesterol:  Will  continue Zocor.                                                Hortencia Pilar, M.D.    SL/MEDQ  D:  08/17/2002  T:  08/17/2002  Job:  [id number]

## 2010-05-28 NOTE — Op Note (Signed)
   TNAMEMERIDEE, BRANUM                   ACCOUNT NO.:  [id number]   MEDICAL RECORD NO.:  [id number]                   PATIENT TYPE:  AMB   LOCATION:  DAY                                  [facility]   PHYSICIAN:  Vikki Ports, M.D.         DATE OF BIRTH:  1940/04/18   DATE OF PROCEDURE:  08/24/2001  DATE OF DISCHARGE:                                 OPERATIVE REPORT   PREOPERATIVE DIAGNOSES:  Umbilical hernia.   POSTOPERATIVE DIAGNOSIS:  Umbilical hernia.   PROCEDURE:  Umbilical hernia repair.   SURGEON:  Vikki Ports, M.D.   ANESTHESIA:  General.   DESCRIPTION OF PROCEDURE:  The patient was taken to the operating room,  placed in a supine position  and after general anesthesia was induced using  laryngeal mask, the abdomen was prepped and draped in the normal sterile  fashion. A transverse infraumbilical incision was made. I dissected down to  the subcutaneous tissue upon the hernia sac which was reduced into the  abdomen. The defect was less than 1 cm and was closed with two interrupted  #0 Surgilon figure-of-eight sutures. I felt because the defect was so small,  mesh was not required. Adequate hemostasis was assured and the skin was  closed with subcuticular 4-0 Monocryl. Steri-Strips and sterile dressings  were applied. The patient tolerated the procedure well and went to PACU in  good condition.                                               Vikki Ports, M.D.    KRH/MEDQ  D:  08/24/2001  T:  08/26/2001  Job:  [id number]

## 2010-05-28 NOTE — Op Note (Signed)
NAME:  Ashley [name], Ashley [name] NO.:  [id number]   MEDICAL RECORD NO.:  [id number]          PATIENT TYPE:  OUT   LOCATION:  CARD                         FACILITY:  [facility]   PHYSICIAN:  Oley Balm. Sung Amabile, M.D. [facility] OF BIRTH:  [birth year]   DATE OF PROCEDURE:  12/18/2003  DATE OF DISCHARGE:  12/18/2003                                 OPERATIVE REPORT   PROCEDURE:  Cardiopulmonary stress test.   INDICATION:  Exertional dyspnea.   DESCRIPTION OF PROCEDURE:  Cardiopulmonary stress testing was performed on a  graded treadmill.  Testing was stopped due to dyspnea.  Effort was  submaximal.  At peak exercise, oxygen uptake was 1.22 L or 71% of predicted  maximum, indicating mild exercise impairment.   At peak exercise, heart rate was 112 or 71% of predicted maximum, indicating  that cardiovascular reserve remained.  Oxygen pulse was normal, suggesting  normal stroke volume.  Blood pressure response was normal.  EKG tracings  revealed no significant ischemic changes or arrhythmias.   At peak exercise, minute ventilation was 49.2 Lpm or 56% of predicted  maximum, indicating that ventilatory reserve remained (see Caveat below).  Gas exchange parameters revealed no abnormalities.  Baseline spirometry and  postexercise spirometry were not performed.   SUMMARY:  Mild exercise impairment with a submaximal effort.  Limitation  appears to be effort dependent.  Spirometry was not performed.  If FEV1 is  less than 1.75 L, she might have a ventilatory limitation to explain her  exercise intolerance.      DBS/MEDQ  D:  12/26/2003  T:  12/26/2003  Job:  [id number]   cc:   Charlaine Dalton. Sherene Sires, M.D. [facility]   Arvilla Meres, M.D. [facility]

## 2010-05-28 NOTE — Op Note (Signed)
[facility]  Patient:    Ashley [name], Ashley [name] Visit Number: [id number] MRN: [id number]          Service Type: SUR Location: 4W [id number] 01 Attending Physician:  Lubertha South Dictated by:   Kerrin Champagne, M.D. Proc. Date: 11/07/00 Admit Date:  11/07/2000                             Operative Report  PREOPERATIVE DIAGNOSIS:  Herniated nucleus pulposus, C5-6.  POSTOPERATIVE DIAGNOSIS:  Herniated nucleus pulposus, C5-6.  PROCEDURE:  Anterior cervical diskectomy and fusion at C5-6 utilizing operating room microscope, spine concepts 24 mm four-hole plate with 14 mm wide base screws, a 7 mm long allograft dense cancellous bone graft.  SURGEON:  Kerrin Champagne, M.D.  ASSISTANTJill Side P. Mahar, P.A.-C.  ANESTHESIA:  GOT - Ninfa Meeker, M.D.  COMPLICATIONS:  None.  DRAINS:  Foley to straight drain and removed at the end of the case.  BRIEF CLINICAL HISTORY:  The patient is a 70 year old female who presents with a history of chronic bilateral neck and shoulder pain.  She has undergone previous bilateral shoulder surgery.  She works for [employer].  Apparently began having worsening discomfort associated with an increase in her overhead use of her arms.  She had been seen by Dr. Sheran Luz with conservative treatment.  Apparent MR study demonstrating disk degeneration of C5-6 with disk herniation central and right-sided.  The patient clinically complains of pain into both arms.  She has clinical signs of weakness in the biceps on both sides and weakness of pronation, right side greater than left.  Reflexes however remain normal.  She is brought to the operating room to undergo anterior diskectomy and fusion at the C5-6 level, and excision of HNP with fusion.  INTRAOPERATIVE FINDINGS:  She was found to have degenerative disk changes at C5-6, spur, and disk material extending posteriorly, central, and toward the right side causing right C6  nerve root compression and central cord compression.  A central disk protrusion was noted to enter a rent in the central portion of the annulus fibrosis posteriorly.  DESCRIPTION OF PROCEDURE:  After adequate general anesthesia and the patient in the beach chair, the neck in slight extension with five pounds of cervical halter traction, the [place] horse shoe was used, well-padded.  All pressure points were padded.  TED hose for the legs.  A Foley catheter placed prior to surgery.  The patient requested Foley catheter.  A standard prep with DuraPrep solution and draped in the usual manner, Iodine impregnated Vidrape, standard preoperative antibiotics with Ancef.  Incision over the left neck after infiltration with Marcaine 0.5% with 1:200,000 epinephrine, approximately 3 cm to 3.5 cm in length in line with the patients skin creases at the expected C5-6 level as determined by palpating the carotid tuberosity and the cricoid thyroid cartilage.  The incision was carried through the skin and subcutaneous layers down to the anterior fascial layer to the platysma muscle.  This was then divided in line with the skin incision through the platysma muscle. Metzenbaum scissors then used to carefully spread the subcutaneous layers down to the area that was at the interval between the trachea and esophagus and carotid sheath.  The carotid sheath was palpated and the interval between the trachea and esophagus medially and the carotid sheath laterally was then developed using blunt dissection and finger dissection.  The anterior aspect of the cervical  spine was then exposed.  Hand-held Clowards then used to further expose this area.  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  The border of the longus colli muscle was cauterized using bipolar electrocautery, and Kidner dissector and  Key elevator used to carefully tease the prevertebral fascia across the midline from the left side to right side at the expected C5-6 level.  Palpation of the anterior aspect of the cervical spine demonstrated anterior spurs present at the expected C5-6 level and a spinal needle, approximately 1 cm in length with the sheath cut to prevent it from being entered greater than 1 cm.  It was then placed at the C5-6 level.  An intraoperative radiograph demonstrating the needle at the C5-6 level.  Using hand-held Clowards, then the spinal needle was removed under direct observation.  A 15 blade scalpel was used to excise the anterior annular portion of the disk at the C5-6 level, and pituitary rongeurs used to remove this to continue identification at this level throughout the remainder of the case.  The medial border of the longus colli muscle was then carefully freed up bilaterally using Key elevator.  Bleeders controlled using bipolar electrocautery, and the McCullough retractor inserted with the foot of the retractor beneath the medial border of the longus colli muscle to protect the esophagus and allow for excellent exposure here.  A small area of cautery was performed over the anterior aspect of the vertebral body of C5 and C6.  Fourteen millimeter screw posts were then placed above C5 and C6 levels for distraction of the disk space.  A 3 mm Kerrison used to excise the anterior osteophytes off the inferior lip anteriorly at C5, and the superior lip anteriorly at C6.  Pituitary rongeurs were used to debride the disk space of disk material all the way back to the posterior annular fibers, and a 2-0 microcuret used to debride the endplates of cartilaginous material.  The cartilaginous endplates were completely excised down to bleeding bony endplates.  The operating room microscope was draped and brought into the field and loupe magnification then discontinued.  Then, under the operating  room microscope, the posterior annulus was noted to have a rent centrally.  It was from this area that a portion of the disk material was noted to be removed prior to  placement of the patients microscope.  The posterior annulus was then resected, right and left side using 2 mm Kerrisons.  The posterior lip osteophytes off the superior aspect of C6 posteriorly and the inferior aspect of C5 posteriorly were resected using 1 mm and 2 mm Kerrisons.  Foraminotomy performed on the right side at the C6 region, and then the posterior longitudinal ligament resected completely from the right side to left side, ensuring that all disk material had been removed posteriorly, centrally, and towards to the right side.  The right C6 nerve root was noted to have no further compression present and the central portion of the cord appeared to be well-decompressed.  The high speed bur then used to carefully bur the endplates inferiorly and superiorly to accept bone graft, and this was sounded the depth of the intervertebral disk space, and measured at 18 mm using a Cloward depth gauge.  Sounding 7 mm long sound appeared to be the best fit for the intervertebral disk space.  Then, a 7 mm long dense  cancellous allograft was brought on to the field and reactivated in normal saline solution.  After reactivation over five minutes, the graft was then impacted into place using the special graft holder.  An additional impactor then used to subset the graft an additional 1-2 mm.  Screw posts were then removed with the distraction system.  Bone wax applied to the bleeding screw post holes and the bleeders controlled using bipolar electrocautery.  A high speed bur was then used to excise the anterior lip osteophytes over the inferior aspect of C5, and anterior and superior aspect of C6, carefully smoothing this area to accept the plate.  The distance between the midportion and the anterior aspect of the vertebral body  of C5 and C6 were then measured between calipers, and a 28 mm length plate was chosen.  This plate already maintained normal contour of the cervical spine.  It was then placed against the anterior aspect of the cervical spine, packed into place, drill holes then placed, and 14 mm wide-based screws were inserted at C5 and then C6.  Note, that longitudinal traction of five pounds was removed prior to the placement of the plate.  Following this, then careful inspection demonstrated no active bleeding present.  The esophagus had normal appearance.  Irrigation was performed. The area of previous suture ligature appeared to be intact.  Soft tissues were allowed to fall back into place.  The platysmal layer approximated with interrupted 3-0 Vicryl sutures, the deep subcutaneous layer with interrupted 3-0 Vicryl suture.  Intraoperative radiograph obtained which demonstrated the plates and screws in good position and alignment without evidence of retropulsion or impingement on the spinal canal by either bony material or metallic hardware.  The skin was then closed with running subcuticular stitch of 4-0 Vicryl buried.  A tincture of Benzoin and Steri-Strips applied and 4 x 4s affixed to the skin with Hypafix tape.  The patient was placed in a [place] collar, reactivated, extubated, and returned to the recovery room in satisfactory condition.  Note, the Foley catheter was removed prior to the patients reactivation from the anesthesia. Dictated by:   Kerrin Champagne, M.D. Attending Physician:  Lubertha South DD:  11/07/00 TD:  11/08/00 Job: [id number]

## 2010-05-28 NOTE — Op Note (Signed)
NAME:  Ashley [name], Ashley [name]                    ACCOUNT NO.:  [id number]   MEDICAL RECORD NO.:  [id number]                   PATIENT TYPE:  AMB   LOCATION:  ENDO                                 FACILITY:  [facility]   PHYSICIAN:  Graylin Shiver, M.D.                DATE OF BIRTH:  Feb 01, 1940   DATE OF PROCEDURE:  02/26/2003  DATE OF DISCHARGE:                                 OPERATIVE REPORT   INDICATIONS FOR PROCEDURE:  Screening.  Informed consent was obtained after  explanation of the risks of bleeding, infection, and perforation.   PREMEDICATION:  Fentanyl 80 mcg IV, Versed 7 mg IV.  The patient was also  given ampicillin and gentamicin prior to the procedure because of history of  aortic heart murmur.   DESCRIPTION OF PROCEDURE:  With the patient in the left lateral decubitus  position, a rectal examination was performed, no masses were felt. The  Olympus colonoscope was inserted into the rectum and advanced around the  colon to the cecum.  Cecal landmarks were identified.  The cecum and  ascending colon were normal. The transverse colon was normal. The descending  colon, sigmoid, and rectum were normal.  She tolerated the procedure well  without complications.   IMPRESSION:  Normal colonoscopy to the cecum.   I would recommend a follow-up colonoscopy again 10 years.                                               Graylin Shiver, M.D.    SFG/MEDQ  D:  02/26/2003  T:  02/26/2003  Job:  [id number]   cc:   Almedia Balls. Fore, M.D.  [phone] [address] [place]  [place] [address]  Fax: [phone]

## 2010-05-28 NOTE — Discharge Summary (Signed)
NAME:  Ashley [name], SISTARE                    ACCOUNT NO.:  [id number]   MEDICAL RECORD NO.:  [id number]                   PATIENT TYPE:  INP   LOCATION:  2025                                 FACILITY:  [facility]   PHYSICIAN:  Jackie Plum, M.D.             DATE OF BIRTH:  [birth year]   DATE OF ADMISSION:  08/17/2002  DATE OF DISCHARGE:  08/19/2002                                 DISCHARGE SUMMARY   PRIMARY CARE PHYSICIAN:  Ashley [name]. Ashley [name], M.D.   DISCHARGE DIAGNOSES:  1. Chest pain:  Ruled out for cardiac ischemia.  2. Hiatal hernia.  3. Hypertension:  Controlled.  4. Aortic insufficiency:  Stable.   DISCHARGE MEDICATIONS:  1. Cartia XT.  2. Benicar.  3. Aleve.  4. Prilosec OTC.  5. Zocor.  6. Lisinopril 2.5 mg p.o. daily.  7. Lopressor 25 mg p.o. b.i.d.  8. Aspirin 325 mg p.o. daily.   ALLERGIES:  NKDA.   PROCEDURES:  None.   HISTORY OF PRESENT ILLNESS:  The patient had marked chest pain in the  shoulder with associated shortness of breath while driving her car on the  day of presentation.  She denies nausea, vomiting, or diaphoresis.  The pain  lasted approximately 10 minutes and resolved.  The patient then went into a  store and had recurrent chest pain symptoms radiating into the left shoulder  with shortness of breath and some mild diaphoresis, but no nausea or  vomiting.  At that time, EMS was called.  The patient was given aspirin and  nitroglycerin in the emergency room.  Nitroglycerin relieved the pain.  She  states that she has never had this type of pain before.  She denies any  prior cardiac workup in the past.  She denies orthopnea, PND, or lower  extremity edema.  The rest of the review of systems was negative.  The  patient was admitted to rule out MI.   HOSPITAL COURSE:  The patient was admitted to the telemetry unit.  A 12-lead  EKG in the emergency room showed no acute ST-T wave changes.  Cardiac  enzymes, a total of five sets, were performed  and found to be negative.  The  TSH was 2.730, also within normal limits.  A 2-D echocardiogram showed a  normal EF, but was suboptimal for evaluation of ventricular wall motion.   Armanda Magic, M.D., of cardiology performed a stress test on the patient,  the results of which she found to be satisfactory.  However, because of the  poor quality of her 2-D echocardiogram, Dr. Mayford Knife recommends repeating the  echocardiogram before the patient's follow-up appointment with Dr. Mayford Knife.   The patient's symptoms resolved overnight on her first night of admission.  She was completely pain-free and asymptomatic by the next morning.  Her  vital signs remained stable throughout her stay.  She remained afebrile.  On  telemetry monitoring, she remained in sinus rhythm with no acute ST-T  wave  changes and no significant ectopy.   At the time of discharge, the patient is free of chest pain, shortness of  breath, palpitations, nausea, and vomiting.  She states that she feels well  and verbalizes a good understanding of her discharge instructions.   DISCHARGE LABORATORY DATA:  As noted in narrative.   CONSULTS:  Armanda Magic, M.D., of cardiology.   CONDITION ON DISCHARGE:  Good.   DISPOSITION:  Discharged to home.   FOLLOWUP:  The patient is instructed to call [facility] to schedule an  echocardiogram with a follow-up appointment with Dr. Mayford Knife three to four  days after her echocardiogram is performed.  She is to follow up with Dr.  Abigail [name] p.r.n.      Ellender Hose. Christian Mate, M.D.    SMD/MEDQ  D:  08/19/2002  T:  08/20/2002  Job:  [id number]   cc:   Armanda Magic, M.D.  [address]  [place], [place] [address]  Fax: [phone]   Ashley [name]. Ashley [name], M.D.  [address]  [place]  [place] [address]  Fax: [phone]

## 2010-05-28 NOTE — Consult Note (Signed)
NAME:  Ashley [name], HAUTALA                    ACCOUNT NO.:  [id number]   MEDICAL RECORD NO.:  [id number]                   PATIENT TYPE:  INP   LOCATION:  2025                                 FACILITY:  [facility]   PHYSICIAN:  Armanda Magic, M.D.                  DATE OF BIRTH:  06/26/1940   DATE OF CONSULTATION:  08/18/2002  DATE OF DISCHARGE:                                   CONSULTATION   REFERRING PHYSICIAN:  Jackie Plum, M.D.   PRIMARY CARE PHYSICIAN:  [name]. [name], M.D.   CHIEF COMPLAINT:  Chest pain.   HISTORY:  This is a very pleasant 70 year old white female who has a history  of hypertension, aortic insufficiency, and a history of a heart  catheterization in 1980's, apparently she presented with chest pain back in  the 1980's and was found to have a mild dilated cardiomyopathy with aortic  insufficiency; because of the chest pain she underwent cardiac  catheterization revealing normal coronary arteries, but a dilated  cardiomyopathy and aortic insufficiency, this was done at [facility].  Since then she has been in her usual state of health  except for hypertension and hyperlipidemia which is being treated with diet.   On 08/17/2002 she was driving [place] [address] and had an episode of chest pain  radiating to the shoulder associated with shortness of breath, there was no  nausea, vomiting, diaphoresis, it lasted about 5-10 minutes and resolved.  She then went in [employer] and walked around for about 15 minutes and had  recurring chest pain only in the mid center of her chest, there was no  radiation of the pain at that time and she did have some shortness of  breath, and also some diaphoresis, no nausea or vomiting.  Again, the pain  only lasted 5-10 minutes.  EMS was called and she went to the emergency room  and was given aspirin and nitroglycerin, one nitroglycerin relieved the  pain.  She told them initially that she had never had this type of  chest  pain before, but then she says after thinking about it, her pain was very  similar to what she had back in the 1980's when she had her cardiac workup  done at that time including a catheterization which reportedly showed no  coronary artery disease.  Since her hospital stay she has ruled out for  myocardial infarction, she did have an episode of chest pain yesterday,  resolved with nitroglycerin the first time, and then a second episode was  resolved after Maalox.   PAST MEDICAL HISTORY:  1. Aortic insufficiency.  2. Dilated cardiomyopathy.  3. Hypertension.  4. Osteoarthritis.  5. Hyperlipidemia.   PAST SURGICAL HISTORY:  1. Cholecystectomy.  2. Neck surgery.  3. Clavicle surgery.  4. Knee surgery.   SOCIAL HISTORY:  She is a tobacco user, but quit eight years ago.  No  alcohol or drug  use.    MEDICATIONS:  1. Cartia XT.  2. Benicar.  3. Bextra.  4. Zocor.   ALLERGIES:  None.   FAMILY HISTORY:  Her father and mother both died of MI's in their 5's and  13's, respectively.  She has a brother and a sister with hypertension.   PHYSICAL EXAMINATION:  VITAL SIGNS:  Her blood pressure is 130/66, heart  rate is in the 60's, 02 saturation 100% on room air.  GENERAL:  This is a  well-developed, well-nourished female in no acute distress.  HEENT:  Benign.  NECK:  Supple without lymphadenopathy, carotid upstrokes +2 bilaterally, no  bruits.  LUNGS:  Clear to auscultation throughout.  HEART:  Regular rate and rhythm.  No rubs or gallops.  Normal S1, S2.  There  is a 2/6 diastolic murmur located at the right upper sternal border  radiating to the lower left sternal border.  ABDOMEN:  Soft, nontender, nondistended, active bowel sounds, no  hepatosplenomegaly.  EXTREMITIES:  No cyanosis, erythema or edema, normal distal pulses.   LABORATORY DATA:  Her cardiac enzymes are all negative.  Myoglobins are all  within normal limits.  MB's and troponin's are all within normal  limits.  TSH is normal at 2.73.  Chest x-ray shows cardiomegaly.  EKG shows sinus  bradycardia at 56 beats per minute with no ischemic T wave changes.   ASSESSMENT:  1. Chest pain of questionable etiology, gastrointestinal with esophageal     spasm versus coronary disease.  This patient had similar pain in the     1980's prompting heart catheterization at that time with normal coronary     arteries, but was found to have aortic insufficiency and dilated     cardiomyopathy.  Her cardiac risk factors include hypertension,     hyperlipidemia and family history.  2. History of aortic insufficiency with dilated cardiomyopathy.  3. Hypertension.  4. Hyperlipidemia.   PLAN:  1. Stress Cardiolite study to rule out inducible ischemia.  2. A 2D echocardiogram to assess the aortic valve.                                               Armanda Magic, M.D.    TT/MEDQ  D:  08/19/2002  T:  08/19/2002  Job:  [id number]   cc:   [name]. [name], M.D.  [address]  [place]  [place] [address]  Fax: [phone]

## 2010-05-28 NOTE — H&P (Signed)
[facility]  Patient:    Ashley [name], Ashley [name] Visit Number: [id number] MRN: [id number]          Service Type: Attending:  Kerrin Champagne, M.D. Dictated by:   Alexzandrew L. Perkins, P.A.-C. Adm. Date:  11/07/00   CC:         [name]. [name], M.D.   History and Physical  CHIEF COMPLAINT:  Neck pain and bilateral shoulder pain.  HISTORY OF PRESENT ILLNESS:  The patient is a 70 year old female who has been seen and evaluated by Dr. Vira Browns for neck and bilateral shoulder pain. She has been doing a lot of lifting with her job since 37.  She has been experiencing steady neck pain with radiation into the shoulders for some time now.  Pain has been progressively worse.  She has been seen and evaluated by Dr. Ethelene Hal in our office and undergone several attempts at conservative measurement.  She has previously undergone selective nerve root blocks which seem to relieve her pain for short periods of time; however, she continues to have recurrence of her pain.  She was sent for MRI of the cervical spine which did show some narrowing and degenerative disk disease at C5-6.  There was also a broad-based central disk protrusion, central and right at the same level. The patient continues to remain symptomatic.  She has had continued pain.  it is felt she would benefit from undergoing surgical intervention.  Risks and benefits of the procedure have been discussed with the patient and she has elected to proceed with surgery.  ALLERGIES:  No known drug allergies.  CURRENT MEDICATIONS: 1. Estropipate 0.625 mg daily. 2. Calcium 600 mg b.i.d. 3. Arthrotec 50 mg t.i.d., stopped prior to surgery. 4. Zoloft 100 mg one half tablet daily. 5. Cardia XT 300 mg daily. 6. Lisinopril. 7. Hydrochlorothiazide 10/12.5 daily.  PAST MEDICAL HISTORY: 1. History of jaundice in her teenage years. 2. Heart murmur which is an aortic insufficiency. 3. Hypertension. 4. History of  bronchitis. 5. Varicose veins. 6. Hemorrhoids.  PAST SURGICAL HISTORY: 1. Left knee surgery x 2. 2. Left shoulder surgery. 3. Right shoulder surgery. 4. Cholecystectomy.  SOCIAL HISTORY:  She is divorced, has two children, denies use of tobacco products or alcohol products.  She works in the [employer] at [employer].  FAMILY HISTORY:  Father deceased at 35 with a history of diabetes and hypertension.  Mother deceased at 55 with a history of hypertension.  She has a brother, 65, with hypertension, and a sister, 56, with hypertension.  REVIEW OF SYSTEMS:  GENERAL:  No fevers, chills, night sweats.  NEUROLOGIC: No seizures, _________, paralysis.  The patient does have radiation of pain from the neck into both shoulders.  RESPIRATORY:  No shortness of breath, productive cough, or hemoptysis.  CARDIOVASCULAR:  She has a rare occasional flutter with her heart which she associates with her aortic insufficiency and heart murmur.  No chest pain, angina, or orthopnea.  GASTROINTESTINAL:  She does have some intermittent diarrhea.  No constipation, no bloody mucous in the stool.  No nausea or vomiting.  GENITOURINARY:  No dysuria, hematuria, or discharge.  MUSCULOSKELETAL:  Pertinent to the neck and both arms from the history of present illness.  PHYSICAL EXAMINATION:  VITAL SIGNS:  Pulse 78, respirations 16, blood pressure 158/82.  GENERAL:  The patient is a 70 year old white female, well-nourished, well-developed, appears to be in no acute distress.  She is alert, oriented, and cooperative at the time of this exam.  HEENT:  Normocephalic, atraumatic.  Pupils are round and reactive.  Oropharynx is clear.  NECK:  Supple.  There are faint bilateral carotid bruits which are likely referred from the thoracic cavity due to her aortic insufficiency.  CHEST:  Clear to auscultation, anterior and posterior chest walls.  HEART:  Regular rate and rhythm with a murmur of 2-3/6, best  heard at aortic and pulmonic points on the anterior chest wall.  ABDOMEN:  Soft, nontender.  Bowel sounds are present.  No rebound or guarding.  RECTAL/BREASTS/GENITALIA:  Not done, not pertinent to present illness.  EXTREMITIES:  _________ at the neck.  She has decreased range of motion due to pain with flexion of the chin to the chest, approximately three fingers breadth.  She does have decreased lateral bending and rotation.  There is a slight decrease in bicep strengthening in both upper extremities.  Otherwise 5/5 muscle testing throughout both upper extremities.  She has discomfort with shoulder shrug, weakness with pronation and the right greater than left.  IMPRESSION: 1. Central herniated nucleus pulposus, C5-6. 2. Heart murmur, aortic insufficiency. 3. Hypertension. 4. History of bronchitis.  PLAN:  The patient will be admitted to [facility] to undergo an anterior cervical diskectomy with fusion at C5-6 with allograft bone graft. Surgery will be performed by Dr. Vira Browns.  The patients medical physician is Dr. Henrine Screws.  Dr. [name] will be notified of the room number on admission and will be consulted if we need medical assistance with this patient throughout the hospital course.Dictated by:   Alexzandrew L. Perkins, P.A.-C. Attending:  Kerrin Champagne, M.D. DD:  11/03/00 TD:  11/04/00 Job: [id number]

## 2010-05-28 NOTE — Op Note (Signed)
NAME:  Ashley [name], Ashley [name]               ACCOUNT NO.:  [id number]   MEDICAL RECORD NO.:  [id number]          PATIENT TYPE:  AMB   LOCATION:  DAY                          FACILITY:  [facility]   PHYSICIAN:  Alfonse Ras, MD   DATE OF BIRTH:  [birth year]   DATE OF PROCEDURE:  04/12/2005  DATE OF DISCHARGE:                                 OPERATIVE REPORT   PREOPERATIVE DIAGNOSIS:  Umbilical hernia.   POSTOP DIAGNOSIS:  Umbilical hernia.   PROCEDURE:  Umbilical hernia repair with mesh.   SURGEON:  Alfonse Ras, MD   ANESTHESIA:  General.   DESCRIPTION:  The patient was taken to the operating room and placed in the  supine position after adequate anesthesia was induced using an endotracheal  tube.  The abdomen was prepped and draped in a normal sterile fashion.  Using a transverse incision through the previous scar I dissected down onto  a very small hernia sac.  A little preperitoneal fat was encountered and  reduced back into the abdominal cavity.  The defect was less than 1 cm in  diameter and then the fascial edges were freed up.  It was closed primarily  with interrupted #1 Surgilon sutures.  I did not feel like, with a very  small nature of this, that it needed mesh.  The skin was then closed with  subcuticular 3-0 Monocryl.  Steri-Strips and sterile dressings were applied.  The patient tolerated the procedure well with PACU in good condition.      Alfonse Ras, MD  Electronically Signed     KRE/MEDQ  D:  04/12/2005  T:  04/13/2005  Job:  [id number]

## 2010-06-01 ENCOUNTER — Other Ambulatory Visit: Payer: Self-pay | Admitting: Cardiothoracic Surgery

## 2010-06-01 DIAGNOSIS — I359 Nonrheumatic aortic valve disorder, unspecified: Secondary | ICD-10-CM

## 2010-06-02 ENCOUNTER — Ambulatory Visit (INDEPENDENT_AMBULATORY_CARE_PROVIDER_SITE_OTHER): Payer: Medicare Other | Admitting: Cardiothoracic Surgery

## 2010-06-02 DIAGNOSIS — I4891 Unspecified atrial fibrillation: Secondary | ICD-10-CM

## 2010-06-02 DIAGNOSIS — I359 Nonrheumatic aortic valve disorder, unspecified: Secondary | ICD-10-CM

## 2010-06-02 DIAGNOSIS — I059 Rheumatic mitral valve disease, unspecified: Secondary | ICD-10-CM

## 2010-06-03 NOTE — Assessment & Plan Note (Signed)
OFFICE VISIT  JORDAN, CARAVEO DOB:  1940-11-26                                        Jun 02, 2010 CHART #:  [id number]  CURRENT PROBLEMS: 1. 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 2. Prolonged postoperative recovery due to fluid retention, hoarseness     and deconditioning, and recurrent atrial fibrillation. 3. Prior history of hypertension, cervical fusion, umbilical hernia     repair x2, cholecystectomy, and bilateral shoulder surgery.  PRESENT ILLNESS:  The patient is a very nice 70 year old female who returns for her first office visit following her above complex cardiac surgery.  She is back at home with home health support and is progressing nicely.  Her voice has regained increased strength.  She denies any difficulty swallowing.  Her weight has been stable.  She denies any angina symptoms of CHF.  She did have some lower back discomfort and was evaluated in the emergency room approximately a week ago with negative findings and a normal chest x-ray.  She has been on Coumadin due to her valve surgery and her atrial fibrillation without bleeding complications and her INR has been adjusted by [facility].  Her last INR was 2.2.  Overall, she is making good progress, was not yet ready to drive or start outpatient rehab.  Her children are staying close by.  MEDICATIONS: 1. Diltiazem 180 mg daily. 2. Amiodarone 200 mg daily. 3. Coreg 12.5 b.i.d. 4. Coumadin 5 mg daily. 5. Catapres patch 0.2 mg weekly. 6. Tylenol p.r.n. pain. 7. Lipitor 20 mg. 8. Protonix 40 mg daily.  PHYSICAL EXAMINATION:  Vital Signs:  Blood pressure 140/80, pulse 70 and regular, respirations 18, saturation 97% on room air.  General:  She appears to be feeling very well.  HEENT:   Normocephalic.  Her voice is slightly worse but much improved from previously.  Breath sounds are clear and equal.  Cardiac:  Regular rhythm without evidence of MR.  She has a soft flow murmur in the right upper sternal border from flow through the aortic prosthesis.  Her surgical incision is well healed. Extremities:  She has no pedal edema.  She has a discoloration over the dorsum of her right hand where an IV infiltrated at her emergency room visit.  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  Otherwise, neurologic exam is intact.  IMAGING:  Her last chest x-ray in the emergency department is checked and shows no significant pleural effusions or CHF.  IMPRESSION:  So far, the patient has had a good recovery home.  She will be hopefully ready to start rehab in another couple of weeks.  I will see her back in the office in 2 weeks for followup.  She may need to be reevaluated by Dr. Jearld Fenton for her hoarseness, but things may improve on their own.  I have encouraged her to increase her walking intervals at home, avoid excess salt, and to continue her current medications.  We did stop her iron and potassium due to her tendency to have nausea, and she is not currently on Lasix.  I will see  her back in 2 weeks with a chest x-ray.  Kerin Perna, M.D. Electronically Signed  PV/MEDQ  D:  06/02/2010  T:  06/03/2010  Job:  [id number]  cc:   Armanda Magic, M.D. Hillis Range, MD

## 2010-06-03 NOTE — Discharge Summary (Signed)
NAME:  Ashley [name], BIANCARDI NO.:  [id number]  MEDICAL RECORD NO.:  [id number]           PATIENT TYPE:  I  LOCATION:  2024                         FACILITY:  [facility]  PHYSICIAN:  Kerin Perna, M.D.  DATE OF BIRTH:  1940-02-05  DATE OF ADMISSION:  04/27/2010 DATE OF DISCHARGE:                              DISCHARGE SUMMARY   ADDENDUM: The patient was dictated in anticipation of transfer to skilled nursing facility, [facility].  Morning plan transfer the patient developed increased nausea.  She is still noted to be in atrial fibrillation. Following day the patient converted back to normal sinus rhythm but nausea persisted.  Amiodarone and iron were placed on hold at that time. With stopping his amiodarone the patient did not improve.  She went back into poor rate-controlled atrial fibrillation.  Due to the nausea continuing after the amiodarone was stopped it was felt that this was not the culprit and p.o. amiodarone was started a.m.  Her Coreg was increased.  Unfortunately, the patient remained in poor rate-controlled atrial fibrillation.  She remained on Coumadin.  Cardiology was following and felt that she would benefit from cardioversion. Currently, this plan today May 19, 2010.  Also during this time, the patient's Lasix had been stopped.  BNP level was obtained and noted to be 3928.  She was restarted on daily dose of diuretics.  She was also started on Aldactone.  Followup BNP on May 18, 2010, was 254.9.  The patient did receive several doses of Zaroxolyn as well.  We will plan to continue Lasix and Aldactone.  During this time, the patient's nausea had resolved.  She was tolerating current diet.  During this time, the patient has been up ambulating with cardiac rehab.  She was slowly progressing.  Vital signs followed and she has remained afebrile and blood pressure well controlled during this time.  The patient still remains on nasal cannula oxygen.  We will  continue to wean off oxygen to maintain O2 saturations greater than 90% on room air.  The patient did have a followup chest x-ray obtained on May 17, 2010, which showed stable moderate bilateral pleural effusions associated with passive atelectasis in the lower lobe.  There are no new abnormalities noted.  The patient has remained on Coumadin and daily PT/INR levels were obtained. Coumadin continued to be adjusted appropriately.  On May 19, 2010, the patient has planned for cardioversion today by Cardiology.  Her most recent lab work shows INR of 2.0.  Sodium of 138, potassium 3.4, chloride of 98, bicarbonate 34, BUN of 14, creatinine 1.06, glucose of 95.  White blood cell count of 5.9, hemoglobin of 8.3, hematocrit 26.0, plate count 981.  Case Management did reevaluate the patient prior to cardioversion and currently discussing possible discharge to home with home health versus skilled nursing facility.  We will continue to work to make sure all arrangements are set up prior to discharge.  The patient is tentatively ready for discharge in the next 24-48 hours pending stable following cardioversion.  Please see dictated discharge summary for followup appointments and discharge instructions.  DISCHARGE MEDICATIONS: 1. Amiodarone  200 mg b.i.d. 2. Enteric-coated aspirin 81 mg daily. 3. Dulcolax 10 mg daily p.r.n. 4. Coreg 25 mg b.i.d.. 5. One can of Ensure t.i.d. 6. Lasix 40 mg daily. 7. Nu-Iron 150 mg daily. 8. Lisinopril 20 mg b.i.d. 9. Phenergan 12.5 mg q. 6 hours p.r.n. 10.Aldactone 25 mg daily. 11.Ultram 50 mg 1-2 tablets q.6 h. p.r.n. pain. 12.Coumadin 2 mg daily. 13.Calcium carbonate 1200 mg daily. 14.Clonidine patch 0.2 mg/24 hours change weekly. 15.Flonase nasal spray 2 sprays daily p.r.n. 16.Glucosamine liquid 10 mg daily. 17.Protonix 40 mg daily. 18.Potassium chloride 20 mEq daily.     Sol Blazing, PA   ______________________________ Kerin Perna,  M.D.    KMD/MEDQ  D:  05/19/2010  T:  05/19/2010  Job:  [id number]  cc:   Armanda Magic, M.D.  Electronically Signed by Cameron Proud PA on 05/25/2010 02:12:53 PM Electronically Signed by Kerin Perna M.D. on 06/03/2010 09:44:26 AM

## 2010-06-15 ENCOUNTER — Other Ambulatory Visit: Payer: Self-pay | Admitting: Cardiothoracic Surgery

## 2010-06-15 DIAGNOSIS — I059 Rheumatic mitral valve disease, unspecified: Secondary | ICD-10-CM

## 2010-06-16 ENCOUNTER — Ambulatory Visit: Payer: Medicare Other | Admitting: Cardiothoracic Surgery

## 2010-06-17 ENCOUNTER — Ambulatory Visit
Admission: RE | Admit: 2010-06-17 | Discharge: 2010-06-17 | Disposition: A | Discharge status: Home / Self Care | Payer: Medicare Other | Source: Ambulatory Visit | Attending: Cardiothoracic Surgery | Admitting: Cardiothoracic Surgery

## 2010-06-17 ENCOUNTER — Ambulatory Visit (INDEPENDENT_AMBULATORY_CARE_PROVIDER_SITE_OTHER): Payer: Self-pay | Admitting: Cardiothoracic Surgery

## 2010-06-17 DIAGNOSIS — I4891 Unspecified atrial fibrillation: Secondary | ICD-10-CM

## 2010-06-17 DIAGNOSIS — I059 Rheumatic mitral valve disease, unspecified: Secondary | ICD-10-CM

## 2010-06-17 DIAGNOSIS — I359 Nonrheumatic aortic valve disorder, unspecified: Secondary | ICD-10-CM

## 2010-06-18 ENCOUNTER — Other Ambulatory Visit: Payer: Self-pay | Admitting: Cardiothoracic Surgery

## 2010-06-18 DIAGNOSIS — I050 Rheumatic mitral stenosis: Secondary | ICD-10-CM

## 2010-06-18 NOTE — Assessment & Plan Note (Signed)
OFFICE VISIT  BENAY, POMEROY DOB:  [birth year]                                        June 17, 2010 CHART #:  [id number]  CURRENT PROBLEMS: 1. Status post mitral valve repair, aortic valve replacement and maze     procedure, April 27, 2010. 2. Postoperative left pleural effusion with shortness of breath and     cough, recent onset. 3. Postoperative atrial fibrillation converted to sinus rhythm with     cardioversion.  PRESENT ILLNESS:  The patient returns for a second postop office visit. She was last seen 2 weeks ago at which time, she was found to be in sinus rhythm with improved hoarseness, improved deconditioning and less edema.  A chest x-ray at that time showed a small left pleural effusion. She has been on Coumadin and her INR at that time was 2.2.  Today, her INR is 3.6.  Since her previous visit, she has developed some shortness of breath, orthopnea and dry cough and her chest x-ray shows a new left moderate to large pleural effusion also with some cardiomegaly.  On exam, her ankle swelling has increased.  She has not been on any Lasix since her last visit.  CURRENT MEDICATIONS: 1. Diltiazem 100 mg p.o. daily. 2. Amiodarone 200 mg daily. 3. Coreg 12.5 b.i.d. 4. Coumadin 5 mg daily. 5. Catapres patch 0.2 mg weekly. 6. Tylenol. 7. Lipitor 20 mg. 8. Protonix 40 mg a day.  PHYSICAL EXAMINATION:  Vital Signs:  Blood pressure 150/80, pulse 78 and regular, respirations 18, saturation on room air is 94%.  General:  She appears to be feeling fairly well, but has a dry cough.  Her breath sounds are diminished on the left.  Right breath sounds are clear. Cardiac:  Rhythm is regular.  She has a soft flow murmur through the aortic prosthesis.  No murmur of mitral regurgitation.  Ankle edema is 1- 2+.  PA and lateral chest x-ray shows a new left moderate pleural effusion.  PLAN:  The patient has had some fluid retention without increase in weight  and a increasing left pleural effusion after stopping her diuretics.  She would benefit from a left thoracentesis.  However, her INR today is 3.6.  We will have her stop the Coumadin and I will provide her the prescription for Lasix and a short course of Zaroxolyn as well and plan on her returning to the office on Monday, June 21, 2010, for a left thoracentesis after a chest x-ray is rechecked.  We will also increase her Coreg from 12.5-18.75 mg b.i.d. due to her hypertension.  Kerin Perna, M.D. Electronically Signed  PV/MEDQ  D:  06/17/2010  T:  06/18/2010  Job:  [id number]  cc:   Armanda Magic, M.D. Sigmund Hazel, M.D.

## 2010-06-21 ENCOUNTER — Ambulatory Visit (INDEPENDENT_AMBULATORY_CARE_PROVIDER_SITE_OTHER): Payer: Self-pay | Admitting: Cardiothoracic Surgery

## 2010-06-21 ENCOUNTER — Ambulatory Visit
Admission: RE | Admit: 2010-06-21 | Discharge: 2010-06-21 | Disposition: A | Discharge status: Home / Self Care | Payer: Medicare Other | Source: Ambulatory Visit | Attending: Cardiothoracic Surgery | Admitting: Cardiothoracic Surgery

## 2010-06-21 ENCOUNTER — Other Ambulatory Visit: Payer: Self-pay | Admitting: Cardiothoracic Surgery

## 2010-06-21 DIAGNOSIS — Z9889 Other specified postprocedural states: Secondary | ICD-10-CM

## 2010-06-21 DIAGNOSIS — I050 Rheumatic mitral stenosis: Secondary | ICD-10-CM

## 2010-06-21 DIAGNOSIS — J90 Pleural effusion, not elsewhere classified: Secondary | ICD-10-CM

## 2010-06-21 DIAGNOSIS — I359 Nonrheumatic aortic valve disorder, unspecified: Secondary | ICD-10-CM

## 2010-06-21 DIAGNOSIS — I059 Rheumatic mitral valve disease, unspecified: Secondary | ICD-10-CM

## 2010-06-22 NOTE — Assessment & Plan Note (Signed)
OFFICE VISIT  Ashley [name], Ashley [name] DOB:  [birth year]                                        June 21, 2010 CHART #:  [id number]  CURRENT PROBLEMS: 1. Postoperative left pleural effusion, status post mitral valve     repair, aortic valve replacement and maze procedure. 2. Postoperative Coumadin for her maze procedure and mitral valve     repair now on hold.  PRESENT ILLNESS:  The patient is a 70 year old female who was seen in the office 3 days ago with shortness of breath, nonproductive cough and orthopnea and was found to have a large left pleural effusion.  Her INR at that time was 3.4, so thoracentesis was not performed.  She was given a course of Lasix and Zaroxolyn over the weekend with diuresis and some improvement in symptoms, but not resolution.  Her chest x-ray today shows slight decrease in left pleural effusion, but she returns now for thoracentesis after she has been off the Coumadin for the past 4 days.  After informed consent was obtained, a left thoracentesis was performed which drained 700 mL of serosanguineous fluid.  Her chest x-ray following this showed resolution of the left pleural effusion without pneumothorax.  Otherwise, on exam, she is in sinus rhythm with blood pressure 120/80, pulse of 70, saturation 95% on room air and no murmur of mitral regurgitation.  She has trace ankle edema.  PLAN:  The patient will return for followup in 4 days with a chest x-ray and continue her Lasix 40 mg a day.  Kerin Perna, M.D. Electronically Signed  PV/MEDQ  D:  06/21/2010  T:  06/22/2010  Job:  [id number]  cc:   Armanda Magic, M.D.

## 2010-06-24 ENCOUNTER — Other Ambulatory Visit: Payer: Self-pay | Admitting: Cardiothoracic Surgery

## 2010-06-24 DIAGNOSIS — J90 Pleural effusion, not elsewhere classified: Secondary | ICD-10-CM

## 2010-06-25 ENCOUNTER — Ambulatory Visit
Admission: RE | Admit: 2010-06-25 | Discharge: 2010-06-25 | Disposition: A | Discharge status: Home / Self Care | Payer: Medicare Other | Source: Ambulatory Visit | Attending: Cardiothoracic Surgery | Admitting: Cardiothoracic Surgery

## 2010-06-25 ENCOUNTER — Encounter (INDEPENDENT_AMBULATORY_CARE_PROVIDER_SITE_OTHER): Payer: Self-pay | Admitting: Cardiothoracic Surgery

## 2010-06-25 DIAGNOSIS — I359 Nonrheumatic aortic valve disorder, unspecified: Secondary | ICD-10-CM

## 2010-06-25 DIAGNOSIS — I4891 Unspecified atrial fibrillation: Secondary | ICD-10-CM

## 2010-06-25 DIAGNOSIS — J90 Pleural effusion, not elsewhere classified: Secondary | ICD-10-CM

## 2010-06-25 DIAGNOSIS — I059 Rheumatic mitral valve disease, unspecified: Secondary | ICD-10-CM

## 2010-06-28 ENCOUNTER — Ambulatory Visit (HOSPITAL_COMMUNITY): Payer: Medicare Other

## 2010-06-28 NOTE — Assessment & Plan Note (Signed)
OFFICE VISIT  Ashley [name], VASUDEVAN DOB:  1940/05/11                                        June 27, 2010 CHART #:  [id number]  CURRENT PROBLEMS: 1. Left pleural effusion, status post mitral valve repair and aortic     valve replacement treated with thoracentesis June 21, 2010. 2. History of atrial fibrillation treated with direct current     cardioversion and maze procedure currently in sinus rhythm. 3. 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  PRESENT ILLNESS:  The patient returns for further followup after her mitral valve repair combined with aortic valve replacement and maze procedure last month.  She is progressing well.  She had a large left pleural effusion which was bloody and was treated with a successful thoracentesis and at that time we stopped her Coumadin.  She has remained in sinus rhythm and I feel that she should not go back on the Coumadin due to the risk of recurrent bloody pleural effusion.  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  This is still quite painful and we will start her on Neurontin 400 mg p.o. b.i.d.  Otherwise, she is stable on her current medications including Tylenol, Protonix, Catapres, Flonase, Coreg, amiodarone 200 mg a day and diltiazem 180 mg a day.  PHYSICAL EXAMINATION:  Vital Signs:  Blood pressure 112/70, pulse 65, respirations 18, saturation 98%.  General Appearance:  Well-kempt female in no distress.  Lungs:  Breath sounds are clear and equal.  Cardiac: Rhythm is regular and there is no murmur.  Extremities:  The sternal and leg incisions are healed and there is no pedal edema.  Chest x-ray shows resolution of pleural effusion.  PLAN:  The patient with trial Neurontin 400 mg p.o. b.i.d. for  her dysesthesia pain and start outpatient cardiac rehab, resume driving and light activities and lifting.  I will plan on seeing her back in 2-3 weeks.  Kerin Perna, M.D. Electronically Signed  PV/MEDQ  D:  06/27/2010  T:  06/27/2010  Job:  [id number]

## 2010-06-30 ENCOUNTER — Ambulatory Visit (HOSPITAL_COMMUNITY): Payer: Medicare Other

## 2010-07-02 ENCOUNTER — Ambulatory Visit (HOSPITAL_COMMUNITY): Payer: Medicare Other

## 2010-07-05 ENCOUNTER — Ambulatory Visit (HOSPITAL_COMMUNITY): Payer: Medicare Other

## 2010-07-06 ENCOUNTER — Inpatient Hospital Stay (HOSPITAL_COMMUNITY)
Admission: EM | Admit: 2010-07-06 | Discharge: 2010-07-12 | DRG: 291 | Disposition: A | Discharge status: Home / Self Care | Payer: Medicare Other | Attending: Internal Medicine | Admitting: Internal Medicine

## 2010-07-06 ENCOUNTER — Emergency Department (HOSPITAL_COMMUNITY): Payer: Medicare Other

## 2010-07-06 DIAGNOSIS — I5033 Acute on chronic diastolic (congestive) heart failure: Principal | ICD-10-CM | POA: Diagnosis present

## 2010-07-06 DIAGNOSIS — I4891 Unspecified atrial fibrillation: Secondary | ICD-10-CM | POA: Diagnosis present

## 2010-07-06 DIAGNOSIS — K449 Diaphragmatic hernia without obstruction or gangrene: Secondary | ICD-10-CM | POA: Diagnosis present

## 2010-07-06 DIAGNOSIS — Z7982 Long term (current) use of aspirin: Secondary | ICD-10-CM

## 2010-07-06 DIAGNOSIS — N390 Urinary tract infection, site not specified: Secondary | ICD-10-CM | POA: Diagnosis present

## 2010-07-06 DIAGNOSIS — R0902 Hypoxemia: Secondary | ICD-10-CM | POA: Diagnosis present

## 2010-07-06 DIAGNOSIS — I509 Heart failure, unspecified: Secondary | ICD-10-CM | POA: Diagnosis present

## 2010-07-06 DIAGNOSIS — G4700 Insomnia, unspecified: Secondary | ICD-10-CM | POA: Diagnosis present

## 2010-07-06 DIAGNOSIS — Z7901 Long term (current) use of anticoagulants: Secondary | ICD-10-CM

## 2010-07-06 DIAGNOSIS — A498 Other bacterial infections of unspecified site: Secondary | ICD-10-CM | POA: Diagnosis present

## 2010-07-06 DIAGNOSIS — M949 Disorder of cartilage, unspecified: Secondary | ICD-10-CM | POA: Diagnosis present

## 2010-07-06 DIAGNOSIS — M47817 Spondylosis without myelopathy or radiculopathy, lumbosacral region: Secondary | ICD-10-CM | POA: Diagnosis present

## 2010-07-06 DIAGNOSIS — I10 Essential (primary) hypertension: Secondary | ICD-10-CM | POA: Diagnosis present

## 2010-07-06 DIAGNOSIS — I359 Nonrheumatic aortic valve disorder, unspecified: Secondary | ICD-10-CM

## 2010-07-06 DIAGNOSIS — R209 Unspecified disturbances of skin sensation: Secondary | ICD-10-CM | POA: Diagnosis present

## 2010-07-06 DIAGNOSIS — J189 Pneumonia, unspecified organism: Secondary | ICD-10-CM | POA: Diagnosis present

## 2010-07-06 DIAGNOSIS — R946 Abnormal results of thyroid function studies: Secondary | ICD-10-CM | POA: Diagnosis present

## 2010-07-06 DIAGNOSIS — E785 Hyperlipidemia, unspecified: Secondary | ICD-10-CM | POA: Diagnosis present

## 2010-07-06 DIAGNOSIS — K219 Gastro-esophageal reflux disease without esophagitis: Secondary | ICD-10-CM | POA: Diagnosis present

## 2010-07-06 DIAGNOSIS — K5730 Diverticulosis of large intestine without perforation or abscess without bleeding: Secondary | ICD-10-CM | POA: Diagnosis present

## 2010-07-06 DIAGNOSIS — M79609 Pain in unspecified limb: Secondary | ICD-10-CM | POA: Diagnosis present

## 2010-07-06 DIAGNOSIS — Z79899 Other long term (current) drug therapy: Secondary | ICD-10-CM

## 2010-07-06 DIAGNOSIS — Z954 Presence of other heart-valve replacement: Secondary | ICD-10-CM

## 2010-07-06 DIAGNOSIS — M899 Disorder of bone, unspecified: Secondary | ICD-10-CM | POA: Diagnosis present

## 2010-07-06 LAB — DIFFERENTIAL
Basophils Absolute: 0 10*3/uL (ref 0.0–0.1)
Basophils Relative: 0 % (ref 0–1)
Eosinophils Absolute: 0.2 10*3/uL (ref 0.0–0.7)
Eosinophils Relative: 3 % (ref 0–5)
Lymphocytes Relative: 16 % (ref 12–46)
Lymphs Abs: 1 10*3/uL (ref 0.7–4.0)
Monocytes Absolute: 0.3 10*3/uL (ref 0.1–1.0)
Monocytes Absolute: 0.3 10*3/uL (ref 0.1–1.0)
Monocytes Relative: 5 % (ref 3–12)
Neutro Abs: 3.8 10*3/uL (ref 1.7–7.7)
Neutro Abs: 5 10*3/uL (ref 1.7–7.7)

## 2010-07-06 LAB — URINALYSIS, ROUTINE W REFLEX MICROSCOPIC
Bilirubin Urine: NEGATIVE
Nitrite: NEGATIVE
Protein, ur: NEGATIVE mg/dL
Specific Gravity, Urine: 1.008 (ref 1.005–1.030)
Urobilinogen, UA: 0.2 mg/dL (ref 0.0–1.0)

## 2010-07-06 LAB — COMPREHENSIVE METABOLIC PANEL
AST: 20 U/L (ref 0–37)
Albumin: 3.4 g/dL — ABNORMAL LOW (ref 3.5–5.2)
Alkaline Phosphatase: 92 U/L (ref 39–117)
BUN: 12 mg/dL (ref 6–23)
CO2: 27 mEq/L (ref 19–32)
Chloride: 101 mEq/L (ref 96–112)
Creatinine, Ser: 0.9 mg/dL (ref 0.50–1.10)
GFR calc non Af Amer: >60 mL/min (ref >60)
Potassium: 3 mEq/L — ABNORMAL LOW (ref 3.5–5.1)
Total Bilirubin: 0.4 mg/dL (ref 0.3–1.2)

## 2010-07-06 LAB — CARDIAC PANEL(CRET KIN+CKTOT+MB+TROPI)
CK, MB: 1.4 ng/mL (ref 0.3–4.0)
Total CK: 38 U/L (ref 7–177)

## 2010-07-06 LAB — CBC
HCT: 32.9 % — ABNORMAL LOW (ref 36.0–46.0)
Hemoglobin: 10.2 g/dL — ABNORMAL LOW (ref 12.0–15.0)
Hemoglobin: 10.5 g/dL — ABNORMAL LOW (ref 12.0–15.0)
MCHC: 31.9 g/dL (ref 30.0–36.0)
MCHC: 31.9 g/dL (ref 30.0–36.0)
MCV: 92.2 fL (ref 78.0–100.0)
Platelets: 206 10*3/uL (ref 150–400)
RDW: 15.5 % (ref 11.5–15.5)
RDW: 15.3 % (ref 11.5–15.5)

## 2010-07-06 LAB — PROTIME-INR
INR: 1.1 (ref 0.00–1.49)
INR: 1.14 (ref 0.00–1.49)
Prothrombin Time: 14.4 seconds (ref 11.6–15.2)

## 2010-07-06 LAB — PHOSPHORUS: Phosphorus: 3.1 mg/dL (ref 2.3–4.6)

## 2010-07-06 LAB — BASIC METABOLIC PANEL
Calcium: 9.6 mg/dL (ref 8.4–10.5)
GFR calc Af Amer: >60 mL/min (ref >60)
GFR calc non Af Amer: 60 mL/min — ABNORMAL LOW (ref >60)
Glucose, Bld: 96 mg/dL (ref 70–99)
Potassium: 3.8 mEq/L (ref 3.5–5.1)
Sodium: 140 mEq/L (ref 135–145)

## 2010-07-06 LAB — MAGNESIUM: Magnesium: 1.8 mg/dL (ref 1.5–2.5)

## 2010-07-06 LAB — PRO B NATRIURETIC PEPTIDE: Pro B Natriuretic peptide (BNP): 4909 pg/mL — ABNORMAL HIGH (ref 0–125)

## 2010-07-07 ENCOUNTER — Inpatient Hospital Stay (HOSPITAL_COMMUNITY): Payer: Medicare Other

## 2010-07-07 ENCOUNTER — Ambulatory Visit (HOSPITAL_COMMUNITY): Payer: Medicare Other

## 2010-07-07 LAB — COMPREHENSIVE METABOLIC PANEL
ALT: 21 U/L (ref 0–35)
Albumin: 3.2 g/dL — ABNORMAL LOW (ref 3.5–5.2)
Alkaline Phosphatase: 89 U/L (ref 39–117)
BUN: 11 mg/dL (ref 6–23)
Calcium: 8.9 mg/dL (ref 8.4–10.5)
GFR calc Af Amer: >60 mL/min (ref >60)
Glucose, Bld: 111 mg/dL — ABNORMAL HIGH (ref 70–99)
Potassium: 3.5 mEq/L (ref 3.5–5.1)
Sodium: 141 mEq/L (ref 135–145)
Total Protein: 7.2 g/dL (ref 6.0–8.3)

## 2010-07-07 LAB — DIFFERENTIAL
Basophils Absolute: 0 10*3/uL (ref 0.0–0.1)
Basophils Relative: 0 % (ref 0–1)
Eosinophils Absolute: 0.1 10*3/uL (ref 0.0–0.7)
Neutro Abs: 6.1 10*3/uL (ref 1.7–7.7)
Neutrophils Relative %: 84 % — ABNORMAL HIGH (ref 43–77)

## 2010-07-07 LAB — TSH: TSH: 8.194 u[IU]/mL — ABNORMAL HIGH (ref 0.350–4.500)

## 2010-07-07 LAB — CBC
HCT: 31.6 % — ABNORMAL LOW (ref 36.0–46.0)
MCV: 91.9 fL (ref 78.0–100.0)
RBC: 3.44 MIL/uL — ABNORMAL LOW (ref 3.87–5.11)
WBC: 7.3 10*3/uL (ref 4.0–10.5)

## 2010-07-07 LAB — MAGNESIUM: Magnesium: 1.8 mg/dL (ref 1.5–2.5)

## 2010-07-07 LAB — CARDIAC PANEL(CRET KIN+CKTOT+MB+TROPI)
CK, MB: 1.3 ng/mL (ref 0.3–4.0)
Total CK: 32 U/L (ref 7–177)
Troponin I: <0.3 ng/mL (ref <0.30)

## 2010-07-07 LAB — LIPID PANEL
Cholesterol: 218 mg/dL — ABNORMAL HIGH (ref 0–200)
HDL: 51 mg/dL (ref >39)
LDL Cholesterol: 139 mg/dL — ABNORMAL HIGH (ref 0–99)
Triglycerides: 138 mg/dL (ref <150)

## 2010-07-08 LAB — URINE CULTURE
Colony Count: >=100000
Culture  Setup Time: 201206262106

## 2010-07-08 LAB — CBC
MCHC: 31.6 g/dL (ref 30.0–36.0)
Platelets: 207 10*3/uL (ref 150–400)
RDW: 15.3 % (ref 11.5–15.5)
WBC: 4.9 10*3/uL (ref 4.0–10.5)

## 2010-07-08 LAB — MAGNESIUM: Magnesium: 1.9 mg/dL (ref 1.5–2.5)

## 2010-07-08 LAB — PROTIME-INR
INR: 1.5 — ABNORMAL HIGH (ref 0.00–1.49)
Prothrombin Time: 18.4 seconds — ABNORMAL HIGH (ref 11.6–15.2)

## 2010-07-08 LAB — BASIC METABOLIC PANEL
CO2: 31 mEq/L (ref 19–32)
Calcium: 8.8 mg/dL (ref 8.4–10.5)
Creatinine, Ser: 1 mg/dL (ref 0.50–1.10)
GFR calc Af Amer: >60 mL/min (ref >60)
GFR calc non Af Amer: 55 mL/min — ABNORMAL LOW (ref >60)
Sodium: 141 mEq/L (ref 135–145)

## 2010-07-08 LAB — T4, FREE: Free T4: 1.27 ng/dL (ref 0.80–1.80)

## 2010-07-09 ENCOUNTER — Inpatient Hospital Stay (HOSPITAL_COMMUNITY): Payer: Medicare Other

## 2010-07-09 ENCOUNTER — Ambulatory Visit (HOSPITAL_COMMUNITY): Payer: Medicare Other

## 2010-07-09 LAB — PROTIME-INR
INR: 1.52 — ABNORMAL HIGH (ref 0.00–1.49)
Prothrombin Time: 18.6 seconds — ABNORMAL HIGH (ref 11.6–15.2)

## 2010-07-09 LAB — SEDIMENTATION RATE: Sed Rate: 51 mm/hr — ABNORMAL HIGH (ref 0–22)

## 2010-07-09 LAB — CBC
Hemoglobin: 10.2 g/dL — ABNORMAL LOW (ref 12.0–15.0)
MCH: 29.4 pg (ref 26.0–34.0)
MCHC: 31.6 g/dL (ref 30.0–36.0)
RDW: 15.6 % — ABNORMAL HIGH (ref 11.5–15.5)

## 2010-07-09 LAB — BASIC METABOLIC PANEL
BUN: 17 mg/dL (ref 6–23)
Creatinine, Ser: 1.49 mg/dL — ABNORMAL HIGH (ref 0.50–1.10)
GFR calc non Af Amer: 35 mL/min — ABNORMAL LOW (ref >60)
Glucose, Bld: 89 mg/dL (ref 70–99)
Potassium: 3.9 mEq/L (ref 3.5–5.1)

## 2010-07-09 LAB — MAGNESIUM: Magnesium: 1.9 mg/dL (ref 1.5–2.5)

## 2010-07-10 ENCOUNTER — Inpatient Hospital Stay (HOSPITAL_COMMUNITY): Payer: Medicare Other

## 2010-07-10 DIAGNOSIS — I5033 Acute on chronic diastolic (congestive) heart failure: Secondary | ICD-10-CM

## 2010-07-10 LAB — CBC
MCH: 29.4 pg (ref 26.0–34.0)
MCHC: 31.5 g/dL (ref 30.0–36.0)
Platelets: 215 10*3/uL (ref 150–400)
RBC: 3.47 MIL/uL — ABNORMAL LOW (ref 3.87–5.11)
RDW: 15.4 % (ref 11.5–15.5)

## 2010-07-10 LAB — BASIC METABOLIC PANEL
Calcium: 8.7 mg/dL (ref 8.4–10.5)
Creatinine, Ser: 1.19 mg/dL — ABNORMAL HIGH (ref 0.50–1.10)
GFR calc non Af Amer: 45 mL/min — ABNORMAL LOW (ref >60)
Glucose, Bld: 94 mg/dL (ref 70–99)
Sodium: 139 mEq/L (ref 135–145)

## 2010-07-11 LAB — CBC
HCT: 29.9 % — ABNORMAL LOW (ref 36.0–46.0)
MCH: 29.8 pg (ref 26.0–34.0)
MCHC: 32.4 g/dL (ref 30.0–36.0)
MCV: 91.7 fL (ref 78.0–100.0)
Platelets: 208 10*3/uL (ref 150–400)
RDW: 15.1 % (ref 11.5–15.5)
WBC: 5.7 10*3/uL (ref 4.0–10.5)

## 2010-07-11 LAB — BASIC METABOLIC PANEL
BUN: 15 mg/dL (ref 6–23)
CO2: 32 mEq/L (ref 19–32)
Calcium: 9.1 mg/dL (ref 8.4–10.5)
Creatinine, Ser: 1.2 mg/dL — ABNORMAL HIGH (ref 0.50–1.10)
GFR calc non Af Amer: 44 mL/min — ABNORMAL LOW (ref >60)
Glucose, Bld: 96 mg/dL (ref 70–99)

## 2010-07-12 ENCOUNTER — Ambulatory Visit: Payer: Self-pay | Admitting: Cardiothoracic Surgery

## 2010-07-12 ENCOUNTER — Ambulatory Visit (HOSPITAL_COMMUNITY): Payer: Medicare Other

## 2010-07-12 LAB — BASIC METABOLIC PANEL
BUN: 13 mg/dL (ref 6–23)
Creatinine, Ser: 1.15 mg/dL — ABNORMAL HIGH (ref 0.50–1.10)
GFR calc Af Amer: 56 mL/min — ABNORMAL LOW (ref >60)
GFR calc non Af Amer: 47 mL/min — ABNORMAL LOW (ref >60)
Glucose, Bld: 98 mg/dL (ref 70–99)
Potassium: 3.5 mEq/L (ref 3.5–5.1)

## 2010-07-12 LAB — CBC
HCT: 33.3 % — ABNORMAL LOW (ref 36.0–46.0)
Hemoglobin: 10.5 g/dL — ABNORMAL LOW (ref 12.0–15.0)
MCHC: 31.5 g/dL (ref 30.0–36.0)
MCV: 91.5 fL (ref 78.0–100.0)
RDW: 15.2 % (ref 11.5–15.5)

## 2010-07-12 LAB — PROTIME-INR: INR: 1.82 — ABNORMAL HIGH (ref 0.00–1.49)

## 2010-07-12 NOTE — Discharge Summary (Signed)
NAMESHEQUILLA, GOODGAME NO.:  [id number]  MEDICAL RECORD NO.:  [id number]  LOCATION:  1513                         FACILITY:  [facility]  PHYSICIAN:  Andreas Blower, MD       DATE OF BIRTH:  March 30, 1940  DATE OF ADMISSION:  07/06/2010 DATE OF DISCHARGE:                              DISCHARGE SUMMARY   PRIMARY CARE PHYSICIAN:  Sigmund Hazel, M.D.  PRIMARY CARDIOLOGIST:  Armanda Magic, M.D.  DISCHARGE DIAGNOSES: 1. Shortness of breath, multifactorial. 2. Acute-on-chronic diastolic heart failure. 3. Community-acquired pneumonia. 4. Escherichia coli urinary tract infection. 5. Hypertension. 6. History of paroxysmal atrial fibrillation. 7. Gastroesophageal reflux disease. 8. History of aortic valve and mitral valve replacement. 9. Elevated thyroid stimulating hormone with normal free T4. 10.History of chronic insomnia. 11.Hyperlipidemia. 12.Osteopenia. 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 14.History of diverticulosis. 15.History of umbilical hernia. 16.History of cholecystectomy. 17.History of neck surgery. 18.History of knee surgery.  DISCHARGE MEDICATIONS: 1. Lovenox 70 mg subcu twice daily for 3 days with one refill to be     discontinued after INR is greater than 2.0. 2. Levofloxacin 250 mg p.o. daily at bedtime for 2 days. 3. Losartan 50 mg p.o. daily. 4. Phenergan 12.5 mg p.o. q.6h. as needed for nausea. 5. Amiodarone 20 mg p.o. daily. 6. Furosemide 40 mg p.o. twice daily. 7. Coumadin 4 mg p.o. q.p.m. 8. Aspirin 81 mg p.o. daily. 9. Calcium carbonate 1200 mg p.o. daily. 10.Clonidine patch 0.2 mg 1 patch transdermally weekly. 11.Carvedilol 25 mg p.o. twice daily with meals. 12.Flonase 2 sprays daily as needed for stuffy nose. 13.Neurontin 300 mg p.o. twice daily. 14.Pantoprazole 40 mg p.o. daily. 15.Potassium chloride 20 mEq p.o. daily. 16.Acetaminophen 650 mg p.o. daily as needed for pain.  BRIEF ADMITTING HISTORY AND PHYSICAL:  Ms. Jagielski is a  70 year old Caucasian female with a recent history of mitral valve replacement and aortic valve replacement and maze procedure done by Dr. Donata Clay with delayed sternal closure, who presented on July 06, 2010 with complaints of shortness of breath and weakness.  RADIOLOGY AND IMAGING: 1. The patient had chest x-ray on July 06, 2010 which showed     enlargement of cardiac silhouette which is slightly larger than     previously.  Central vascular congestion pattern.  Patchy     infiltrates and/or edema.  Opacities are seen involving the     perihilar region and the mid and lower areas of lung.  There is     increased density in the inferior lung base, is seen with loss of     definition of left hemidiaphragm consistent with atelectasis,     infiltrate and/or left pleural effusion.  The patient had a most     recent chest x-ray two-view on July 10, 2010 which showed improving     edema pattern with residual atelectasis and pleural effusion. 2. The patient had a 2-D echocardiogram on July 07, 2010 which showed     left ventricle cavity size was normal.  Systolic function was     normal.  Ejection fraction was 60 % to 65%.  Wall motion was normal     with no regional wall motion  abnormalities.  Ventricular septum     contour showed diastolic flattening.  Aortic valve showed     bioprosthesis was present.  Mitral valve showed findings consistent     with mild stenosis.  Right atrium was mildly dilated.  Pulmonary     artery systolic was moderately increased with PA peak pressure of     48 mmHg.  Pericardium, there was left pleural effusion.  LABORATORY DATA:  CBC shows white count of 6.2, hemoglobin 10.5, hematocrit 33.3, platelet count 228,000, INR 1.82.  Electrolytes normal, except BUN is 13, creatinine 1.15.  Liver function tests normal, except albumin is 3.2.  Initial BNP was 923.  LDL is 139.  TSH is 8.194, free T4 is 127.  UA was negative for nitrites and leukocytes.  Urine  culture grew E. coli that was sensitive to all antibiotics, except ampicillin.  HOSPITAL COURSE BY PROBLEM: 1. Shortness of breath, likely multifactorial, improved during the     course of hospital stay. Likely due to acute on chronic     diastolick heart failure, questionable pneumonia, and pleural     effusions. 2. Acute-on-chronic diastolic heart failure.  The patient had 2-D     echocardiogram with the results as indicated above.  The patient     initially was started on IV Lasix twice daily which was     transitioned to furosemide 40 mg p.o. twice daily.  The patient's     breathing has improved after the patient was adequately diuresed.     The patient will need to follow with Dr. Mayford Knife to have her     electrolytes checked in a week.  Further titration of Lasix to be     done as an outpatient. 3. Questionable pneumonia.  The patient had questionable community-     acquired pneumonia as a result, initially the patient was started     on broad-spectrum antibiotics with vancomycin and levofloxacin     during the course of the hospital stay.  Her antibiotics were     transitioned to levofloxacin which she will continue for 2 more     days to complete a 7-day course of antibiotics. 4. E. coli urinary tract infection.  The patient was on levofloxacin     which she will continue for 2 more days to complete a 7-day course     of antibiotics. 5. Hypertension.  During the course of hospital stay, the patient was     having chronic cough.  As a result, her lisinopril was     discontinued.  The patient was start started on losartan.  Further     titration of antihypertensive medications to be done as an     outpatient.  Uncertain if the lisinopril is causing her cough.  The     patient may have underlying cough due to pleural effusion and acute-     on-chronic diastolic heart failure. 6. Hypoxia, multifactorial, likely due to pleural effusions and acute-     on-chronic diastolic heart  failure. 7. Pleural effusion, likely due to acute-on-chronic diastolic heart     failure, improved during the course of hospital stay. 8. History of paroxysmal AFIB.  The patient was evaluated by     cardiology and during the course of the hospital stay, her     amiodarone dose was decreased from 200 mg p.o. twice daily to 200     mg daily. 9. The patient was also continued on Coumadin for anticoagulation. 10.GERD.  Continue the patient on PPI. 11.Aortic valve replacement and mitral valve replacement.  The patient     is adequately anticoagulated.  At discharge, her INR is 1.8, so the     patient will be discharged home on Lovenox for bridging until her     INR is therapeutic after which her Lovenox can be discontinued. 12.Elevated TSH with normal free T4.  Further management as an     outpatient.  Uncertain if her amiodarone is causing elevated TSH. 13.Hyperlipidemia with LDL of 139.  For hyperlipidemia, will start the     patient on low-dose simvastatin at 10 mg p.o. q.h.s.  DISPOSITION AND FOLLOWUP:  The patient to follow up with Dr. Mayford Knife, cardiology, in 1 week.  Dr. Mayford Knife to check the patient's electrolytes and PT/INR.  The patient to follow with Dr. Hyacinth Meeker, her primary care physician, in 1 week as needed.  The patient to have her PT/INR checked by home health on July 13, 2010 and have the results sent to her cardiologists.  Time spent on discharge, talking to the patient, coordinating care was 40 minutes.   Andreas Blower, MD   SR/MEDQ  D:  07/12/2010  T:  07/12/2010  Job:  [id number]  Electronically Signed by Wardell Heath Tino Ronan  on 07/12/2010 07:55:39 PM

## 2010-07-14 ENCOUNTER — Ambulatory Visit (HOSPITAL_COMMUNITY): Payer: Medicare Other

## 2010-07-14 NOTE — H&P (Signed)
NAME:  Ashley [name], Ashley [name] NO.:  [id number]  MEDICAL RECORD NO.:  [id number]  LOCATION:  WLED                         FACILITY:  [facility]  PHYSICIAN:  Standley Dakins, MD   DATE OF BIRTH:  Apr 14, 1940  DATE OF ADMISSION:  07/06/2010 DATE OF DISCHARGE:                             HISTORY & PHYSICAL   PRIMARY CARE PHYSICIAN:  Sigmund Hazel, MD  CARDIOLOGIST:  Armanda Magic, MD  CHIEF COMPLAINT:  Shortness of breath and weakness.  Historian:  The patient and emergency room physician.  HISTORY OF PRESENT ILLNESS:  This patient is a 70 year old female who is recently status post having a mitral valve repair and aortic valve replacement with thoracentesis treatment on June 21, 2010.  She also had a maze procedure done for atrial fibrillation with direct current cardioversion.  She is currently in sinus rhythm.  She is on Coumadin at this time.  She has a history of congestive heart failure as well.  She reports that she took 2 Ambien tablets last night and had difficulty waking up this morning.  She has been confused and tired and not her normal self.  She reports that she has been having progressive shortness of breath over the last week.  She reports shortness of breath with walking short distances.  She reports excessive fatigue that she believed was related to recovery from the surgery.  She reports that she also has had a persistent cough that has been nonproductive, but no fever or chills.  She denies chest pain and palpitations.  In the emergency department today, the patient initially was hypoxic with a pulse ox of 87% on room air.  She was given supplemental oxygen and her pulse ox improved.  She was found to be clinically volume overloaded, and her chest x-ray was suspicious for possible pneumonia and also fluid overload.  Hospital admission was requested for further treatment and evaluation.  PAST MEDICAL HISTORY: 1. History of paroxysmal atrial fibrillation.   She had an initial     cardioversion in 2009, repeated in 2011, and then had an atrial     fibrillation ablation procedure done in November 2011, with a     repeat cardioversion done in January 2012.  She also had the recent     cardioversion and maze procedure done in June 2012. 2. Hypertension. 3. Dyslipidemia. 4. Hiatal hernia and umbilical hernia. 5. Diverticulitis history. 6. GERD. 7. Osteopenia. 8. Degenerative joint disease of the lumbar spine, status post several     epidural steroid injections. 9. Congestive heart failure. 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  PAST SURGICAL HISTORY: 1. Status post recent mitral valve repair and aortic valve replacement     June 2012. 2. Umbilical hernia repair x2. 3. Cholecystectomy. 4. Bilateral shoulder repair. 5. Hysterectomy. 6. C5-C6 fusion. 7. Left knee surgery x2. 8. RF catheter ablation of atrial fibrillation in November 2011 as     mentioned above.  CURRENT MEDICATIONS: 1. Carvedilol 25 mg 1-1/2 tablets p.o. twice daily. 2. Lisinopril 20 mg 1 tablet p.o.  twice daily. 3. Catapres-TTS-2 0.2 mg patch transdermal weekly. 4. Coumadin 2.5 mg daily except 5 mg Monday, Wednesday, Friday. 5. Amiodarone 200 mg 1 tablet orally once daily. 6. Furosemide 20 mg p.o. daily. 7. Zolpidem 10 mg 1 tablet at bedtime p.r.n. insomnia. 8. Tramadol 50 mg 1 tablet p.o. every 6 hours p.r.n. 9. Klor-Con 20 mEq 1 tablet orally once daily. 10.Glucosamine liquid daily. 11.Protonix 40 mg 1 tablet orally once daily. 12.Tylenol 325 mg 1 tablet as needed orally every 6 hours. 13.Enteric-coated low-dose aspirin 81 mg 1 tablet p.o. daily. 14.Calcium carbonate 1200 mg/vitamin D 1000 International Units 1     tablet with meal orally once daily. 15.The patient reports that she initially had stopped Coumadin after     her June  procedure but was then restarted on Coumadin according to     recommendations from her cardiologist.  ALLERGIES:  CODEINE, which causes nausea and vomiting.  DARVOCET causes stomach upset, and possible IODINE reaction in the past.  SOCIAL HISTORY:  This patient is a former smoker but she quit over 25 years' ago.  She denies current alcohol use.  She is retired and works part-time at [employer] in [place].  The patient is divorced.  FAMILY HISTORY:  Significant for hypertension and heart disease.  REVIEW OF SYSTEMS:  Please see positives mentioned in the HPI.  Also there is an additional reporting of edema in the right lower extremity, the patient has noticed over the past 2 weeks.  No changes in weight that have been significant.  Please see HPI, otherwise all systems reviewed completely and reported as negative.  PHYSICAL EXAMINATION:  CURRENT VITAL SIGNS:  Temperature 98.3, pulse 70, respirations 16, blood pressure 169/98, pulse ox 98% on 2 L nasal cannula.  GENERAL:  This is a well-developed, well-nourished female. She is lying in the bed.  She is in no apparent distress. HEENT:  Normocephalic, atraumatic.  Mucous membranes dry.  Eyes:  Normal appearance, no scleral icterus.  ENT:  Nares clear.  NECK:  Supple. Full range of motion.  No JVD.  Thyroid:  No nodules or masses palpated. CARDIOVASCULAR:  Normal S1, S2 sounds with a large click murmur from the valve replacement. LUNGS:  Bilateral breath sounds with crackles heard at both bases. ABDOMEN:  Soft with a noticeable umbilical hernia.  Abdomen nondistended, nontender.  No masses palpated.  No hepatosplenomegaly, guarding or rebound tenderness noted. EXTREMITIES:  The patient has 1+ nonpitting pretibial edema bilateral lower extremities, right greater than left. NEUROLOGIC:  Awake, alert and oriented x3, moving all extremities.  The patient does have sensation of severe pain in the right upper extremity including the hands  and forearms with palpation consistent with hyperesthesia. SKIN:  Warm.  No gross lesions noted.  LABORATORY DATA:  CK total 36, troponin less than 0.30, BNP 4909.  PT 14.4, INR 1.10.  Sodium 140, potassium 3.8, chloride 103, bicarb 28, BUN 11, glucose 96, creatinine 0.93, estimated GFR 60.  White blood cell count 5.3, hemoglobin 10.2, hematocrit 32.0, platelet count 206. Urinalysis essentially within normal limits.  IMAGING STUDIES:  Chest x-ray, PA and lateral, reports enlargement of the cardiac silhouette, central vascular congestion, patchy infiltrates and/or edema, opacities in the perihilar regions and mid and lower areas of the lung; cannot exclude an element of pneumonia.  Increased density in the inferior lung base with loss of definition of the left hemidiaphragmatic margin consistent with atelectasis, infiltrate and/or pleural effusion on the left.  IMPRESSION: 1. This is  a 70 year old female recently postop from a significant     aortic valve replacement and mitral valve repair, now with     congestive heart failure with acute exacerbation. 2. Possible pneumonia. 3. Hypertension. 4. Hypoxia. 5. Anemia. 6. Paroxysmal atrial fibrillation, currently in sinus rhythm. 7. Hyperesthesia of the right upper extremity. 8. Gastroesophageal reflux disease. 9. Chronic insomnia. 10.Dyslipidemia. 11.Osteopenia. 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 13.Diverticulosis. 14.Umbilical hernia.  PLAN/COMMENTS AND RECOMMENDATIONS: 1. The patient is going to be admitted to telemetry.  The patient will     be diuresed with IV furosemide. 2. In's and out's will be monitored closely. 3. The patient will be resumed on her home medications for     hypertension and congestive heart failure. 4. Cycle cardiac enzymes. 5. Monitor electrolytes closely. 6. Continue Protonix for GERD. 7. The patient will be empirically started on antibiotics for hospital-     acquired pneumonia given her  recent hospitalization and surgery. 8. Repeat chest x-ray after diuresis. 9. Physical therapy consultation recommended. 10.Will continue to monitor the patient closely and make adjustments     to medical care as required.  I also recommend consulting her     cardiology team to evaluate her while she is in the hospital. 11.Continue Coumadin per Pharmacy; she is subtherapeutic at this time     but she does say that she has been taking it. 12.See orders and medical records for full details of the hospital     admission.     Standley Dakins, MD     CJ/MEDQ  D:  07/06/2010  T:  07/06/2010  Job:  [id number]  cc:   Sigmund Hazel, M.D. Fax: [phone]  Armanda Magic, M.D. Fax: [phone]  Electronically Signed by Standley Dakins  on 07/14/2010 02:56:36 PM

## 2010-07-16 ENCOUNTER — Ambulatory Visit (HOSPITAL_COMMUNITY): Payer: Medicare Other

## 2010-07-19 ENCOUNTER — Ambulatory Visit (HOSPITAL_COMMUNITY): Payer: Medicare Other

## 2010-07-21 ENCOUNTER — Ambulatory Visit (HOSPITAL_COMMUNITY): Payer: Medicare Other

## 2010-07-23 ENCOUNTER — Ambulatory Visit (HOSPITAL_COMMUNITY): Payer: Medicare Other

## 2010-07-26 ENCOUNTER — Ambulatory Visit (HOSPITAL_COMMUNITY): Payer: Medicare Other

## 2010-07-28 ENCOUNTER — Ambulatory Visit (HOSPITAL_COMMUNITY): Payer: Medicare Other

## 2010-07-30 ENCOUNTER — Ambulatory Visit (HOSPITAL_COMMUNITY): Payer: Medicare Other

## 2010-08-02 ENCOUNTER — Ambulatory Visit (HOSPITAL_COMMUNITY): Payer: Medicare Other

## 2010-08-04 ENCOUNTER — Ambulatory Visit (HOSPITAL_COMMUNITY): Payer: Medicare Other

## 2010-08-06 ENCOUNTER — Ambulatory Visit (HOSPITAL_COMMUNITY): Payer: Medicare Other

## 2010-08-07 ENCOUNTER — Other Ambulatory Visit: Payer: Self-pay | Admitting: Internal Medicine

## 2010-08-09 ENCOUNTER — Ambulatory Visit (HOSPITAL_COMMUNITY): Payer: Medicare Other

## 2010-08-11 ENCOUNTER — Ambulatory Visit (HOSPITAL_COMMUNITY): Payer: Medicare Other

## 2010-08-11 NOTE — Consult Note (Signed)
NAMEJOELLY, BOLANOS NO.:  [id number]  MEDICAL RECORD NO.:  [id number]  LOCATION:  1513                         FACILITY:  [facility]  PHYSICIAN:  Corky Crafts, MDDATE OF BIRTH:  09/29/1940  DATE OF CONSULTATION:  07/07/2010 DATE OF DISCHARGE:                                CONSULTATION   PRIMARY CARDIOLOGIST:  Armanda Magic, M.D.  PRIMARY CARE PHYSICIAN:  Sigmund Hazel, M.D.  REFERRING PHYSICIAN:  Andreas Blower, MD.  REASON FOR CONSULTATION:  Shortness of breath, possible congestive heart failure.  HISTORY OF PRESENT ILLNESS:  The patient is a 70 year old woman who in April had a mitral valve repair, aortic valve repair and maze procedure done by Dr. Donata Clay.  She had delayed sternal closure.  Several days later she developed a left-sided pleural effusion and had thoracentesis on June 21, 2010.  She has also had a cardioversion since her surgery. She is maintained on Coumadin.  She was somewhat confused yesterday after taking a couple of Ambien trying to sleep.  She reported that she was getting short of breath over the past week as well.  Initially she was found to have oxygen saturation of 87% on room air.  She has been maintained on oxygen.  She has been given IV Lasix as well.  There was also a question of hospital-acquired pneumonia.  Her breathing status has improved.  She never really lays completely flat but currently has a bed at about a 30-degree angle.  She did not lay flat even before her heart issues started.  She does not report any chest pain.  Her shortness of breath is better.  She feels like she is urinating a lot while on Lasix.  PAST MEDICAL HISTORY: 1. Atrial fibrillation. 2. Mitral valve and aortic valve disease. 3. Hypertension. 4. Hyperlipidemia. 5. Umbilical hernia. 6. Arthritis. 7. GERD.  PAST SURGICAL HISTORY: 1. Recent cardiac surgery including mitral valve repair, aortic valve     replacement. 2. Hernia  surgery. 3. Shoulder surgery. 4. Cholecystectomy. 5. Neck surgery. 6. Knee surgery.  MEDICATIONS IN THE HOSPITAL: 1. Amiodarone 200 mg a day. 2. Aspirin 81 mg a day. 3. Carvedilol 37.5 b.i.d. 4. Clonidine 0.2 mg patch once a week. 5. Lovenox. 6. Lasix 40 mg IV q.12. 7. Levaquin, 8. Prinivil 20 mg b.i.d. 9. Protonix 40 mg daily. 10.Zosyn. 11.Potassium. 12.Coumadin.  ALLERGIES:  She is intolerant of DARVOCET.  SOCIAL HISTORY:  She quit smoking many years ago.  FAMILY HISTORY:  Significant for both heart disease and hypertension.  REVIEW OF SYSTEMS:  Significant for shortness of breath and some mild confusion.  She had some mild lower extremity swelling and dyspnea on exertion.  All other systems negative.  PHYSICAL EXAMINATION:  VITAL SIGNS:  Blood pressure 130/81.  She is 95% on 2 L.  Heart rate 72. GENERAL:  She is awake, alert, no apparent distress. HEAD:  Normocephalic, atraumatic. EYES:  Extraocular movements intact. NECK:  No JVD. CARDIOVASCULAR:  Regular rate and rhythm.  Normal S1, S2. LUNGS:  Decreased breath sounds in the left base, crackles at the right base. ABDOMEN:  Nondistended. EXTREMITIES:  Showed no edema. NEURO:  No focal motor or sensory  deficits. PSYCH:  Normal mood and affect.  LABORATORY DATA:  Lab work shows troponin negative.  LDL 139.  BNP 4939, creatinine 0.9.  INR 1.14 yesterday.  Chest x-ray showed improved edema, could not rule out pneumonia.  ASSESSMENT/PLAN:  A 70 year old with recent cardiac surgery: 1. Cardiac echo is pending.  This is likely acute on chronic systolic     heart failure.  Her ejection fraction was slightly decreased in the     past.  Continue IV Lasix.  Follow creatinine.  Her breathing status     is improved with diuresis. 2. She had aortic valve replacement, mitral valve repair.  Coumadin     likely for a few months after the aortic valve     repair and also given a history of atrial fibrillation. 3. Atrial  fibrillation.  She is currently in sinus rhythm.  Continue     amiodarone.  Watch INR carefully given antibiotic use.     Corky Crafts, MD     JSV/MEDQ  D:  07/07/2010  T:  07/07/2010  Job:  [id number]  Electronically Signed by Lance Muss MD on 08/11/2010 01:09:54 PM

## 2010-08-13 ENCOUNTER — Ambulatory Visit (HOSPITAL_COMMUNITY): Payer: Medicare Other

## 2010-08-16 ENCOUNTER — Ambulatory Visit (HOSPITAL_COMMUNITY): Payer: Medicare Other

## 2010-08-18 ENCOUNTER — Ambulatory Visit (HOSPITAL_COMMUNITY): Payer: Medicare Other

## 2010-08-18 ENCOUNTER — Ambulatory Visit (INDEPENDENT_AMBULATORY_CARE_PROVIDER_SITE_OTHER): Payer: Medicare Other | Admitting: Cardiothoracic Surgery

## 2010-08-18 DIAGNOSIS — I359 Nonrheumatic aortic valve disorder, unspecified: Secondary | ICD-10-CM

## 2010-08-18 DIAGNOSIS — I4891 Unspecified atrial fibrillation: Secondary | ICD-10-CM

## 2010-08-18 DIAGNOSIS — I059 Rheumatic mitral valve disease, unspecified: Secondary | ICD-10-CM

## 2010-08-19 NOTE — Assessment & Plan Note (Signed)
OFFICE VISIT  Ashley [name], Ashley [name] DOB:  1940/08/14                                        August 18, 2010 CHART #:  [id number]  CURRENT PROBLEMS: 1. Status post mitral valve repair, maze procedure and aortic valve     replacement with a tissue valve on April 29, 2010. 2. Postoperative atrial fibrillation requiring cardioversion x1 now     with subsequent permanent sinus rhythm. 3. Postoperative left pleural effusion, resolved following     thoracentesis. 4. Recent admission for UTI - pneumonia.  PRESENT ILLNESS:  The patient is a 70 year old female who returns to the office visit for postsurgical followup.  She is currently on home oxygen after her hospitalization in early July.  She is on 2 liters nasal cannula.  She is still somewhat weak.  She denies orthopnea or PND, but she does have dyspnea on exertion.  Had an echo done while she was hospitalized in late June showed good LV function and no mitral regurgitation.  No aortic valve insufficiency.  The Doppler gradient across the aortic valve was somewhat elevated but probably related to her postoperative catecholamine state and anemia.  She has not had a chest x-ray in the system for 6 weeks.  She currently is on multiple medications including amiodarone 200 mg a day, Coumadin 5 mg a day and for blood pressure, she is on Coreg 12.5 b.i.d., Catapres 0.2 patch weekly, and losartan 1 tablet a day.  She is also on Lasix 40 mg daily, potassium, Flonase, Protonix, and calcium tablets.  PHYSICAL EXAMINATION:  Vital Signs:  Blood pressure 118/70, pulse 60, respirations 18, and saturation 98%.  General:  She appears to be feeling very well.  She has pink extremities, warm, and well-perfused. Lungs:  Breath sounds are clear.  Cardiac:  Regular.  She has a soft flow murmur through the aortic prosthesis 2/6.  There is no murmur or mitral regurgitation.  Abdomen:  Soft.  Extremities:  She has trace ankle  edema.  IMPRESSION AND PLAN:  The patient still is having a slow recovery from her large surgical operation with double valve repair - replacement and associated maze procedure.  I suspect that some of her symptoms may be due to deconditioning, but she also may have some medication side effects.  I feel that stopping the amiodarone would be safe at this time now 3 months after surgery.  We will also stop the Neurontin that she was taking for some neuritic pain in her right hand.  After stopping her oxygen for several minutes, her saturation remained about 96-97% and I am hopeful we can wean her off her oxygen and later by the end of the summer.  PLAN:  The patient will stop the above 2 medications, continue to try to walk every day.  I will see her back in 2 weeks with a chest x-ray and try ambulating the patient off oxygen to see how she holds her saturations.  I think she really needs to get into the cardiac rehab and allow more recovery time after her significant cardiac procedure.  Kerin Perna, M.D. Electronically Signed  PV/MEDQ  D:  08/18/2010  T:  08/19/2010  Job:  [id number]  cc:   Sigmund Hazel, M.D. Armanda Magic, M.D.

## 2010-08-20 ENCOUNTER — Ambulatory Visit (HOSPITAL_COMMUNITY): Payer: Medicare Other

## 2010-08-23 ENCOUNTER — Ambulatory Visit (HOSPITAL_COMMUNITY): Payer: Medicare Other

## 2010-08-25 ENCOUNTER — Ambulatory Visit (HOSPITAL_COMMUNITY): Payer: Medicare Other

## 2010-08-27 ENCOUNTER — Ambulatory Visit (HOSPITAL_COMMUNITY): Payer: Medicare Other

## 2010-08-27 DIAGNOSIS — K449 Diaphragmatic hernia without obstruction or gangrene: Secondary | ICD-10-CM | POA: Insufficient documentation

## 2010-08-27 DIAGNOSIS — M8580 Other specified disorders of bone density and structure, unspecified site: Secondary | ICD-10-CM

## 2010-08-30 ENCOUNTER — Ambulatory Visit (HOSPITAL_COMMUNITY): Payer: Medicare Other

## 2010-08-31 ENCOUNTER — Other Ambulatory Visit: Payer: Self-pay | Admitting: Cardiothoracic Surgery

## 2010-08-31 DIAGNOSIS — I050 Rheumatic mitral stenosis: Secondary | ICD-10-CM

## 2010-08-31 DIAGNOSIS — I359 Nonrheumatic aortic valve disorder, unspecified: Secondary | ICD-10-CM

## 2010-09-01 ENCOUNTER — Ambulatory Visit (HOSPITAL_COMMUNITY): Payer: Medicare Other

## 2010-09-01 ENCOUNTER — Encounter: Payer: Self-pay | Admitting: Cardiothoracic Surgery

## 2010-09-01 ENCOUNTER — Ambulatory Visit (INDEPENDENT_AMBULATORY_CARE_PROVIDER_SITE_OTHER): Payer: Medicare Other | Admitting: Cardiothoracic Surgery

## 2010-09-01 ENCOUNTER — Ambulatory Visit
Admission: RE | Admit: 2010-09-01 | Discharge: 2010-09-01 | Disposition: A | Discharge status: Home / Self Care | Payer: Medicare Other | Source: Ambulatory Visit | Attending: Cardiothoracic Surgery | Admitting: Cardiothoracic Surgery

## 2010-09-01 ENCOUNTER — Encounter: Payer: Medicare Other | Admitting: Cardiothoracic Surgery

## 2010-09-01 ENCOUNTER — Other Ambulatory Visit: Payer: Self-pay | Admitting: Cardiothoracic Surgery

## 2010-09-01 VITALS — BP 121/68 | HR 62 | Resp 20 | Ht 64.0 in | Wt 150.0 lb

## 2010-09-01 DIAGNOSIS — I509 Heart failure, unspecified: Secondary | ICD-10-CM

## 2010-09-01 DIAGNOSIS — Z9889 Other specified postprocedural states: Secondary | ICD-10-CM

## 2010-09-01 DIAGNOSIS — I050 Rheumatic mitral stenosis: Secondary | ICD-10-CM

## 2010-09-01 DIAGNOSIS — Z952 Presence of prosthetic heart valve: Secondary | ICD-10-CM

## 2010-09-01 DIAGNOSIS — I359 Nonrheumatic aortic valve disorder, unspecified: Secondary | ICD-10-CM

## 2010-09-01 DIAGNOSIS — Z8679 Personal history of other diseases of the circulatory system: Secondary | ICD-10-CM

## 2010-09-01 DIAGNOSIS — Z954 Presence of other heart-valve replacement: Secondary | ICD-10-CM

## 2010-09-01 NOTE — Patient Instructions (Signed)
We will discontinue the home oxygen through the home health agency. The patient will continue her current medications and diet. No prescriptions were provided at this office visit. Patient will return for followup chest x-ray in 2 months.

## 2010-09-01 NOTE — Progress Notes (Signed)
Ashley [name] returns for routine postoperative followup after undergoing mitral valve repair, aortic valve replacement, and atrial Maze procedure probably one year ago. She had a persistent left pleural effusion which has required thoracentesis and is now resolved. She is now normal activity level is returned to work. She still has residual pain and dysesthesia of the right fifth finger which is improving with time probable  cause being brachial plexus stretch at the time of surgery. She denies shortness of breath except when she walks up extended stairs. He has no pedal edema and she is maintaining sinus rhythm.  Exam  Vital signs 120/60 pulse 64 regular saturation room air 96% he appears to be healing very well and indeed looks better now than she has in several weeks. Gallstones are clear equal. The sternum is well-healed. Cardiac rhythm is regular. She has a soft flow murmur 2/6 through the bioprosthetic aortic valve. I hear no murmur of mitral regurgitation at the left axilla. There is no pedal edema present. He is a slight decrease in grip strength of her right hand due to the right fifth finger weakness. There is good perfusion pulses in both extremities.  Impression and plan the patient continue her current medications and continue her current dosing schedule. Plan on seeing her back in approximately 2 months with a chest x-ray to make sure the left pleural effusion does not reaccumulate. His point she does not wish referral to a hand specialist for physical therapy of her right hand treat the decreased grip strength.

## 2010-09-03 ENCOUNTER — Ambulatory Visit (HOSPITAL_COMMUNITY): Payer: Medicare Other

## 2010-09-06 ENCOUNTER — Ambulatory Visit (HOSPITAL_COMMUNITY): Payer: Medicare Other

## 2010-09-08 ENCOUNTER — Ambulatory Visit (HOSPITAL_COMMUNITY): Payer: Medicare Other

## 2010-09-10 ENCOUNTER — Ambulatory Visit (HOSPITAL_COMMUNITY): Payer: Medicare Other

## 2010-09-13 ENCOUNTER — Ambulatory Visit (HOSPITAL_COMMUNITY): Payer: Medicare Other

## 2010-09-15 ENCOUNTER — Ambulatory Visit (HOSPITAL_COMMUNITY): Payer: Medicare Other

## 2010-09-17 ENCOUNTER — Ambulatory Visit (HOSPITAL_COMMUNITY): Payer: Medicare Other

## 2010-09-20 ENCOUNTER — Ambulatory Visit (HOSPITAL_COMMUNITY): Payer: Medicare Other

## 2010-09-22 ENCOUNTER — Ambulatory Visit (HOSPITAL_COMMUNITY): Payer: Medicare Other

## 2010-09-24 ENCOUNTER — Ambulatory Visit (HOSPITAL_COMMUNITY): Payer: Medicare Other

## 2010-09-27 ENCOUNTER — Ambulatory Visit (HOSPITAL_COMMUNITY): Payer: Medicare Other

## 2010-09-29 ENCOUNTER — Ambulatory Visit (HOSPITAL_COMMUNITY): Payer: Medicare Other

## 2010-09-30 LAB — COMPREHENSIVE METABOLIC PANEL
CO2: 27
Calcium: 9.3
Creatinine, Ser: 0.95
GFR calc Af Amer: >60
GFR calc non Af Amer: 59 — ABNORMAL LOW
Glucose, Bld: 95
Sodium: 138
Total Protein: 6.9

## 2010-09-30 LAB — PROTIME-INR
INR: 1.9 — ABNORMAL HIGH
INR: 2 — ABNORMAL HIGH
Prothrombin Time: 22.8 — ABNORMAL HIGH
Prothrombin Time: 23.1 — ABNORMAL HIGH
Prothrombin Time: 31.1 — ABNORMAL HIGH

## 2010-09-30 LAB — MAGNESIUM: Magnesium: 2.2

## 2010-09-30 LAB — LIPID PANEL: VLDL: 40

## 2010-09-30 LAB — CBC
Hemoglobin: 12.6
MCHC: 34.1
MCV: 87.9
RBC: 4.19
RDW: 14.2

## 2010-09-30 LAB — TSH: TSH: 2.56

## 2010-09-30 LAB — APTT: aPTT: 37

## 2010-10-01 ENCOUNTER — Ambulatory Visit (HOSPITAL_COMMUNITY): Payer: Medicare Other

## 2010-10-10 ENCOUNTER — Emergency Department (HOSPITAL_COMMUNITY): Payer: Medicare Other

## 2010-10-10 ENCOUNTER — Inpatient Hospital Stay (HOSPITAL_COMMUNITY)
Admission: EM | Admit: 2010-10-10 | Discharge: 2010-10-15 | DRG: 291 | Disposition: A | Discharge status: Home / Self Care | Payer: Medicare Other | Attending: Internal Medicine | Admitting: Internal Medicine

## 2010-10-10 DIAGNOSIS — I509 Heart failure, unspecified: Secondary | ICD-10-CM | POA: Diagnosis present

## 2010-10-10 DIAGNOSIS — N179 Acute kidney failure, unspecified: Secondary | ICD-10-CM | POA: Diagnosis present

## 2010-10-10 DIAGNOSIS — J189 Pneumonia, unspecified organism: Secondary | ICD-10-CM | POA: Diagnosis present

## 2010-10-10 DIAGNOSIS — I4891 Unspecified atrial fibrillation: Secondary | ICD-10-CM | POA: Diagnosis present

## 2010-10-10 DIAGNOSIS — D649 Anemia, unspecified: Secondary | ICD-10-CM | POA: Diagnosis present

## 2010-10-10 DIAGNOSIS — E785 Hyperlipidemia, unspecified: Secondary | ICD-10-CM | POA: Diagnosis present

## 2010-10-10 DIAGNOSIS — E349 Endocrine disorder, unspecified: Secondary | ICD-10-CM | POA: Diagnosis present

## 2010-10-10 DIAGNOSIS — I5033 Acute on chronic diastolic (congestive) heart failure: Principal | ICD-10-CM | POA: Diagnosis present

## 2010-10-10 DIAGNOSIS — Z7901 Long term (current) use of anticoagulants: Secondary | ICD-10-CM

## 2010-10-10 DIAGNOSIS — I10 Essential (primary) hypertension: Secondary | ICD-10-CM | POA: Diagnosis present

## 2010-10-10 LAB — COMPREHENSIVE METABOLIC PANEL
AST: 16 U/L (ref 0–37)
Albumin: 3.6 g/dL (ref 3.5–5.2)
Alkaline Phosphatase: 94 U/L (ref 39–117)
BUN: 15 mg/dL (ref 6–23)
CO2: 24 mEq/L (ref 19–32)
Chloride: 106 mEq/L (ref 96–112)
GFR calc non Af Amer: >60 mL/min (ref >60)
Potassium: 3.5 mEq/L (ref 3.5–5.1)
Total Bilirubin: 0.4 mg/dL (ref 0.3–1.2)

## 2010-10-10 LAB — DIFFERENTIAL
Eosinophils Absolute: 0.5 10*3/uL (ref 0.0–0.7)
Eosinophils Relative: 8 % — ABNORMAL HIGH (ref 0–5)
Lymphocytes Relative: 16 % (ref 12–46)
Lymphs Abs: 0.9 10*3/uL (ref 0.7–4.0)
Monocytes Relative: 6 % (ref 3–12)

## 2010-10-10 LAB — CBC
HCT: 29 % — ABNORMAL LOW (ref 36.0–46.0)
MCH: 29.6 pg (ref 26.0–34.0)
MCV: 90.3 fL (ref 78.0–100.0)
RDW: 14.8 % (ref 11.5–15.5)
WBC: 5.8 10*3/uL (ref 4.0–10.5)

## 2010-10-10 LAB — APTT: aPTT: 43 seconds — ABNORMAL HIGH (ref 24–37)

## 2010-10-10 LAB — PRO B NATRIURETIC PEPTIDE: Pro B Natriuretic peptide (BNP): 2690 pg/mL — ABNORMAL HIGH (ref 0–125)

## 2010-10-10 LAB — POCT I-STAT TROPONIN I

## 2010-10-10 MED ORDER — IOHEXOL 300 MG/ML  SOLN
100.0000 mL | Freq: Once | INTRAMUSCULAR | Status: AC | PRN
  Administered 2010-10-10: 100 mL via INTRAVENOUS

## 2010-10-11 LAB — BASIC METABOLIC PANEL
Calcium: 9.1 mg/dL (ref 8.4–10.5)
Creatinine, Ser: 0.74 mg/dL (ref 0.50–1.10)
GFR calc Af Amer: >90 mL/min (ref >90)
GFR calc non Af Amer: 84 mL/min — ABNORMAL LOW (ref >90)

## 2010-10-11 LAB — CK TOTAL AND CKMB (NOT AT ARMC)
CK, MB: 1.2 ng/mL (ref 0.3–4.0)
Relative Index: INVALID (ref 0.0–2.5)
Relative Index: INVALID (ref 0.0–2.5)
Total CK: 36 U/L (ref 7–177)

## 2010-10-11 LAB — LIPID PANEL
Cholesterol: 169 mg/dL (ref 0–200)
HDL: 47 mg/dL (ref >39)

## 2010-10-11 LAB — TROPONIN I
Troponin I: <0.3 ng/mL (ref <0.30)
Troponin I: <0.3 ng/mL (ref <0.30)

## 2010-10-11 LAB — DIFFERENTIAL
Basophils Absolute: 0 10*3/uL (ref 0.0–0.1)
Basophils Relative: 1 % (ref 0–1)
Eosinophils Absolute: 0.4 10*3/uL (ref 0.0–0.7)
Eosinophils Relative: 8 % — ABNORMAL HIGH (ref 0–5)
Lymphs Abs: 0.9 10*3/uL (ref 0.7–4.0)

## 2010-10-11 LAB — CBC
MCV: 89.6 fL (ref 78.0–100.0)
Platelets: 260 10*3/uL (ref 150–400)
RDW: 14.7 % (ref 11.5–15.5)
WBC: 5.6 10*3/uL (ref 4.0–10.5)

## 2010-10-11 LAB — MAGNESIUM: Magnesium: 2.1 mg/dL (ref 1.5–2.5)

## 2010-10-11 LAB — TSH: TSH: 15.189 u[IU]/mL — ABNORMAL HIGH (ref 0.350–4.500)

## 2010-10-11 LAB — PROTIME-INR: INR: 2.28 — ABNORMAL HIGH (ref 0.00–1.49)

## 2010-10-12 LAB — BASIC METABOLIC PANEL
BUN: 14 mg/dL (ref 6–23)
Calcium: 9.5 mg/dL (ref 8.4–10.5)
GFR calc non Af Amer: 61 mL/min — ABNORMAL LOW (ref >90)
Glucose, Bld: 96 mg/dL (ref 70–99)
Sodium: 141 mEq/L (ref 135–145)

## 2010-10-13 LAB — URINALYSIS, ROUTINE W REFLEX MICROSCOPIC
Bilirubin Urine: NEGATIVE
Glucose, UA: NEGATIVE
Ketones, ur: NEGATIVE
Leukocytes, UA: NEGATIVE
Nitrite: NEGATIVE
Protein, ur: NEGATIVE
Specific Gravity, Urine: 1.01
Urobilinogen, UA: 0.2
pH: 6

## 2010-10-13 LAB — COMPREHENSIVE METABOLIC PANEL
ALT: 11
Alkaline Phosphatase: 87
BUN: 19
CO2: 27
Chloride: 102
GFR calc non Af Amer: >60
Glucose, Bld: 114 — ABNORMAL HIGH
Potassium: 3.3 — ABNORMAL LOW
Sodium: 142
Total Bilirubin: 0.5
Total Protein: 8.1

## 2010-10-13 LAB — PROTIME-INR
INR: 2.51 — ABNORMAL HIGH (ref 0.00–1.49)
INR: 1.9 — ABNORMAL HIGH
Prothrombin Time: 22.9 — ABNORMAL HIGH

## 2010-10-13 LAB — CBC
HCT: 41.1
Hemoglobin: 14.1
MCHC: 34.3
MCV: 88.6
Platelets: 295
RBC: 4.64
RDW: 12.7
WBC: 7.2

## 2010-10-13 LAB — URINE MICROSCOPIC-ADD ON

## 2010-10-13 LAB — DIFFERENTIAL
Basophils Absolute: 0
Basophils Relative: 0
Eosinophils Absolute: 0.3
Eosinophils Relative: 4
Lymphocytes Relative: 33
Lymphs Abs: 2.3
Monocytes Absolute: 0.4
Monocytes Relative: 6
Neutro Abs: 4.2
Neutrophils Relative %: 57

## 2010-10-13 LAB — COMPREHENSIVE METABOLIC PANEL WITH GFR
AST: 21
Albumin: 4.8
Calcium: 10.1
Creatinine, Ser: 0.9
GFR calc Af Amer: >60

## 2010-10-13 LAB — LIPASE, BLOOD: Lipase: 101

## 2010-10-13 LAB — BASIC METABOLIC PANEL
BUN: 27 mg/dL — ABNORMAL HIGH (ref 6–23)
Chloride: 103 mEq/L (ref 96–112)
GFR calc Af Amer: 47 mL/min — ABNORMAL LOW (ref >90)
GFR calc non Af Amer: 41 mL/min — ABNORMAL LOW (ref >90)
Potassium: 4.1 mEq/L (ref 3.5–5.1)
Sodium: 139 mEq/L (ref 135–145)

## 2010-10-13 LAB — APTT: aPTT: 38 — ABNORMAL HIGH

## 2010-10-14 LAB — CBC
Platelets: 265 10*3/uL (ref 150–400)
RBC: 3.42 MIL/uL — ABNORMAL LOW (ref 3.87–5.11)
RDW: 14.8 % (ref 11.5–15.5)
WBC: 6 10*3/uL (ref 4.0–10.5)

## 2010-10-14 LAB — BASIC METABOLIC PANEL
Calcium: 9.3 mg/dL (ref 8.4–10.5)
GFR calc Af Amer: 40 mL/min — ABNORMAL LOW (ref >90)
GFR calc non Af Amer: 34 mL/min — ABNORMAL LOW (ref >90)
Sodium: 139 mEq/L (ref 135–145)

## 2010-10-14 LAB — PROTIME-INR: INR: 2.49 — ABNORMAL HIGH (ref 0.00–1.49)

## 2010-10-14 LAB — T3, FREE: T3, Free: 2.6 pg/mL (ref 2.3–4.2)

## 2010-10-14 LAB — T4, FREE: Free T4: 1.19 ng/dL (ref 0.80–1.80)

## 2010-10-14 LAB — GLUCOSE, CAPILLARY: Glucose-Capillary: 112 mg/dL — ABNORMAL HIGH (ref 70–99)

## 2010-10-15 NOTE — H&P (Signed)
NAME:  RICCI, PAFF NO.:  [id number]  MEDICAL RECORD NO.:  [id number]  LOCATION:  [facility]                         FACILITY:  [facility]  PHYSICIAN:  Della Goo, M.D. DATE OF BIRTH:  1940-05-14  DATE OF ADMISSION:  10/10/2010 DATE OF DISCHARGE:                             HISTORY & PHYSICAL   ADMISSION DATE:  October 10, 2010.  PRIMARY CARE PHYSICIAN:  Sigmund Hazel, M.D.  CARDIOLOGIST:  Armanda Magic, M.D., Deboraha Sprang.  CHIEF COMPLAINT:  Shortness of breath  HISTORY OF PRESENT ILLNESS:  This is a 70 year old female with a history of cardiac problems, who sees Dr. Armanda Magic, cardiologist, who presents to the emergency department with complaints of worsening shortness of breath over the past 2 weeks.  She also reports having orthopnea and not been able to sleep secondary to her symptoms.  She was seen by her primary care physician 3 days ago evaluated and placed on medications for an upper respiratory infection.  She was given amoxicillin.  She continued to worsen.  Reports having a nonproductive cough and she does state that she did have fever and chills 2 days ago. She denied having any nausea or vomiting.  She denies having any chest pain.  Patient was seen in the emergency department, evaluated and her overall picture was consistent with an exacerbation of CHF.  Her BNP was found to be 2690.  Chest x-ray did reveal a mixed picture of both pulmonary edema as well as possible airspace disease.  Patient was covered for both and referred for medical admission.  PAST MEDICAL HISTORY:  Significant for: 1. History of atrial valve stenosis and mitral valve regurgitation,     status post valvuloplasties with complications, which involved     tearing of the aorta during surgery, requiring repair. 2. History of hypertension. 3. Patient has a history of diastolic congestive heart failure     syndrome.  Her last 2D echo was in June 2012. 4. Patient also has  history of paroxysmal atrial fibrillation, status     post ablations x2 without success, MAZE procedure done. 5. History of dyslipidemia. 6. Hiatal hernia. 7. Umbilical hernia. 8. Diverticulosis/diverticulitis. 9. Degenerative joint disease. 10.Osteopenia. 11.Gastroesophageal reflux disease. 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  PAST SURGICAL HISTORY:  Also includes bilateral cataract surgeries, cholecystectomy, umbilical hernia repair, cervical spine fusion, hysterectomy, bilateral rotator cuff surgeries, left knee arthroscopic surgeries x2.  Patient had cardiac catheterization in April 2012.  MEDICATIONS:  Include carvedilol, Coumadin, Lasix, potassium chloride, losartan, clonidine, Protonix, Tylenol, atorvastatin, aspirin, ferrous sulfate, and calcium carbonate.  ALLERGIES:  No known drug allergies.  SOCIAL HISTORY:  The patient lives alone.  Her son lives nearby in [place].  She is a nonsmoker, nondrinker.  No history of illicit drug usage.  FAMILY HISTORY:  Positive for coronary artery disease and hypertension in both parents.  Her sister and brother are also have hypertension. Her father and sister had diabetic disease and there is no cancer in the family that she knows of.  REVIEW OF SYSTEMS:  Pertinent as mentioned above in the HPI.  All other  organ systems are negative.  PHYSICAL EXAMINATION FINDINGS:  GENERAL:  This is a 70 year old well- nourished, well-developed Caucasian female, who is currently in no acute distress. VITAL SIGNS:  Temperature 98.3, blood pressure 148/75, heart rate 65, respirations 18, O2 sats 93% to 94%.  HEENT EXAMINATION:  Normocephalic, atraumatic.  Pupils are equally round and reactive to light. Extraocular movements are intact.  Funduscopic benign.  There is no scleral icterus.  Nares are patent bilaterally.  Oropharynx is clear. NECK:  Supple.  Full range  of motion.  No thyromegaly, adenopathy, or jugular venous distention. CARDIOVASCULAR:  Regular rate and rhythm.  No murmurs, gallops, or rubs appreciated. LUNGS:  Clear to auscultation bilaterally.  No rales, rhonchi, or wheezes. ABDOMEN:  Positive bowel sounds, soft, nontender, nondistended.  No hepatosplenomegaly. EXTREMITIES:  Without cyanosis, clubbing.  She does have 1+ pedal edema. NEUROLOGIC EXAMINATION:  Nonfocal.  LABORATORY STUDIES:  White blood cell count 5.8, hemoglobin 9.5, hematocrit 29.0, MCV 90.3, platelets 237, neutrophils 70% lymphocytes 16%.  Chemistry with a sodium of 140, potassium 3.5, chloride 106, carbon dioxide 24, BUN 15, creatinine 0.78, and glucose of 115. Prothrombin time 24.9, INR 2.21, PTT 43.  Beta natriuretic peptide 2690.0.  Chest x-ray as mentioned above.  ASSESSMENT:  A 70 year old female being admitted with: 1. Shortness of breath. 2. Acute exacerbation of diastolic congestive heart failure syndrome. 3. Community-acquired pneumonia. 4. Hypertension. 5. History of atrial fibrillation, on Coumadin therapy. 6. Normocytic anemia. 7. Dyslipidemia.  PLAN:  Patient will be admitted to a telemetry area for monitoring. Cardiac enzymes will also be performed.  Patient will be placed on the CHF protocol as well and IV Lasix has been administered and will continue to be administered 40 mg IV q.12 hours x2 doses at this time with potassium replacement therapy.  Patient has also been placed on antibiotic therapy to cover community-acquired pneumonia with Rocephin IV and azithromycin therapy p.o.  Nebulizer treatments have also been ordered and patient's regular medications will be further reconciled.  Patient's Coumadin therapy is therapeutic at this time and will be monitored and further adjusted as needed.  Patient is a full code.     Della Goo, M.D.     HJ/MEDQ  D:  10/11/2010  T:  10/11/2010  Job:  [id number]  cc:   Sigmund Hazel,  M.D. Fax: [phone]  Armanda Magic, M.D. Fax: [phone]  Electronically Signed by Della Goo M.D. on 10/15/2010 09:32:54 PM

## 2010-10-21 NOTE — Discharge Summary (Signed)
Ashley [name], Ashley [name]               ACCOUNT NO.:  [id number]  MEDICAL RECORD NO.:  [id number]  LOCATION:  1441                         FACILITY:  [facility]  PHYSICIAN:  Baltazar Najjar, MD     DATE OF BIRTH:  1940-11-09  DATE OF ADMISSION:  10/10/2010 DATE OF DISCHARGE:                              DISCHARGE SUMMARY   FINAL DISCHARGE DIAGNOSES: 1. Acute on chronic diastolic congestive heart failure. 2. Paroxysmal atrial fibrillation. 3. Community-acquired pneumonia. 4. Acute kidney injury. 5. Subclinical hypothyroidism.  SECONDARY DISCHARGE DIAGNOSES: 1. History of atrial valve stenosis and mitral valve regurgitation,     status post valvuloplasty, status post complicated aortic tear     requiring repair. 2. Hypertension. 3. Dyslipidemia. 4. Diverticulosis. 5. Gastroesophageal reflux disease. 6. Degenerative joint disease.  CONSULTATIONS:  [facility], the patient was seen by Dr. Donato Schultz.  RADIOLOGY/IMAGING STUDIES:1. CT angiogram of the chest showed no evidence of pulmonary embolism.     Cardiomegaly, post aortic and mitral valve replacement with     findings suggestive of mild pulmonary edema and small bilateral     effusions, nonspecific lung changes may be seen in the setting of     airway disease. 2. Chest x-ray showed mild congestive changes in the heart and lung     with a slight interstitial edema and small bilateral pleural     effusions, changes demonstrated slight progression from previous     study, also showed new atelectasis in the right upper lung.  BRIEF ADMITTING HISTORY:  Please refer to H and P for more details.  On summary, [name] is a 70 year old Caucasian woman with history of diastolic congestive heart failure and paroxysmal atrial fibrillation.  She is followed by Dr. Armanda Magic from [facility], presented to the ER on October 11, 2010 with chief complaint of shortness of breath.  Please refer to H and P for more  details.  HOSPITAL COURSE BY PROBLEM LIST: 1. Acute on chronic diastolic congestive heart failure.  The patient     was admitted to the telemetry floor.  She was started on IV Lasix     and she was continued on her ACE inhibitor and beta blockers, and     she was diuresed aggressively with IV Lasix, and subsequently     switched to p.o. Lasix with potassium supplementation.  The patient     showed significant improvement in her volume status, and her     diastolic congestive heart failure had improved with this approach. 2. Community-acquired pneumonia.  The patient was initially started on     Rocephin and Zithromax, then that was switched to Avelox.  She will     be discharged on Avelox to complete 7 days of therapy.  She was     also continued on nebulizer treatment as needed for wheezing and     shortness of breath. 3. Paroxysmal atrial fibrillation.  The patient was on sinus rhythm     initially, however, she had one episode of pause of 2.6 seconds and     converted to AFib.  However, that was transient and she converted     back to sinus rhythm  afterwards.  The patient was seen by [facility]     [facility]iology, and they are recommending to discharge her on Coreg for     now, however, consideration of antiarrhythmics can be discussed as     an outpatient with her primary cardiologist.  Her rate is currently     controlled with Coreg.  She is also on Coumadin with therapeutic     INR. 4. Acute kidney injury, most likely secondary to diuresis, currently     stable.  Her creatinine was 1.5 on discharge.  I am holding her     losartan for now, however, we will continue with Lasix at 40 mg     b.i.d.  The patient will need repeat basic metabolic panel as an     outpatient to re-assess her kidney function and ARB or ACE     inhibitor can be resumed at some point when her creatinine is     stabilized. 5. Subclinical hypothyroidism.  The patient noted to have an elevated     TSH of 15, however,  her free T3 and T4 level both within normal     range.  At this point, I will hold off any thyroxin therapy,     however, she will need repeat thyroid function tests probably in 4-     6 weeks or a couple of months to reassess her thyroid function, and     I will defer that to her PCP. 6. Hypertension, fairly controlled.  As I mentioned above, I am     holding her ARB secondary to acute kidney injury, however, I     increased her clonidine to 0.1 mg t.i.d., and she will continue on     Coreg and monitor her blood pressure for any further adjustment to     be done as an outpatient. 7. Poor social situation.  The patient was seen by social worker and     case manager to help her to apply for food stamp as an outpatient     and financial assistance.  She was provided by resources regarding     her financial situation. 8. The patient was seen and examined by me today.  She is alert and     oriented x3.  Denies any complaints.  Lung exam is clear.  Her O2     sat is 98% on room air, and she is stable for discharge to home     today.  She was also seen by PT/OT, and there is no need for any     home physical therapy identified.  DISCHARGE MEDICATIONS: 1. Avelox 400 mg p.o. daily for two more days. 2. Clonidine 0.1 mg p.o. three times a day. 3. Lasix 40 mg p.o. twice daily. 4. Aspirin 81 mg 1 tablet p.o. daily. 5. Calcium carbonate 1200 mg p.o. daily. 6. Coumadin 2.5 mg 1 tablet p.o. daily. 7. Carvedilol 25 mg 1 tablet p.o. twice daily with meals. 8. Ferrous sulfate 325 mg p.o. twice daily. 9. Lipitor 40 mg p.o. daily. 10.Protonix 40 mg p.o. daily. 11.Potassium chloride 20 mEq p.o. daily. 12.Tylenol 325 mg 2 tablets p.o. daily as needed.  DISCHARGE INSTRUCTIONS: 1. The patient to continue above medications as prescribed. 2. The patient to go for repeat basic metabolic panel and PT/INR on     Monday, reports to be called to her PCP for further adjustment of     her Coumadin dose and  check on her renal function. 3. The  patient to follow up with her PCP and her cardiologist,     preferably within 1 week of discharge. 4. The patient to report any worsening of symptoms or any symptoms to     her PCP or come to the ED if needed.  CONDITION ON DISCHARGE:  Improved.          ______________________________ Baltazar Najjar, MD     SA/MEDQ  D:  10/15/2010  T:  10/15/2010  Job:  [id number]  cc:   Armanda Magic, M.D. Fax: [phone]  Sigmund Hazel, M.D. Fax: [phone]  Electronically Signed by Hannah Beat MD on 10/21/2010 08:17:01 AM

## 2010-10-28 ENCOUNTER — Other Ambulatory Visit: Payer: Self-pay | Admitting: Cardiothoracic Surgery

## 2010-10-28 DIAGNOSIS — I359 Nonrheumatic aortic valve disorder, unspecified: Secondary | ICD-10-CM

## 2010-10-28 DIAGNOSIS — I059 Rheumatic mitral valve disease, unspecified: Secondary | ICD-10-CM

## 2010-11-03 ENCOUNTER — Encounter: Payer: Self-pay | Admitting: Cardiothoracic Surgery

## 2010-11-03 ENCOUNTER — Ambulatory Visit
Admission: RE | Admit: 2010-11-03 | Discharge: 2010-11-03 | Disposition: A | Discharge status: Home / Self Care | Payer: Medicare Other | Source: Ambulatory Visit | Attending: Cardiothoracic Surgery | Admitting: Cardiothoracic Surgery

## 2010-11-03 ENCOUNTER — Ambulatory Visit (INDEPENDENT_AMBULATORY_CARE_PROVIDER_SITE_OTHER): Payer: Medicare Other | Admitting: Cardiothoracic Surgery

## 2010-11-03 DIAGNOSIS — I059 Rheumatic mitral valve disease, unspecified: Secondary | ICD-10-CM

## 2010-11-03 DIAGNOSIS — Z952 Presence of prosthetic heart valve: Secondary | ICD-10-CM

## 2010-11-03 DIAGNOSIS — Z954 Presence of other heart-valve replacement: Secondary | ICD-10-CM

## 2010-11-03 DIAGNOSIS — I509 Heart failure, unspecified: Secondary | ICD-10-CM

## 2010-11-03 DIAGNOSIS — J90 Pleural effusion, not elsewhere classified: Secondary | ICD-10-CM

## 2010-11-03 DIAGNOSIS — I359 Nonrheumatic aortic valve disorder, unspecified: Secondary | ICD-10-CM

## 2010-11-03 DIAGNOSIS — Z9889 Other specified postprocedural states: Secondary | ICD-10-CM

## 2010-11-03 DIAGNOSIS — I340 Nonrheumatic mitral (valve) insufficiency: Secondary | ICD-10-CM

## 2010-11-03 NOTE — Progress Notes (Signed)
HPI  The patient returns for a 2 month followup. She was hospitalized in late September 4 respiratory congestion, pneumonia, some recurrent atrial fibrillation, and shortness of breath. A chest CT scan rule out pulmonary embolus. I reviewed the scan and there is no evidence of pericardial effusion, the aortic repair site was intact. And there is no significant pleural effusion. She is doing well at home on her current meds. She denies any bleeding problems from the Coumadin. She has gone back to work at [employer] but was hospitalized for her upper respiratory infection and has not yet returned. She will probably stay out of work for the winter fluid season. She denies any symptoms of CHF.   Current Outpatient Prescriptions  Medication Sig Dispense Refill  . acetaminophen (TYLENOL) 325 MG tablet Take 650 mg by mouth every 6 (six) hours as needed.        Marland Kitchen aspirin 81 MG EC tablet Take 81 mg by mouth daily.        Marland Kitchen atorvastatin (LIPITOR) 40 MG tablet Take 40 mg by mouth daily.        . calcium carbonate 1250 MG capsule Take 1,250 mg by mouth daily.        . carvedilol (COREG) 12.5 MG tablet Take 25 mg by mouth 2 (two) times daily with a meal.       . cloNIDine HCl (KAPVAY) 0.1 MG TB12 ER tablet Take 0.1 mg by mouth 2 (two) times daily.        . ferrous sulfate 325 (65 FE) MG EC tablet Take 325 mg by mouth 2 (two) times daily with a meal.        . furosemide (LASIX) 40 MG tablet Take 40 mg by mouth daily.       . pantoprazole (PROTONIX) 40 MG tablet Take 40 mg by mouth daily.        . potassium chloride SA (K-DUR,KLOR-CON) 20 MEQ tablet Take 20 mEq by mouth daily.        Marland Kitchen warfarin (COUMADIN) 2.5 MG tablet Take 2.5 mg by mouth daily.           Review of Systems:  No weight loss no fever. Her overall strength is improved. Her right hand grip is improved. Right-hand discomfort at night is also improved.  Physical Exam Blood pressure 122/70 pulse 80 and regular saturation 98% weight 148  pounds General appearance is that of a pleasant Caucasian female no acute distress. Breath sounds are clear and equal. Cardiac rhythm is regular. There is a systolic flow murmur through the aortic valve without diastolic murmur. There is no murmur of mitral insufficiency. There is no pedal edema and peripheral pulses are intact.   Diagnostic Tests:  Chest x-ray reveals clear lung fields no pleural effusion cardiac size normal.  Impression:  The patient is now clinically stable following aVR mitral valve repair repair of descending aortic tear and Maze procedure. No changes in medications were recommended at this time. The patient's last 2-D echo was 4 months ago which showed a gradient across the biologic aortic valve which is probably an overestimate due to her recent surgery, anemia, and hyperdynamic state.  Plan:  I'll plan on seeing the patient back in 3-4 months with a ches

## 2010-11-03 NOTE — Patient Instructions (Signed)
Walk 20 min 4 days a week . For sustained episodes of afib notify your cardiologist and take an extra 1/2 tablet of carvedilol if persists more than an hour

## 2010-11-08 NOTE — Consult Note (Signed)
Ashley [name], WARTMAN NO.:  [id number]  MEDICAL RECORD NO.:  [id number]  LOCATION:  1441                         FACILITY:  [facility]  PHYSICIAN:  Corky Crafts, MDDATE OF BIRTH:  08/16/1940  DATE OF CONSULTATION:  10/13/2010 DATE OF DISCHARGE:                                CONSULTATION   PRIMARY CARDIOLOGIST:  Armanda Magic, M.D.  REASON FOR CONSULTATION:  Atrial fibrillation and shortness of breath.  HISTORY OF PRESENT ILLNESS:  The patient is a 70 year old with paroxysmal atrial fibrillation who had an aortic valve replacement and mitral valve repair about 6 months ago.  She presented with shortness of breath, orthopnea, and some dyspnea on exertion.  The dyspnea on exertion has been chronic since her surgery.  X-ray showed a mixed picture consistent with both pulmonary edema as well as infectious etiology.  She has been admitted and treated for pneumonia, diuresed due to heart failure symptoms.  While in the hospital, she has had some paroxysmal atrial fibrillation.  Upon converting from atrial fibrillation to normal sinus rhythm, she had a 2.6 second pause documented on telemetry.  She was lying down at that time.  She is unsure, but thinks she may have had a little bit of light-headedness at about that time.  She has not had any syncope.  She has not had any trouble when walking around in terms of getting light-headed.  She does not report chest pain or bleeding problems.  PAST MEDICAL HISTORY: 1. Valvular heart disease as noted above. 2. Atrial fibrillation. 3. Systolic dysfunction. 4. Diastolic dysfunction. 5. Hypertension. 6. Hyperlipidemia. 7. Arthritis. 8. GERD.  PAST SURGICAL HISTORY: 1. Heart surgery including mitral valve repair and aortic valve     replacement. 2. Hernia surgery. 3. Shoulder surgery. 4. Cholecystectomy. 5. Neck surgery. 6. Knee surgery.  MEDICATIONS IN THE HOSPITAL: 1. Aspirin 81 mg daily. 2. Carvedilol 25 mg  b.i.d. 3. Catapres 0.1 p.o. t.i.d. 4. Iron. 5. Furosemide 40 mg p.o. b.i.d. 6. Avelox. 7. Pantoprazole. 8. MiraLax. 9. Potassium. 10.Crestor 20 mg daily. 11.Sodium. 12.Warfarin.  ALLERGIES: 1. DARVOCET. 2. CODEINE. 3. IODINE.  SOCIAL HISTORY:  She quit smoking in the past.  FAMILY HISTORY:  Significant for hypertension and heart disease.  REVIEW OF SYSTEMS:  Significant for shortness of breath, some cough, orthopnea, no swelling, no bleeding problems.  All other systems negative.  PHYSICAL EXAMINATION:  VITAL SIGNS:  Blood pressure 121/76, heart rate of 86. GENERAL:  She is awake, alert, in no apparent distress. HEAD:  Normocephalic, atraumatic. EYES:  Extraocular movements intact. NECK:  No JVD. CARDIOVASCULAR:  Regular rate and rhythm.  S1, S2. LUNGS:  Clear to auscultation bilaterally. ABDOMEN:  Soft, nontender, nondistended. EXTREMITIES:  No edema. NEURO:  No focal deficits.  IMAGING STUDIES:  ECG shows normal sinus rhythm.  CT angio of the chest showed no PE, cardiomegaly, mild effusions.  LABORATORY DATA:  Potassium 4.1, creatinine 1.29.  INR 2.5.  ASSESSMENT:  A 70 year old with paroxysmal atrial fibrillation.  PLAN: 1. Atrial fibrillation, 2.6-second pause with conversion from atrial     fibrillation and normal sinus rhythm, no syncope.  I do not think     there is a  clear indication for a pacemaker at this time.  Consider     an antiarrhythmic to help maintain normal sinus rhythm thus     reducing pauses associated with conversion from atrial fibrillation     to sinus rhythm.  We will discuss this with Dr. Mayford Knife.  The     patient was on amiodarone in the past. 2. Diastolic heart failure.  She appears euvolemic now, will likely     need chronic Lasix due to diastolic and     systolic dysfunction.  She was on 20 b.i.d.  She may need a higher     dose at home.  She will also need to restrict salt intake. 3. Continue antibiotics for  pneumonia.     Corky Crafts, MD     JSV/MEDQ  D:  10/13/2010  T:  10/14/2010  Job:  [id number]  Electronically Signed by Lance Muss MD on 11/08/2010 01:23:55 PM

## 2011-02-23 ENCOUNTER — Other Ambulatory Visit: Payer: Self-pay | Admitting: Cardiothoracic Surgery

## 2011-02-23 DIAGNOSIS — I059 Rheumatic mitral valve disease, unspecified: Secondary | ICD-10-CM

## 2011-03-02 ENCOUNTER — Ambulatory Visit: Payer: Medicare Other | Admitting: Cardiothoracic Surgery

## 2011-03-02 ENCOUNTER — Ambulatory Visit
Admission: RE | Admit: 2011-03-02 | Discharge: 2011-03-02 | Disposition: A | Discharge status: Home / Self Care | Payer: Medicare Other | Source: Ambulatory Visit | Attending: Cardiothoracic Surgery | Admitting: Cardiothoracic Surgery

## 2011-03-02 ENCOUNTER — Ambulatory Visit (INDEPENDENT_AMBULATORY_CARE_PROVIDER_SITE_OTHER): Payer: Medicare Other | Admitting: Cardiothoracic Surgery

## 2011-03-02 ENCOUNTER — Encounter: Payer: Self-pay | Admitting: Cardiothoracic Surgery

## 2011-03-02 VITALS — BP 130/78 | HR 66 | Resp 18 | Ht 64.0 in | Wt 152.0 lb

## 2011-03-02 DIAGNOSIS — I4891 Unspecified atrial fibrillation: Secondary | ICD-10-CM

## 2011-03-02 DIAGNOSIS — I059 Rheumatic mitral valve disease, unspecified: Secondary | ICD-10-CM

## 2011-03-02 DIAGNOSIS — Z9889 Other specified postprocedural states: Secondary | ICD-10-CM

## 2011-03-02 DIAGNOSIS — I060 Rheumatic aortic stenosis: Secondary | ICD-10-CM

## 2011-03-02 DIAGNOSIS — I509 Heart failure, unspecified: Secondary | ICD-10-CM

## 2011-03-02 NOTE — Progress Notes (Signed)
PCP is Karsten Fells, MD, MD Referring Provider is Quintella Reichert, MD  Chief Complaint  Patient presents with  . Routine Post Op    4 month f/u wtih CXR, S/P AVR, Mitral valve repair of descending aortic tear and Maze procedure    HPI: #1 status post mitral valve repair, aortic valve replaced with a tissue valve, and Maze procedure for paroxysmal atrial fibrillation April 2012         #2 hypertension          #3 chronic Coumadin therapy without bleeding complications           #4 right hand ulnar nerve pain-dysesthesia Past Medical History  Diagnosis Date  . HTN (hypertension)   . Dyslipidemia   . GERD (gastroesophageal reflux disease)   . Mitral regurgitation     on recent TEE 07/07/2010  . Chronic a-fib     on coumadin  . DJD (degenerative joint disease), cervical     s/p epidural steroid injections  . Hiatal hernia   . Osteopenia     Past Surgical History  Procedure Date  . Total vaginal hysterectomy   . Cholecystectomy   . Left knee surgery   . Bilateral shoulder surgery   . Umbilical hernia repair x2   . Mitral valve repair 04/29/2010    26-mm Edwards annuloplasty ring and posterior commissure plasty, Dr Donata Clay   . Aortic valve replacement 04/29/2010    19 mm supra-annular pericardial tissue valve(serial number [id number]) Dr Donata Clay  . Cox-maze microwave ablation 04/29/2010    left sided, Dr Donata Clay  . Delayed sternal closure 04/30/2010    Dr Donata Clay  . Cardiac catheterization 04/27/2010    Dr Armanda Magic  . Cardioverted 05/19/2010    Dr Donato Schultz  . C5-c6 fusion   . Rf catheter ablation of afib 11/2009    Family History  Problem Relation Age of Onset  . Diabetes type II    . Coronary artery disease Father     s/p cabg in the 1970's  . Heart disease    . Heart disease Father   . Heart attack Mother   . Hypertension Brother   . Hypertension Sister   . Parkinsonism Sister     Social History History  Substance Use Topics  . Smoking  status: Former Smoker    Types: Cigarettes  . Smokeless tobacco: Not on file  . Alcohol Use: No    Current Outpatient Prescriptions  Medication Sig Dispense Refill  . acetaminophen (TYLENOL) 325 MG tablet Take 650 mg by mouth every 6 (six) hours as needed.        Marland Kitchen aspirin 81 MG EC tablet Take 81 mg by mouth daily.        Marland Kitchen atorvastatin (LIPITOR) 40 MG tablet Take 40 mg by mouth daily.        . calcium carbonate 1250 MG capsule Take 1,250 mg by mouth daily.        . carvedilol (COREG) 12.5 MG tablet Take 25 mg by mouth 2 (two) times daily with a meal.       . cloNIDine HCl (KAPVAY) 0.1 MG TB12 ER tablet Take 0.1 mg by mouth 2 (two) times daily.        . ferrous sulfate 325 (65 FE) MG EC tablet Take 325 mg by mouth 2 (two) times daily with a meal.        . furosemide (LASIX) 40 MG tablet Take 40 mg by mouth  daily.       . pantoprazole (PROTONIX) 40 MG tablet Take 40 mg by mouth daily.        . potassium chloride SA (K-DUR,KLOR-CON) 20 MEQ tablet Take 20 mEq by mouth daily.        Marland Kitchen warfarin (COUMADIN) 2.5 MG tablet Take 2.5 mg by mouth daily.         Allergies  Allergen Reactions  . Codeine Nausea And Vomiting  . Darvocet (Propoxyphene N-Acetaminophen) Nausea And Vomiting  . Iodine     Review of Systems the patient is moved to [facility] where she is reestablished care. She will continue to returned to [facility] for her cardiology care with Dr. Mayford Knife and myself. Her weight has been stable. She denies shortness of breath or pulmonary problems. She complains of some palpitations at night laying on her left side. She has had no bleeding complications from her Coumadin therapy. Her primary care physician in [place] is following the INR. BP 130/78  Pulse 66  Resp 18  Ht 5\' 4"  (1.626 m)  Wt 152 lb (68.947 kg)  BMI 26.09 kg/m2  SpO2 98% Physical Exam Alert and pleasant Heart rate regular Soft flow murmur through the aortic valve, no murmur of MR, no gallop Extremities  without edema Slightly decreased grip strength in the right hand compared to left otherwise neuro intact  Diagnostic Tests: Chest x-ray today shows clear lung fields no pleural effusion stable cardiac silhouette  Impression: Patient is doing well from a cardiac standpoint. I'll see her back for review in 6 months. I stressed the importance of blood pressure control to the patient. If her blood pressure becomes an issue out recommend increasing her clonidine to 0.2 mg twice a day   Plan:

## 2011-07-14 IMAGING — CR DG CHEST 2V SAME DAY
2 series · 2 of 2 positions shown · non-contrast
Comparison: Chest x-ray of 06/21/1998

CLINICAL DATA: Follow up thoracentesis

CHEST - 2 VIEW SAME DAY

[w chest pa]
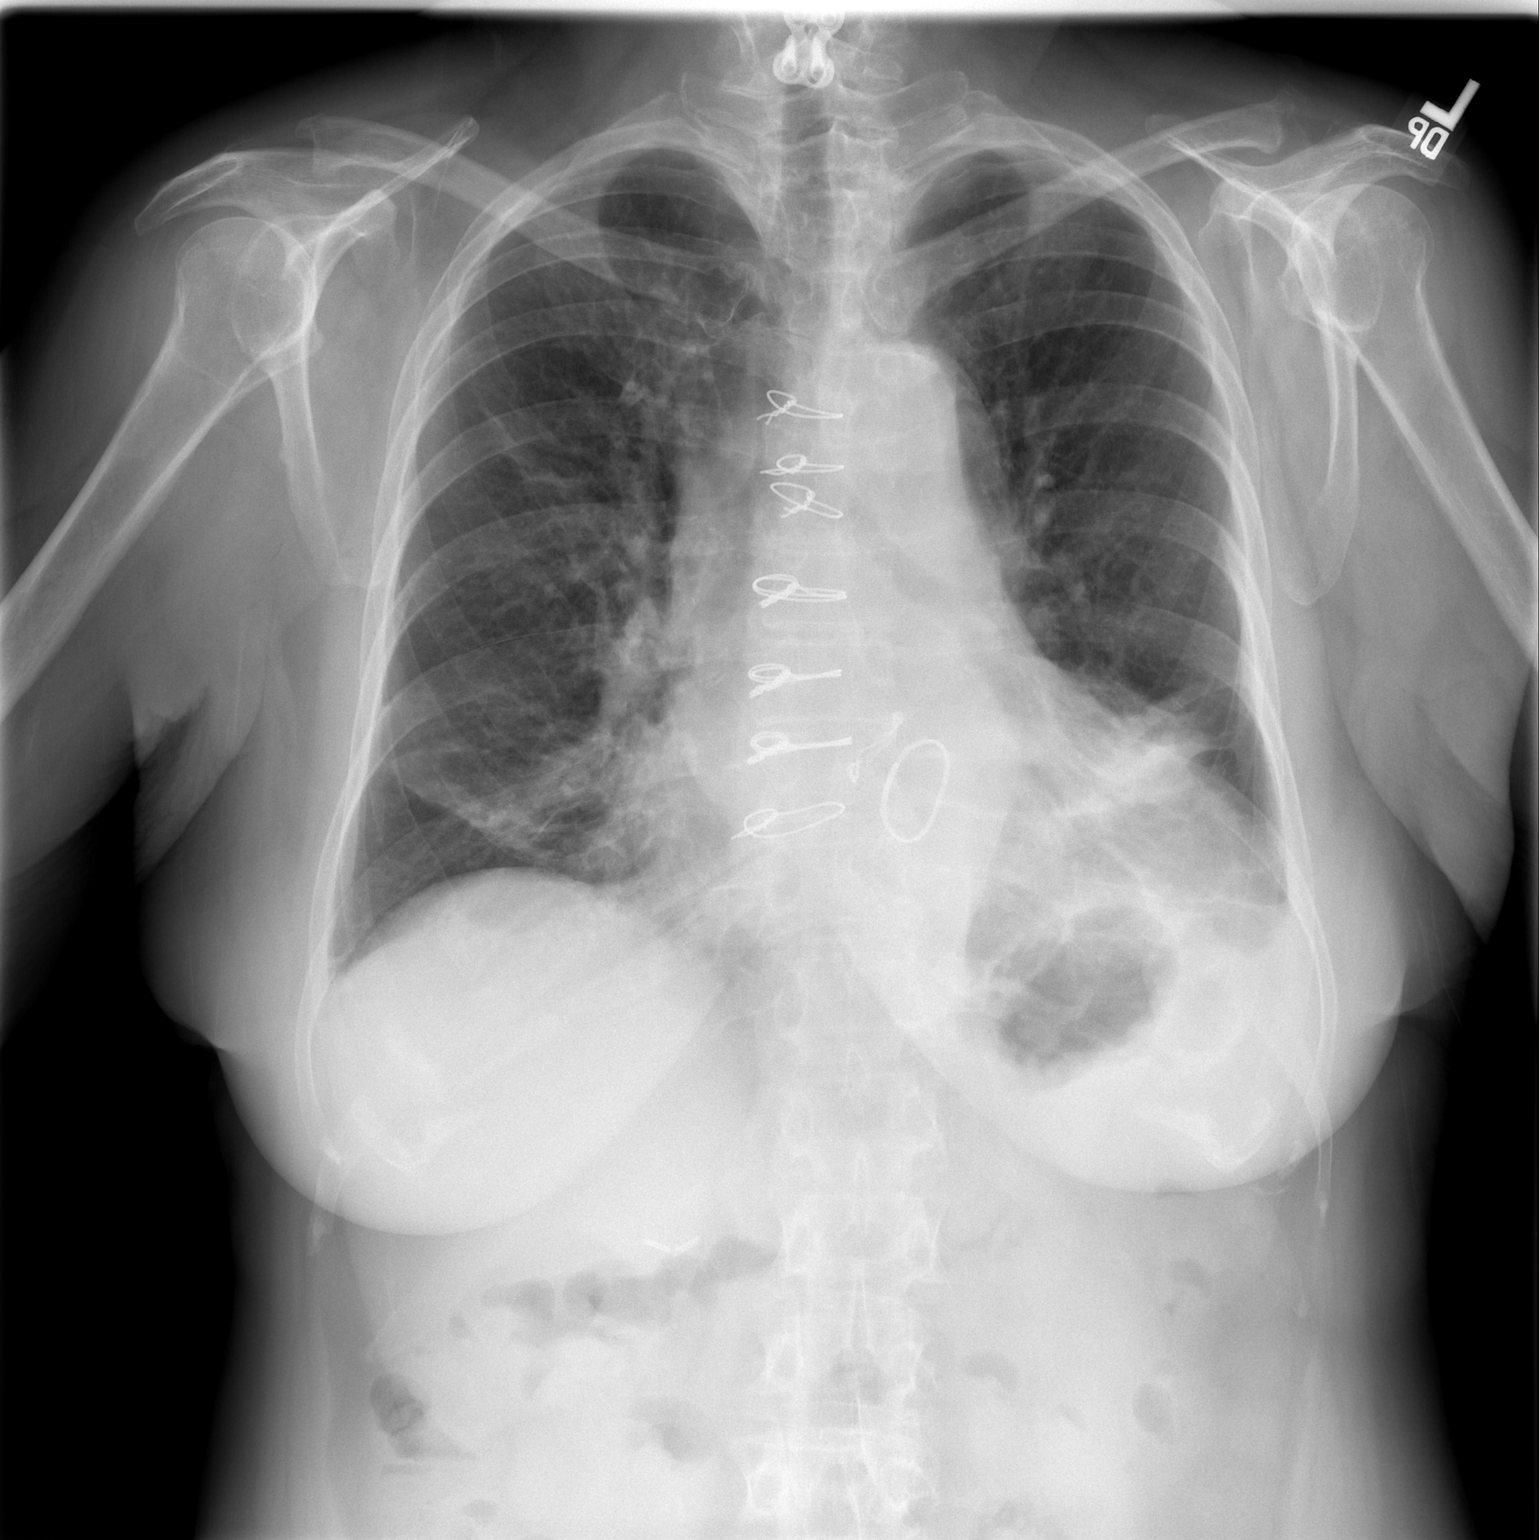

[w chest lat]
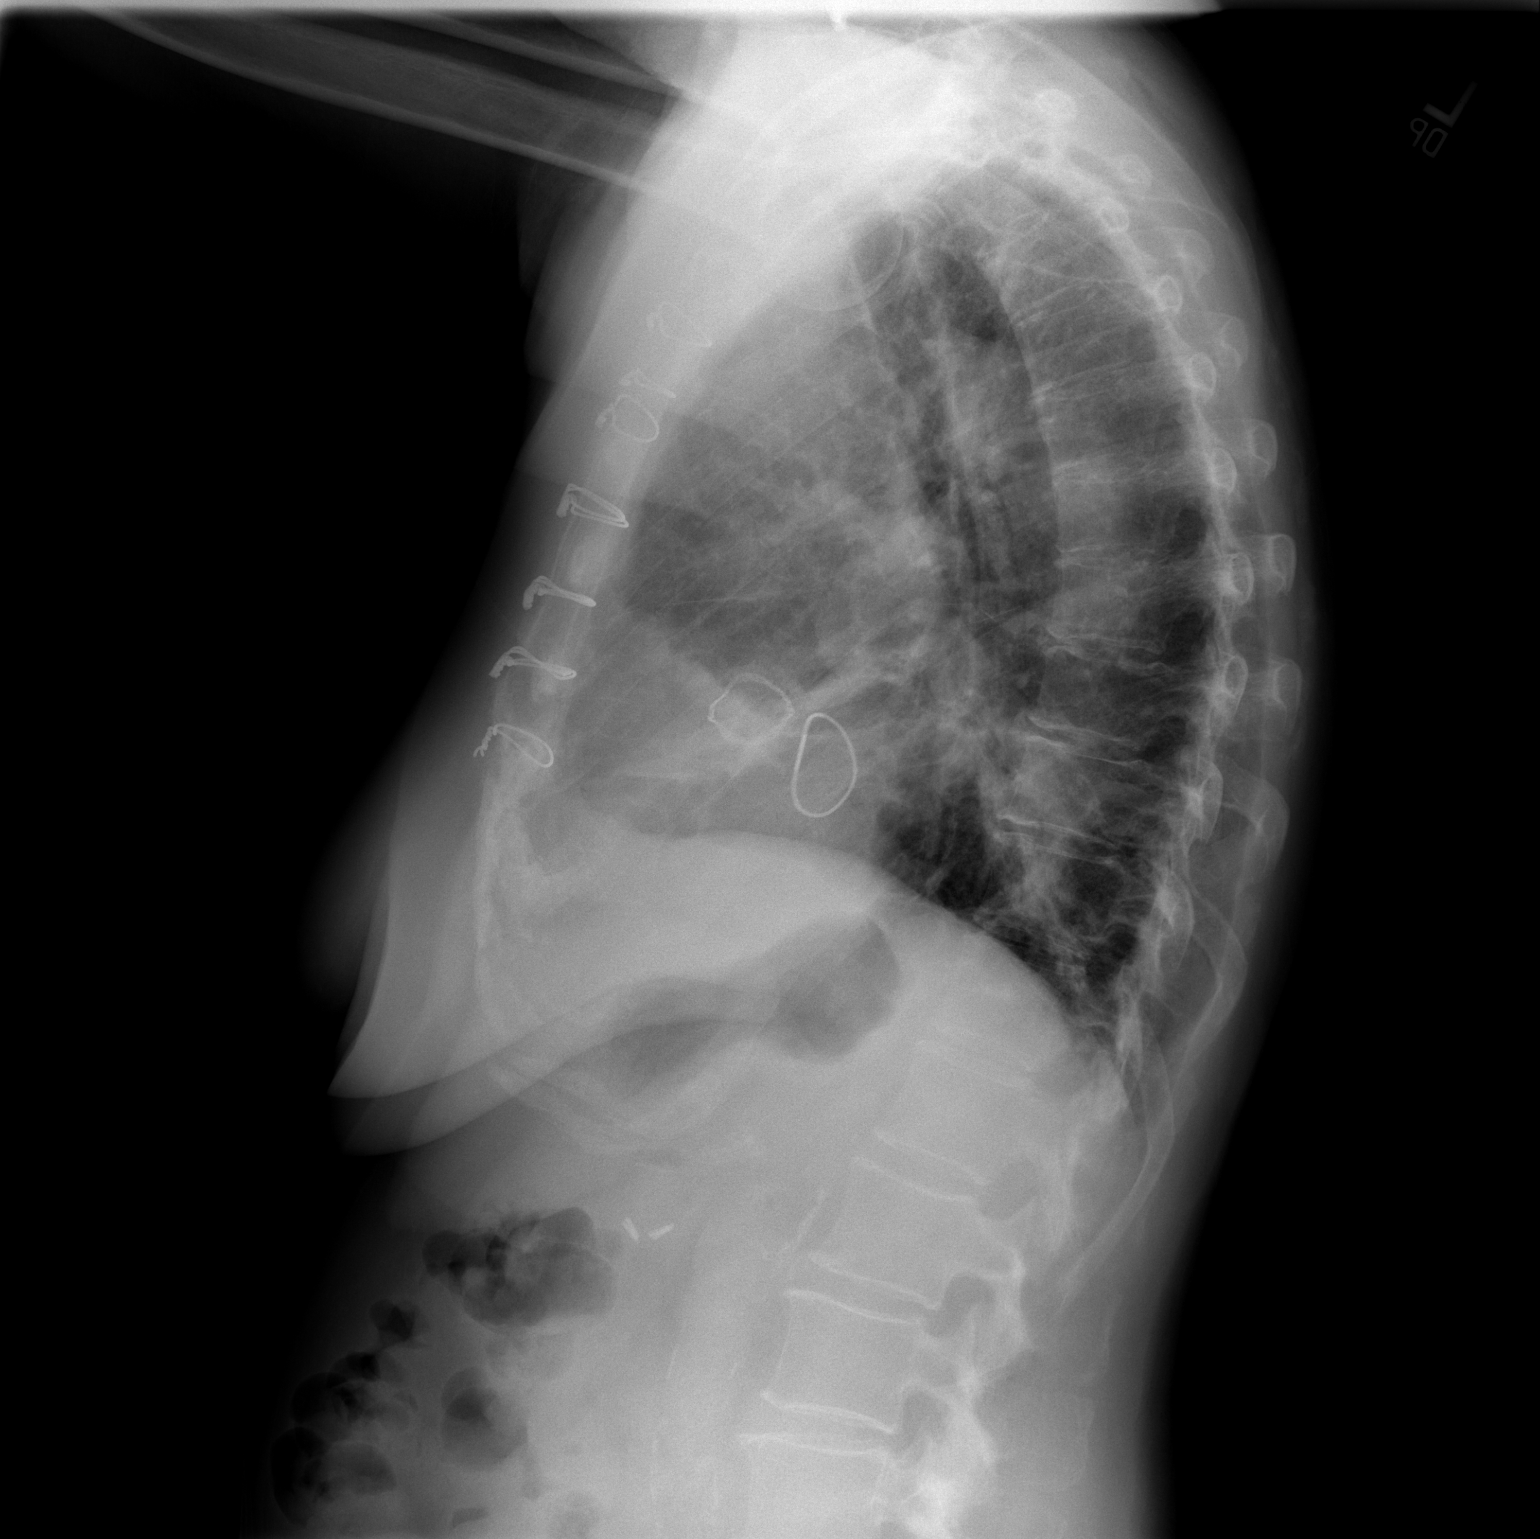

[2 of 2 positions shown; findings below may reference images not displayed]

FINDINGS: It appears that a left thoracentesis has been performed
with evacuation of the vast majority of the left pleural effusion.
Linear atelectasis is present at both lung bases left greater than
right. No pneumothorax is seen.  Cardiomegaly is stable.  Median
sternotomy sutures are noted. Mitral valve and aortic valve
replacements are present.
IMPRESSION: Evacuation of the left pleural effusion after left thoracentesis.
No pneumothorax.  Basilar linear atelectasis.

## 2011-07-29 IMAGING — CR DG CHEST 2V
2 series · 2 of 2 positions shown · non-contrast
Comparison: 06/25/2010.

CLINICAL DATA: Shortness of breath.  Weakness.  Former smoker.

CHEST - 2 VIEW

[w chest lat]
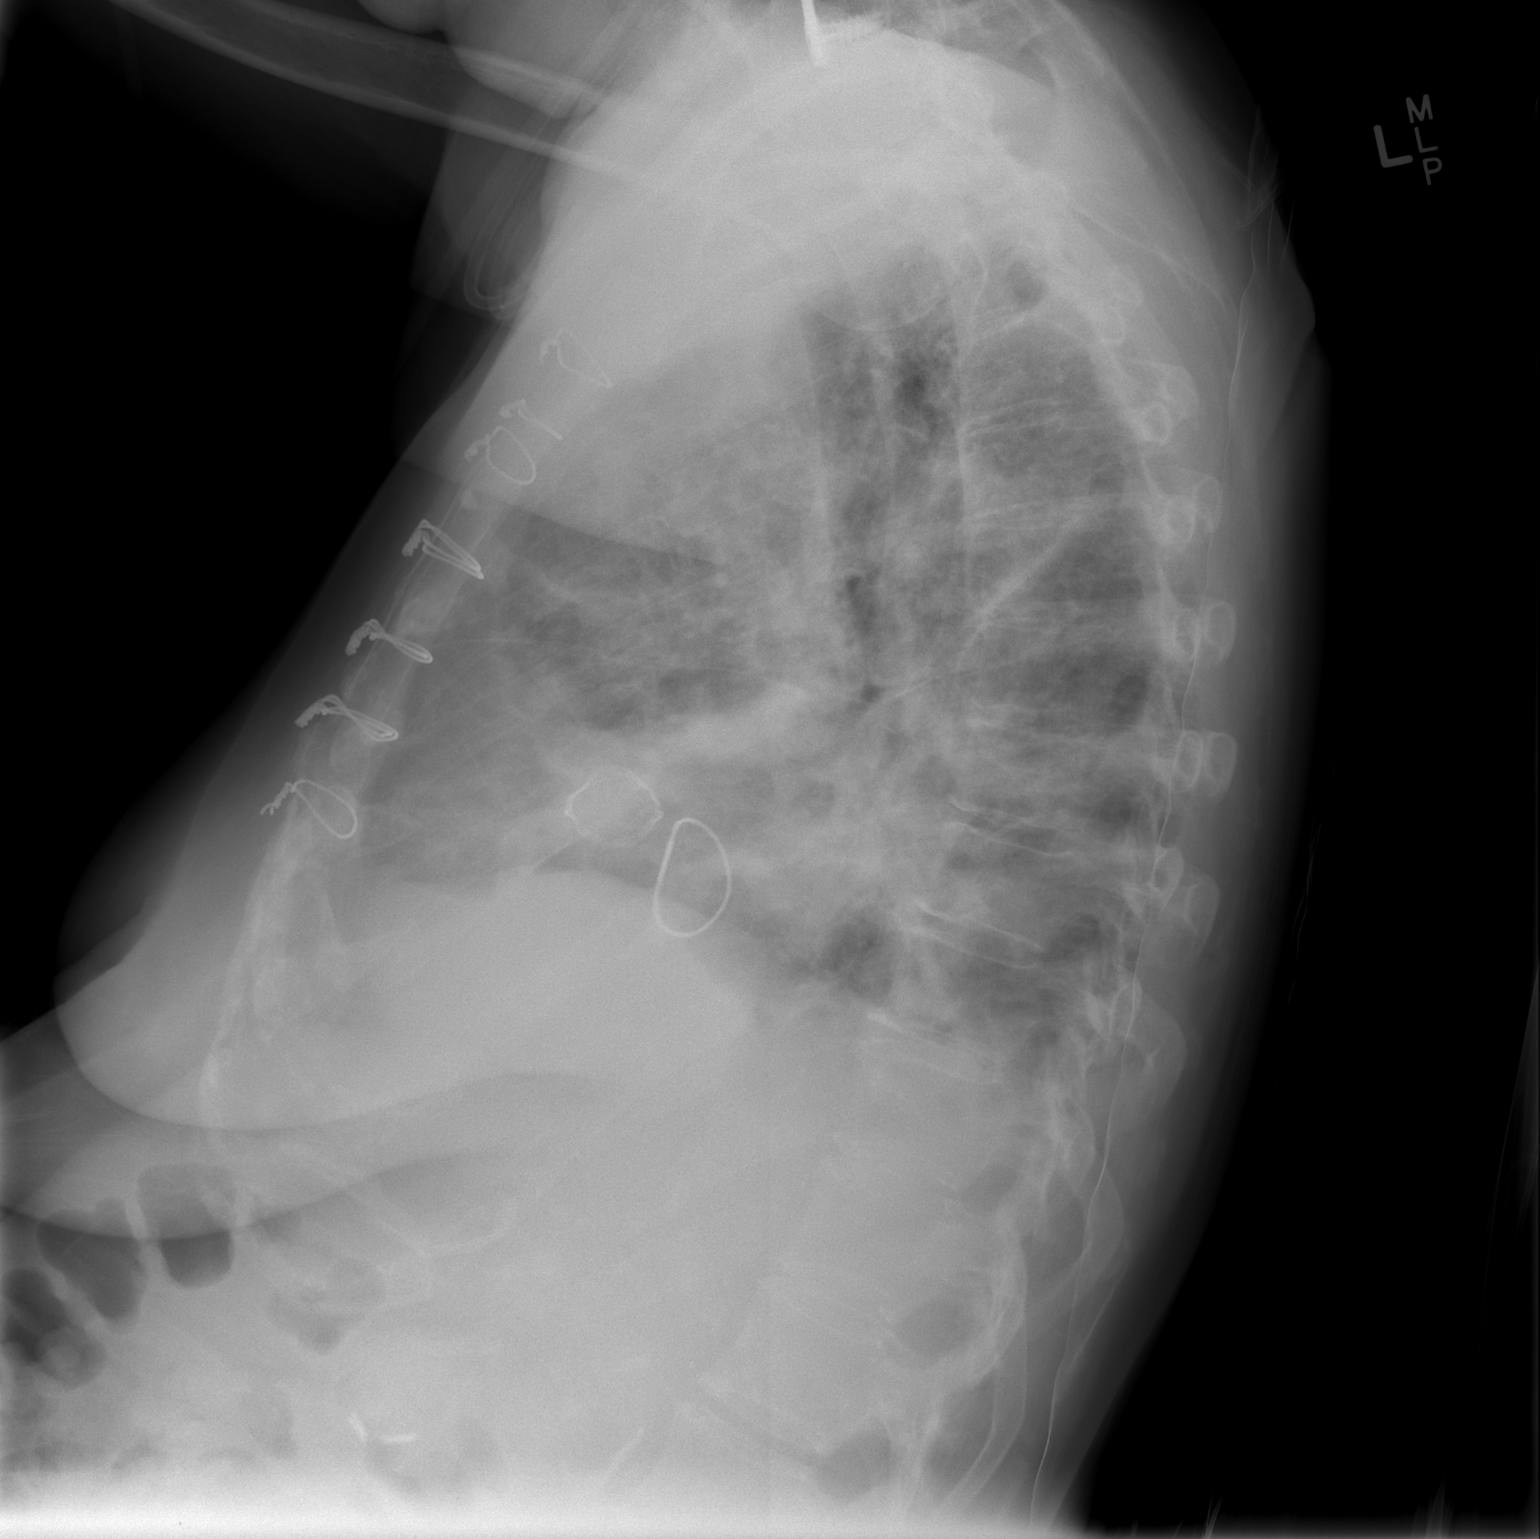

[view not recorded]
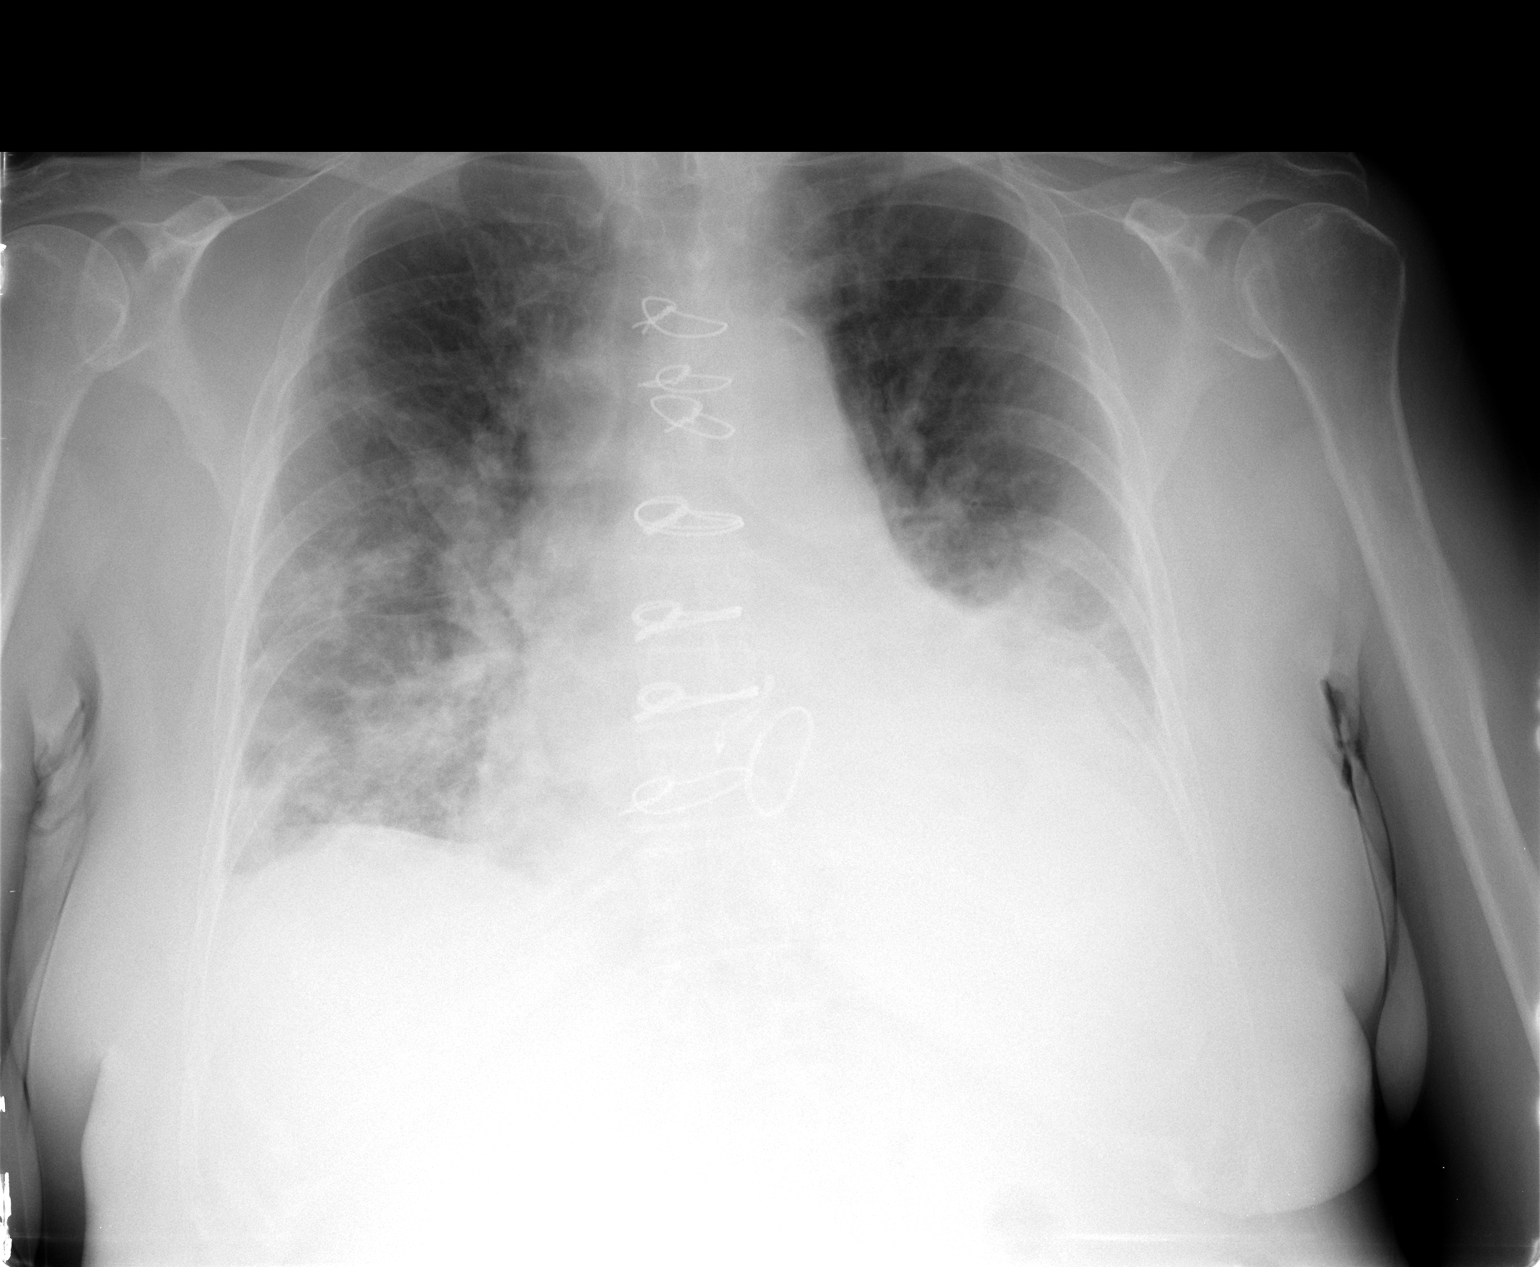

[2 of 2 positions shown; findings below may reference images not displayed]

FINDINGS: There is enlargement of the cardiac silhouette which is
slightly larger than on the prior study. Ectasia and nonaneurysmal
calcification of the thoracic aorta are seen.  Previous median
sternotomy and valvular replacement procedures have been performed.

There is central pulmonary vascular congestion pattern.  Patchy
infiltrates and / or edema opacities are seen involving the
perihilar regions and mid and lower areas of the lungs. Increased
density in the inferior of lung base is seen with loss of
definition of the left hemidiaphragmatic margin consistent with
atelectasis, infiltrate, and / or left pleural effusion.
Osteopenic appearance of bones.  Changes of degenerative
spondylosis.
IMPRESSION: Enlargement of the cardiac silhouette which is slightly larger than
previously. Central vascular congestion pattern.Patchy infiltrates
and / or edema opacities are seen involving the perihilar regions
and mid and lower areas of the lungs and retrocardiac areas on
lateral image.  These may remove be deflated to edema. I cannot
exclude element of pneumonia.  Increased density in the inferior of
lung base is seen with loss of definition of the left
hemidiaphragmatic margin consistent with atelectasis, infiltrate,
and / or left pleural effusion.

## 2011-07-30 IMAGING — CR DG CHEST 2V
2 series · 2 of 2 positions shown · non-contrast
Comparison: Two-view chest x-rays yesterday, 06/25/2010, and
611/212.

CLINICAL DATA: 2-week history of cough and shortness of breath.

CHEST - 2 VIEW 20 [REDACTED] hours:

[w chest lat]
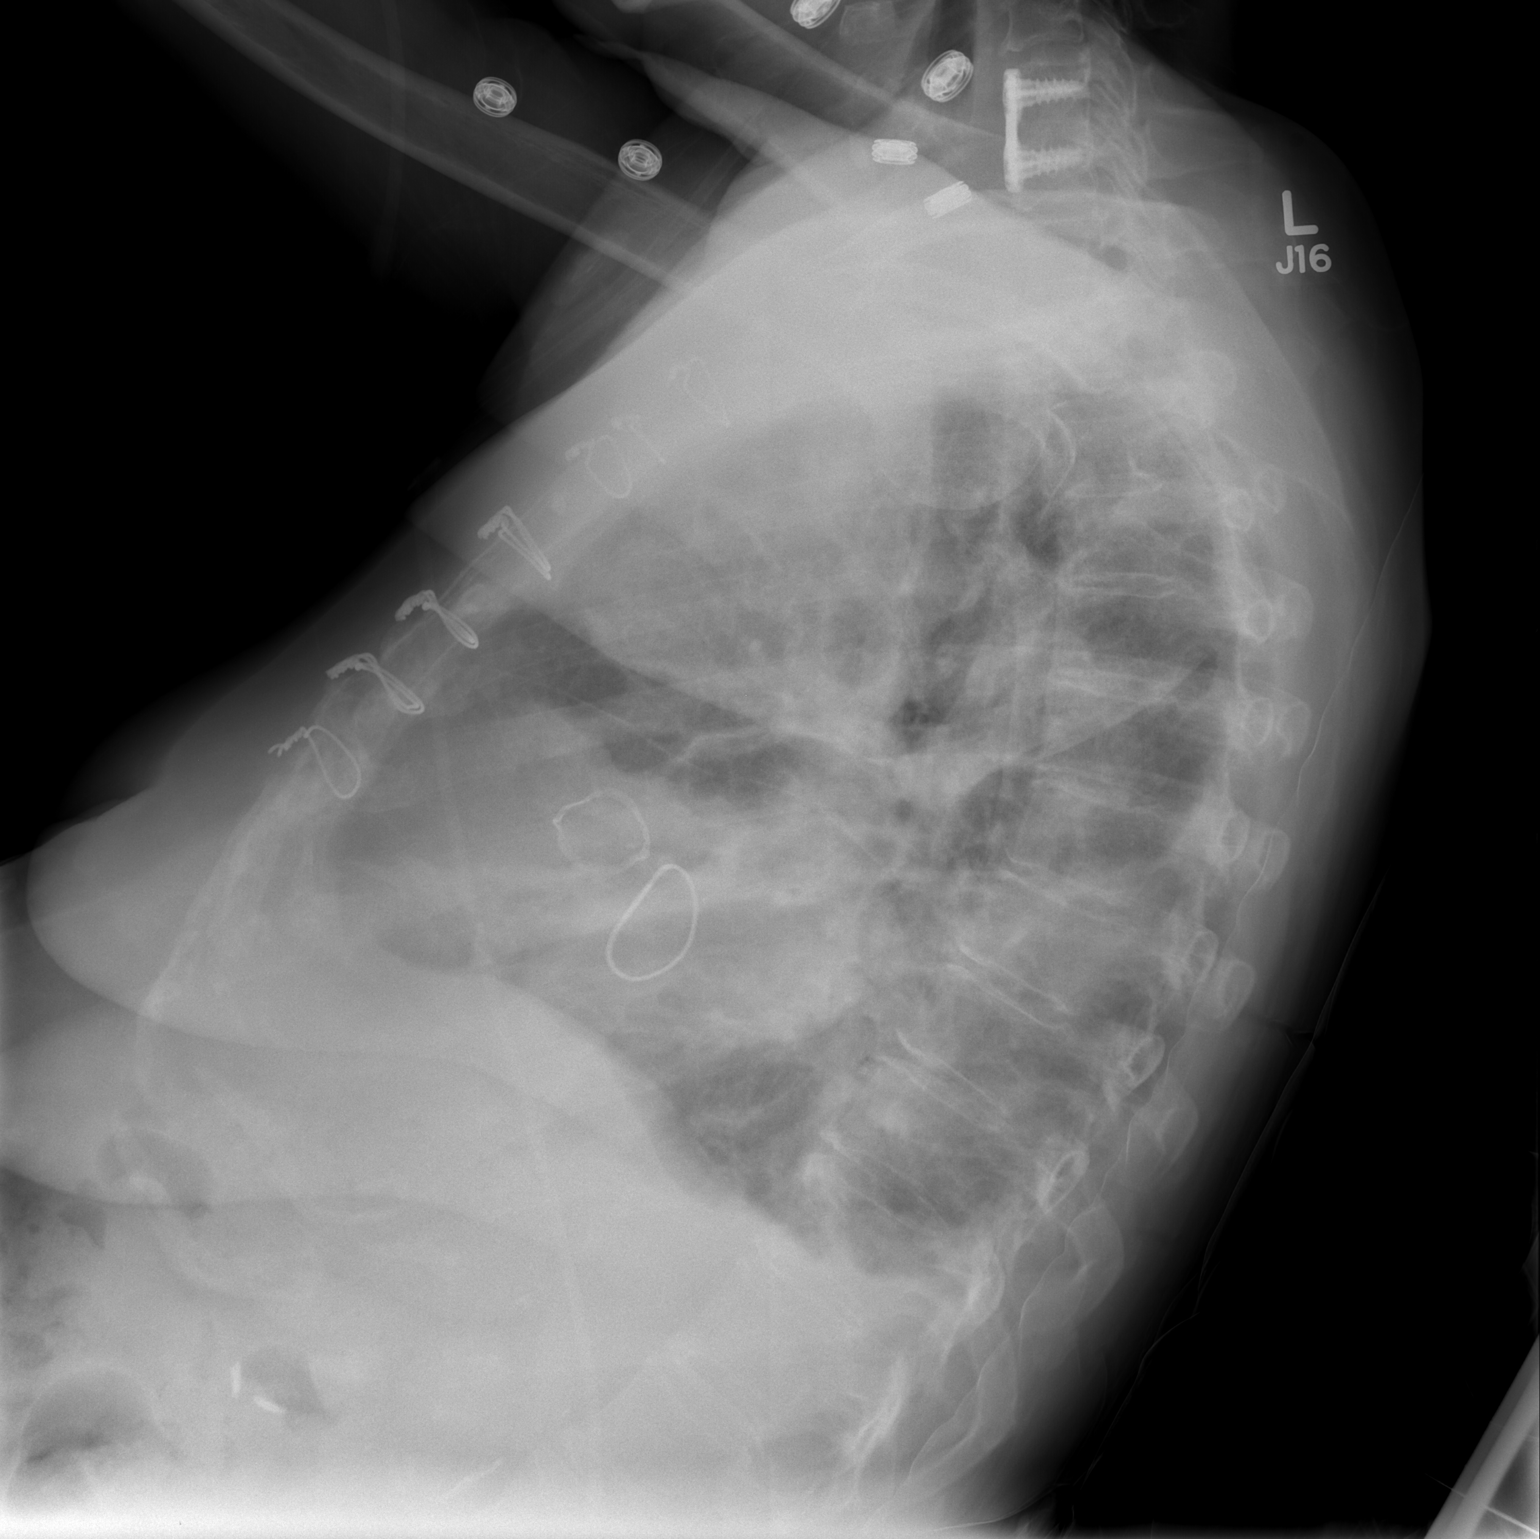

[view not recorded]
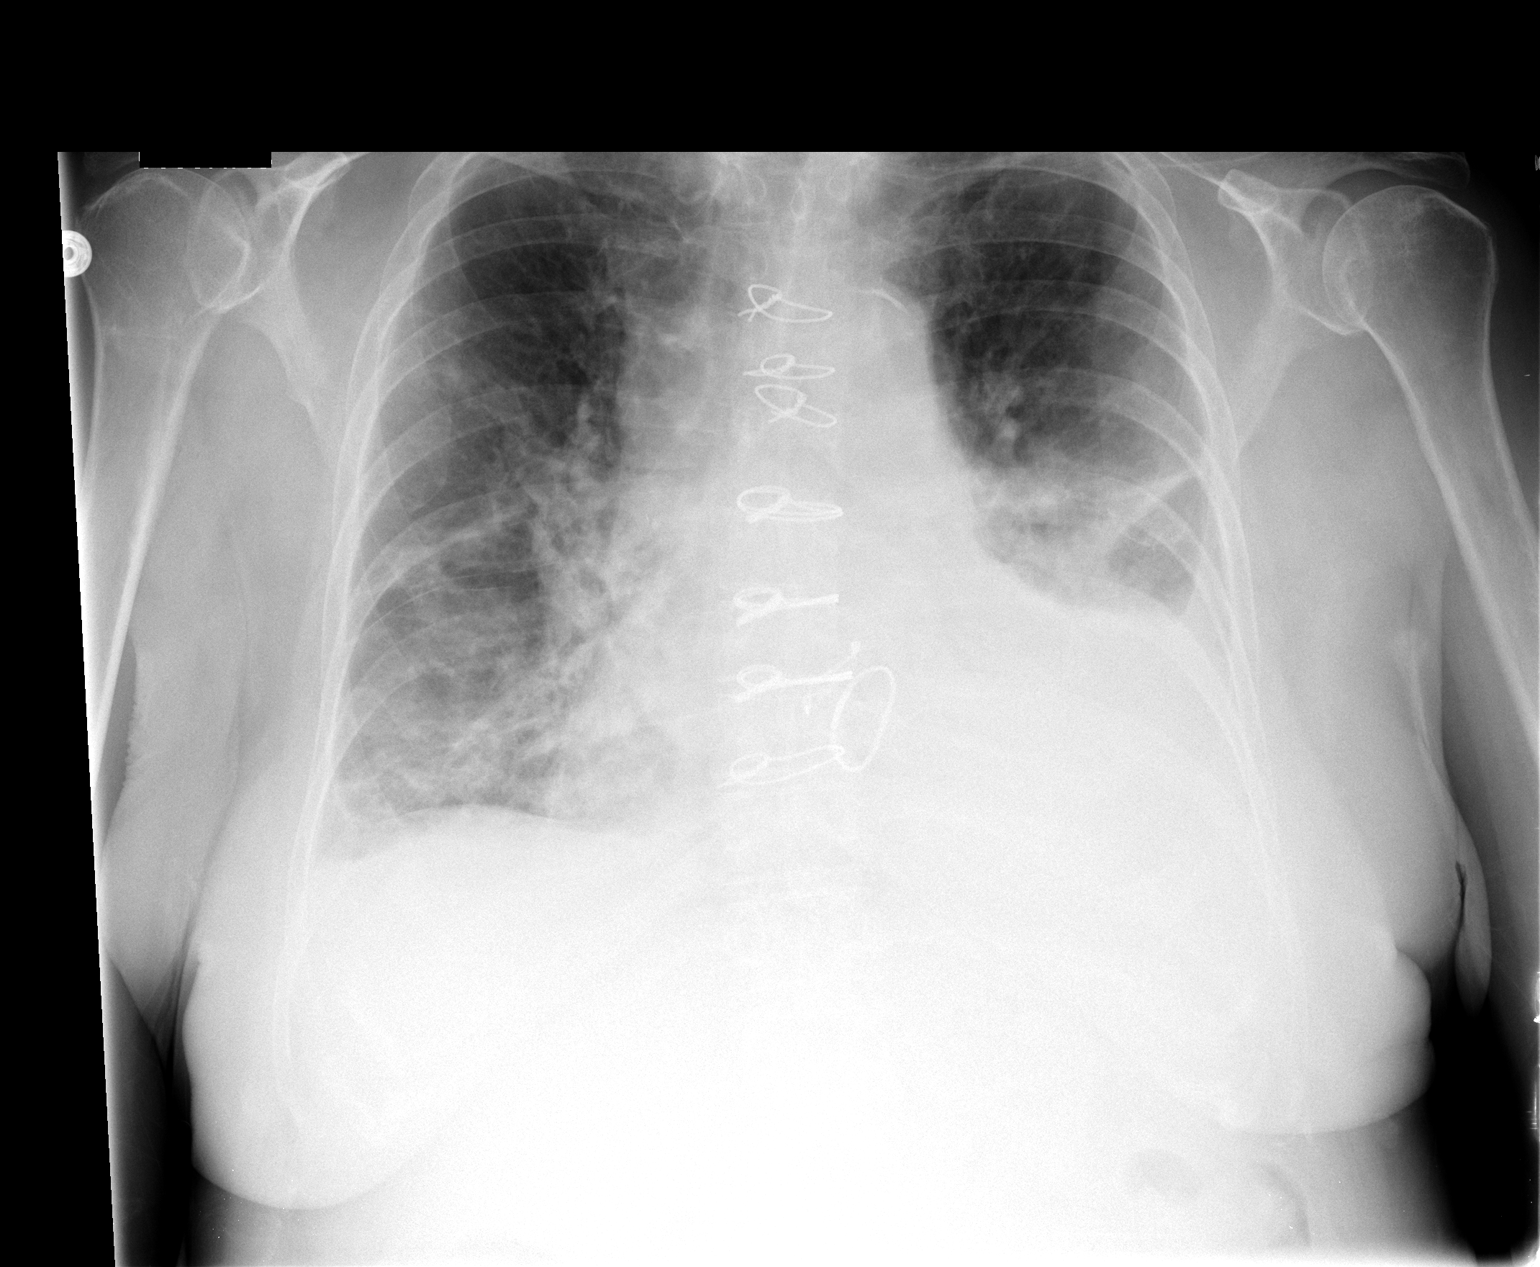

[2 of 2 positions shown; findings below may reference images not displayed]

FINDINGS: Prior sternotomy for aortic and mitral valve
replacements.  Stable marked cardiomegaly.  Stable moderate diffuse
interstitial and airspace pulmonary edema.  Stable bilateral
pleural effusions, left greater than right.  Associated dense
consolidation in the left lower lobe, unchanged.  No new pulmonary
parenchymal abnormalities.  Mild degenerative changes involving the
thoracic spine.
IMPRESSION: Stable CHF and bilateral pleural effusions, left greater than
right.  Stable dense left lower lobe atelectasis and/or pneumonia.
No new abnormalities.

## 2011-09-07 ENCOUNTER — Ambulatory Visit: Payer: Medicare Other | Admitting: Cardiothoracic Surgery

## 2011-11-02 ENCOUNTER — Ambulatory Visit: Payer: Medicare Other | Admitting: Cardiothoracic Surgery

## 2013-07-02 ENCOUNTER — Encounter: Payer: Self-pay | Admitting: *Deleted

## 2014-09-20 ENCOUNTER — Inpatient Hospital Stay (HOSPITAL_COMMUNITY)
Admission: EM | Admit: 2014-09-20 | Discharge: 2014-10-06 | DRG: 268 | Disposition: A | Discharge status: Skilled Nursing Facility | Payer: Medicare Other | Source: Other Acute Inpatient Hospital | Attending: Cardiothoracic Surgery | Admitting: Cardiothoracic Surgery

## 2014-09-20 DIAGNOSIS — I481 Persistent atrial fibrillation: Secondary | ICD-10-CM | POA: Diagnosis present

## 2014-09-20 DIAGNOSIS — E785 Hyperlipidemia, unspecified: Secondary | ICD-10-CM | POA: Diagnosis present

## 2014-09-20 DIAGNOSIS — K5900 Constipation, unspecified: Secondary | ICD-10-CM | POA: Diagnosis not present

## 2014-09-20 DIAGNOSIS — N179 Acute kidney failure, unspecified: Secondary | ICD-10-CM | POA: Diagnosis not present

## 2014-09-20 DIAGNOSIS — I509 Heart failure, unspecified: Secondary | ICD-10-CM | POA: Diagnosis present

## 2014-09-20 DIAGNOSIS — I2609 Other pulmonary embolism with acute cor pulmonale: Secondary | ICD-10-CM | POA: Insufficient documentation

## 2014-09-20 DIAGNOSIS — J9600 Acute respiratory failure, unspecified whether with hypoxia or hypercapnia: Secondary | ICD-10-CM | POA: Diagnosis not present

## 2014-09-20 DIAGNOSIS — I272 Other secondary pulmonary hypertension: Secondary | ICD-10-CM | POA: Diagnosis present

## 2014-09-20 DIAGNOSIS — I719 Aortic aneurysm of unspecified site, without rupture: Secondary | ICD-10-CM | POA: Diagnosis not present

## 2014-09-20 DIAGNOSIS — I959 Hypotension, unspecified: Secondary | ICD-10-CM | POA: Diagnosis not present

## 2014-09-20 DIAGNOSIS — Z953 Presence of xenogenic heart valve: Secondary | ICD-10-CM

## 2014-09-20 DIAGNOSIS — Z7901 Long term (current) use of anticoagulants: Secondary | ICD-10-CM

## 2014-09-20 DIAGNOSIS — I712 Thoracic aortic aneurysm, without rupture: Principal | ICD-10-CM | POA: Diagnosis present

## 2014-09-20 DIAGNOSIS — R079 Chest pain, unspecified: Secondary | ICD-10-CM | POA: Diagnosis present

## 2014-09-20 DIAGNOSIS — I4891 Unspecified atrial fibrillation: Secondary | ICD-10-CM | POA: Diagnosis not present

## 2014-09-20 DIAGNOSIS — D62 Acute posthemorrhagic anemia: Secondary | ICD-10-CM | POA: Diagnosis not present

## 2014-09-20 DIAGNOSIS — I482 Chronic atrial fibrillation: Secondary | ICD-10-CM | POA: Diagnosis present

## 2014-09-20 DIAGNOSIS — Z87891 Personal history of nicotine dependence: Secondary | ICD-10-CM

## 2014-09-20 DIAGNOSIS — I97130 Postprocedural heart failure following cardiac surgery: Secondary | ICD-10-CM

## 2014-09-20 DIAGNOSIS — E119 Type 2 diabetes mellitus without complications: Secondary | ICD-10-CM | POA: Diagnosis present

## 2014-09-20 DIAGNOSIS — J9601 Acute respiratory failure with hypoxia: Secondary | ICD-10-CM | POA: Diagnosis not present

## 2014-09-20 DIAGNOSIS — I10 Essential (primary) hypertension: Secondary | ICD-10-CM | POA: Diagnosis present

## 2014-09-20 DIAGNOSIS — R140 Abdominal distension (gaseous): Secondary | ICD-10-CM

## 2014-09-20 DIAGNOSIS — I716 Thoracoabdominal aortic aneurysm, without rupture: Secondary | ICD-10-CM | POA: Diagnosis not present

## 2014-09-20 DIAGNOSIS — I2721 Secondary pulmonary arterial hypertension: Secondary | ICD-10-CM | POA: Insufficient documentation

## 2014-09-20 DIAGNOSIS — K913 Postprocedural intestinal obstruction: Secondary | ICD-10-CM | POA: Diagnosis not present

## 2014-09-20 DIAGNOSIS — R5381 Other malaise: Secondary | ICD-10-CM | POA: Diagnosis not present

## 2014-09-20 DIAGNOSIS — Z951 Presence of aortocoronary bypass graft: Secondary | ICD-10-CM

## 2014-09-20 DIAGNOSIS — I2781 Cor pulmonale (chronic): Secondary | ICD-10-CM | POA: Diagnosis present

## 2014-09-20 DIAGNOSIS — Z01810 Encounter for preprocedural cardiovascular examination: Secondary | ICD-10-CM | POA: Diagnosis not present

## 2014-09-20 DIAGNOSIS — R0602 Shortness of breath: Secondary | ICD-10-CM

## 2014-09-20 DIAGNOSIS — I480 Paroxysmal atrial fibrillation: Secondary | ICD-10-CM | POA: Diagnosis present

## 2014-09-20 DIAGNOSIS — K551 Chronic vascular disorders of intestine: Secondary | ICD-10-CM

## 2014-09-20 LAB — CBC
HEMATOCRIT: 34.5 % — AB (ref 36.0–46.0)
HEMOGLOBIN: 11 g/dL — AB (ref 12.0–15.0)
MCH: 29.7 pg (ref 26.0–34.0)
MCHC: 31.9 g/dL (ref 30.0–36.0)
MCV: 93.2 fL (ref 78.0–100.0)
Platelets: 214 10*3/uL (ref 150–400)
RBC: 3.7 MIL/uL — AB (ref 3.87–5.11)
RDW: 14.7 % (ref 11.5–15.5)
WBC: 12.6 10*3/uL — ABNORMAL HIGH (ref 4.0–10.5)

## 2014-09-20 LAB — COMPREHENSIVE METABOLIC PANEL
ALBUMIN: 2.6 g/dL — AB (ref 3.5–5.0)
ALK PHOS: 122 U/L (ref 38–126)
ALT: 48 U/L (ref 14–54)
ANION GAP: 9 (ref 5–15)
AST: 64 U/L — ABNORMAL HIGH (ref 15–41)
BILIRUBIN TOTAL: 1 mg/dL (ref 0.3–1.2)
BUN: 21 mg/dL — ABNORMAL HIGH (ref 6–20)
CALCIUM: 8.5 mg/dL — AB (ref 8.9–10.3)
CO2: 27 mmol/L (ref 22–32)
Chloride: 97 mmol/L — ABNORMAL LOW (ref 101–111)
Creatinine, Ser: 1.21 mg/dL — ABNORMAL HIGH (ref 0.44–1.00)
GFR calc non Af Amer: 43 mL/min — ABNORMAL LOW (ref >60)
GFR, EST AFRICAN AMERICAN: 50 mL/min — AB (ref >60)
GLUCOSE: 131 mg/dL — AB (ref 65–99)
POTASSIUM: 4.3 mmol/L (ref 3.5–5.1)
SODIUM: 133 mmol/L — AB (ref 135–145)
TOTAL PROTEIN: 6.4 g/dL — AB (ref 6.5–8.1)

## 2014-09-20 LAB — GLUCOSE, CAPILLARY: GLUCOSE-CAPILLARY: 118 mg/dL — AB (ref 65–99)

## 2014-09-20 LAB — APTT: APTT: 32 s (ref 24–37)

## 2014-09-20 LAB — PROTIME-INR
INR: 1.28 (ref 0.00–1.49)
PROTHROMBIN TIME: 16.2 s — AB (ref 11.6–15.2)

## 2014-09-20 LAB — MRSA PCR SCREENING: MRSA by PCR: NEGATIVE

## 2014-09-20 MED ORDER — MORPHINE SULFATE (PF) 2 MG/ML IV SOLN
2.0000 mg | INTRAVENOUS | Status: DC | PRN
  Administered 2014-09-20 (×2): 2 mg via INTRAVENOUS
  Filled 2014-09-20 (×3): qty 1

## 2014-09-20 MED ORDER — DOCUSATE SODIUM 100 MG PO CAPS
100.0000 mg | ORAL_CAPSULE | Freq: Two times a day (BID) | ORAL | Status: DC
  Administered 2014-09-21 – 2014-09-22 (×3): 100 mg via ORAL
  Filled 2014-09-20 (×3): qty 1

## 2014-09-20 MED ORDER — SODIUM CHLORIDE 0.9 % IV SOLN: 250.0000 mL | INTRAVENOUS | Status: DC | PRN

## 2014-09-20 MED ORDER — ONDANSETRON HCL 4 MG PO TABS: 4.0000 mg | ORAL_TABLET | Freq: Four times a day (QID) | ORAL | Status: DC | PRN

## 2014-09-20 MED ORDER — SENNA 8.6 MG PO TABS
1.0000 | ORAL_TABLET | Freq: Two times a day (BID) | ORAL | Status: DC
  Administered 2014-09-21 – 2014-09-22 (×3): 8.6 mg via ORAL
  Filled 2014-09-20 (×7): qty 1

## 2014-09-20 MED ORDER — SODIUM CHLORIDE 0.9 % IJ SOLN: 3.0000 mL | INTRAMUSCULAR | Status: DC | PRN

## 2014-09-20 MED ORDER — CARVEDILOL 12.5 MG PO TABS
12.5000 mg | ORAL_TABLET | Freq: Two times a day (BID) | ORAL | Status: DC
  Administered 2014-09-20 – 2014-09-21 (×2): 12.5 mg via ORAL
  Filled 2014-09-20 (×4): qty 1

## 2014-09-20 MED ORDER — SODIUM CHLORIDE 0.9 % IJ SOLN
3.0000 mL | Freq: Two times a day (BID) | INTRAMUSCULAR | Status: DC
  Administered 2014-09-21: 3 mL via INTRAVENOUS

## 2014-09-20 MED ORDER — ONDANSETRON HCL 4 MG/2ML IJ SOLN
4.0000 mg | Freq: Four times a day (QID) | INTRAMUSCULAR | Status: DC | PRN
  Administered 2014-09-21: 4 mg via INTRAVENOUS
  Filled 2014-09-20: qty 2

## 2014-09-20 NOTE — H&P (Signed)
[address]       [place] [address]             [phone]      Admission History and Physical   Chief Complaint: chest pain HPI:   The patient is a 74 year old woman with hypertension, dyslipidemia, PAF on coumadin and valvular heart disease who underwent mitral valve repair, MAZE procedure, and AVR with a 19 mm pericardial valve in 04/2010 by Dr. Maren Beach. This was complicated by a very thin and friable aorta with excessive bleeding from the aortic cardioplegia site at the end of surgery requiring cannulation of the femoral artery and removal of the aortic cannula with crosslclamping of the distal ascending aorta and hemashield patch repair of the anterior wall of the aorta. At the conclusion of surgery she had so much edema that her chest was left open and closed with esmark. She was returned to the OR about 3 days later and had her chest closed. She was discharged after a two week stay. She had a follow up CTA of the chest in 09/2010 after presenting with some chest pain but it was unremarkable showing only some small amount of postoperative fluid in the anterior mediastinum. She has done well until the past several weeks when she developed upper substernal chest pain and cough. This has worsened and she was admitted to [facility] in [place] last Thursday. She underwent a cardiac cath there that reportedly showed no coronary disease. 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 Most of the contents were clot but there was a small amount of contrast extravasation from the aorta in the area of the previous patch. There was no pericardial effusion. She requested transfer here for further care. She was on coumadin for atrial fibrillation and this was reversed at the other hospital.  Past Medical  History  Diagnosis Date  . HTN (hypertension)   . Dyslipidemia   . GERD (gastroesophageal reflux disease)   . Mitral regurgitation     on recent TEE 07/07/2010  . DJD (degenerative joint disease), cervical     s/p epidural steroid injections  . Hiatal hernia   . Osteopenia     frax neg 11/10, repeat in 2-4 years DJD C spine Dr. Ethelene Hal s/p epidural steroid injections   . Paroxysmal atrial fibrillation     on coumadin  . Diastolic dysfunction   . Rheumatic aortic stenosis     I will get an echo today to evaluate her murmur  . Heart murmur   . Diverticulitis     Past Surgical History  Procedure Laterality Date  . Total vaginal hysterectomy    . Cholecystectomy    . Left knee surgery    . Bilateral shoulder surgery    . Umbilical hernia repair x2    . Mitral valve repair  04/29/2010    26-mm Edwards annuloplasty ring and posterior commissure plasty, Dr Donata Clay   . Aortic valve replacement  04/29/2010    19 mm supra-annular pericardial tissue valve(serial number [id number]) Dr Donata Clay  . Cox-maze microwave ablation  04/29/2010    left sided, Dr Donata Clay  . Delayed sternal closure  04/30/2010    Dr Donata Clay  . Cardiac catheterization  04/27/2010    Dr Armanda Magic  .  Cardioverted  05/19/2010    Dr Donato Schultz  . C5-c6 fusion    . Rf catheter ablation of afib  11/2009  . US echocardiography  07-2010, 05-04-2011    moderate AS of AVR, mo MR, mild pulm HTN RSVP 50-61mmHg, grade3, 05-04-2011 Echo normal LVF, stable MV ring with mild MS, mild LAE, mild PR, mod TRmoderate pulmonary HTN, prosthetic AVR with mild AS, grade 2 diastolic dysfunction    Family History  Problem Relation Age of Onset  . Diabetes type II    . Coronary artery disease Father     s/p cabg in the 1970's  . Heart disease    . Heart disease Father   . Heart attack Mother   . Hypertension Brother   . Hypertension Sister   . Parkinsonism Sister    Social History:  reports that she has quit smoking. Her  smoking use included Cigarettes. She does not have any smokeless tobacco history on file. She reports that she does not drink alcohol or use illicit drugs.  Allergies:  Allergies  Allergen Reactions  . Codeine Nausea And Vomiting  . Darvocet [Propoxyphene N-Acetaminophen] Nausea And Vomiting  . Iodine Other (See Comments)    Pt does not recall this reaction  . Lisinopril Other (See Comments)    Possible cough    Medications Prior to Admission  Medication Sig Dispense Refill  . alendronate (FOSAMAX) 70 MG tablet Take 70 mg by mouth once a week. On Saturdays  11  . amoxicillin (AMOXIL) 875 MG tablet Take 875 mg by mouth 2 (two) times daily. 10 day course started 09/16/14  0  . Calcium-Magnesium-Vitamin D (CALCIUM 1200+D3 PO) Take 1 tablet by mouth daily.    . carvedilol (COREG) 25 MG tablet Take 25 mg by mouth 2 (two) times daily with a meal.   6  . furosemide (LASIX) 40 MG tablet Take 40 mg by mouth See admin instructions. Take 1 tablet (40 mg) by mouth with breakfast and supper, may take an additional tablet (40 mg) at bedtime as needed for leg swelling  6  . lisinopril (PRINIVIL,ZESTRIL) 40 MG tablet Take 40 mg by mouth daily with supper.   11  . pantoprazole (PROTONIX) 40 MG tablet Take 40 mg by mouth daily.      . potassium chloride SA (K-DUR,KLOR-CON) 20 MEQ tablet Take 20 mEq by mouth daily with supper.    . pravastatin (PRAVACHOL) 20 MG tablet Take 20 mg by mouth daily with supper.   11  . acetaminophen (TYLENOL) 325 MG tablet Take 650 mg by mouth every 6 (six) hours as needed.      Marland Kitchen CALCIUM CARBONATE-VIT D-MIN PO Take by mouth daily.    . cloNIDine HCl (KAPVAY) 0.1 MG TB12 ER tablet Take 0.1 mg by mouth 2 (two) times daily.      . ferrous sulfate 325 (65 FE) MG EC tablet Take 325 mg by mouth 2 (two) times daily with a meal.      . warfarin (COUMADIN) 2.5 MG tablet Take 2.5-3.75 mg by mouth daily with supper. Take 1 tablet (2.5 mg) by mouth on 1st day, then take 1 1/2 tablets (3.75  mg) on 2nd day, then repeat      Results for orders placed or performed during the hospital encounter of 09/20/14 (from the past 48 hour(s))  Glucose, capillary     Status: Abnormal   Collection Time: 09/20/14  4:06 PM  Result Value Ref Range   Glucose-Capillary 118 (H)  65 - 99 mg/dL   Comment 1 Notify RN    No results found.  Review of Systems  Constitutional: Negative for fever, chills, weight loss, malaise/fatigue and diaphoresis.  HENT: Negative.   Eyes: Negative.   Respiratory: Positive for shortness of breath. Negative for cough and hemoptysis.   Cardiovascular: Positive for chest pain. Negative for palpitations, orthopnea, leg swelling and PND.  Gastrointestinal: Negative.   Genitourinary: Negative.   Musculoskeletal: Negative.   Skin: Negative.   Neurological: Negative.   Endo/Heme/Allergies: Negative.   Psychiatric/Behavioral: Negative.     Temperature 98.4 F (36.9 C), height  (1.6 m), weight 78.1 kg (172 lb 2.9 oz). Physical Exam  Constitutional: She is oriented to person, place, and time. She appears well-developed and well-nourished. No distress.  HENT:  Head: Normocephalic and atraumatic.  Mouth/Throat: Oropharynx is clear and moist.  Eyes: EOM are normal. Pupils are equal, round, and reactive to light.  Neck: Normal range of motion. Neck supple. No JVD present. No thyromegaly present.  Cardiovascular: Normal rate, regular rhythm, normal heart sounds and intact distal pulses.   1/6 systolic murmur along the RSB  Respiratory: Effort normal and breath sounds normal. No respiratory distress. She has no wheezes. She has no rales. She exhibits no tenderness.  Well healed sternotomy scar  GI: Soft. Bowel sounds are normal. She exhibits no distension and no mass. There is no tenderness.  Musculoskeletal: Normal range of motion. She exhibits no edema or tenderness.  Lymphadenopathy:    She has no cervical adenopathy.  Neurological: She is alert and oriented to  person, place, and time. She has normal strength. No cranial nerve deficit or sensory deficit.  Skin: Skin is warm and dry.  Psychiatric: She has a normal mood and affect.     Assessment/Plan  I have personally reviewed her CTA from [facility]. 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 Her aorta was very thin and friable at her initial surgery. This is most likely the cause of her chest pain and cough. Her coronaries were reportedly normal but a copy of her cath films was not sent with her. We will need to try to get a copy of the cath and she will need an echo to evaluate her valves and ventricular function. She has been admitted to the ICU for close monitoring. Her blood pressure has been on the low side which is probably good at this point. Her cough will be supressed. Dr. Maren Beach will review her CTA and decide on the best course of action.  Alleen Borne 09/20/2014, 5:35 PM

## 2014-09-21 ENCOUNTER — Inpatient Hospital Stay (HOSPITAL_COMMUNITY): Payer: Medicare Other

## 2014-09-21 LAB — PREPARE RBC (CROSSMATCH)

## 2014-09-21 MED ORDER — HYDROCOD POLST-CPM POLST ER 10-8 MG/5ML PO SUER
5.0000 mL | Freq: Three times a day (TID) | ORAL | Status: DC
  Administered 2014-09-21: 5 mL via ORAL
  Filled 2014-09-21: qty 5

## 2014-09-21 MED ORDER — PROMETHAZINE HCL 25 MG/ML IJ SOLN
12.5000 mg | Freq: Four times a day (QID) | INTRAMUSCULAR | Status: DC | PRN
  Administered 2014-09-27: 12.5 mg via INTRAVENOUS
  Filled 2014-09-21 (×3): qty 1

## 2014-09-21 MED ORDER — HYDROCODONE-ACETAMINOPHEN 5-325 MG PO TABS
1.0000 | ORAL_TABLET | ORAL | Status: DC | PRN
  Administered 2014-09-21: 2 via ORAL
  Filled 2014-09-21 (×2): qty 2

## 2014-09-21 MED ORDER — ALBUMIN HUMAN 5 % IV SOLN
12.5000 g | Freq: Once | INTRAVENOUS | Status: AC
  Administered 2014-09-21: 12.5 g via INTRAVENOUS
  Filled 2014-09-21: qty 250

## 2014-09-21 MED ORDER — HYDROCOD POLST-CPM POLST ER 10-8 MG/5ML PO SUER
5.0000 mL | Freq: Once | ORAL | Status: AC
Start: 2014-09-21 — End: 2014-09-21
  Administered 2014-09-21: 5 mL via ORAL
  Filled 2014-09-21: qty 5

## 2014-09-21 MED ORDER — CETYLPYRIDINIUM CHLORIDE 0.05 % MT LIQD
7.0000 mL | Freq: Two times a day (BID) | OROMUCOSAL | Status: DC
  Administered 2014-09-21 – 2014-09-22 (×2): 7 mL via OROMUCOSAL

## 2014-09-21 NOTE — Progress Notes (Signed)
  Subjective:  Chest pain is better since cough suppression  Objective: Vital signs in last 24 hours: Temp:  [97.4 F (36.3 C)-98.5 F (36.9 C)] 97.4 F (36.3 C) (09/11 1257) Pulse Rate:  [66-125] 123 (09/11 0850) Cardiac Rhythm:  [-] Atrial fibrillation (09/10 2129) Resp:  [15-22] 21 (09/11 0700) BP: (76-118)/(34-86) 93/70 mmHg (09/11 0850) SpO2:  [97 %-100 %] 97 % (09/11 0700) Weight:  [77.8 kg (171 lb 8.3 oz)] 77.8 kg (171 lb 8.3 oz) (09/11 0700)  Hemodynamic parameters for last 24 hours:    Intake/Output from previous day: 09/10 0701 - 09/11 0700 In: 330 [P.O.:330] Out: 500 [Urine:500] Intake/Output this shift: Total I/O In: -  Out: 250 [Urine:250]  General appearance: alert and cooperative Neurologic: intact Heart: regular rate and rhythm Lungs: clear to auscultation bilaterally Abdomen: soft, non-tender; bowel sounds normal; no masses,  no organomegaly Extremities: extremities normal, atraumatic, no cyanosis or edema  Lab Results:  Recent Labs  09/20/14 2000  WBC 12.6*  HGB 11.0*  HCT 34.5*  PLT 214   BMET:  Recent Labs  09/20/14 2000  NA 133*  K 4.3  CL 97*  CO2 27  GLUCOSE 131*  BUN 21*  CREATININE 1.21*  CALCIUM 8.5*    PT/INR:  Recent Labs  09/20/14 2000  LABPROT 16.2*  INR 1.28   ABG    Component Value Date/Time   PHART 7.470* 05/04/2010 0352   HCO3 31.2* 05/04/2010 0352   TCO2 33 05/04/2010 1728   ACIDBASEDEF 5.0* 04/29/2010 2016   O2SAT 89.1 05/04/2010 0352   CBG (last 3)   Recent Labs  09/20/14 1606  GLUCAP 118*    Assessment/Plan:  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 Her BP stays on the low side. Dr. Maren Beach saw her today and will make surgical plans.   LOS: 1 day    Alleen Borne 09/21/2014

## 2014-09-22 ENCOUNTER — Other Ambulatory Visit: Payer: Self-pay | Admitting: *Deleted

## 2014-09-22 ENCOUNTER — Inpatient Hospital Stay (HOSPITAL_COMMUNITY): Payer: Medicare Other

## 2014-09-22 DIAGNOSIS — I719 Aortic aneurysm of unspecified site, without rupture: Secondary | ICD-10-CM

## 2014-09-22 DIAGNOSIS — Z01810 Encounter for preprocedural cardiovascular examination: Secondary | ICD-10-CM

## 2014-09-22 DIAGNOSIS — I712 Thoracic aortic aneurysm, without rupture: Secondary | ICD-10-CM

## 2014-09-22 LAB — URINALYSIS, ROUTINE W REFLEX MICROSCOPIC
Glucose, UA: NEGATIVE mg/dL
Hgb urine dipstick: NEGATIVE
Ketones, ur: NEGATIVE mg/dL
Nitrite: NEGATIVE
Protein, ur: NEGATIVE mg/dL
Specific Gravity, Urine: 1.02 (ref 1.005–1.030)
Urobilinogen, UA: 1 mg/dL (ref 0.0–1.0)
pH: 5 (ref 5.0–8.0)

## 2014-09-22 LAB — SPIROMETRY WITH GRAPH
FEF 25-75 Pre: 0.35 L/sec
FEF2575-%Pred-Pre: 21 %
FEV1-%Change-Post: -15 %
FEV1-%Pred-Post: 26 %
FEV1-%Pred-Pre: 31 %
FEV1-Post: 0.54 L
FEV1-Pre: 0.64 L
FEV1FVC-%Change-Post: 23 %
FEV1FVC-%Pred-Pre: 90 %
FEV6-%Change-Post: -31 %
FEV6-%Pred-Post: 25 %
FEV6-%Pred-Pre: 36 %
FEV6-Post: 0.65 L
FEV6-Pre: 0.94 L
FEV6FVC-%Pred-Post: 105 %
FEV6FVC-%Pred-Pre: 105 %
FVC-%Change-Post: -31 %
FVC-%Pred-Post: 23 %
FVC-%Pred-Pre: 34 %
FVC-Post: 0.65 L
FVC-Pre: 0.94 L
Post FEV1/FVC ratio: 84 %
Post FEV6/FVC ratio: 100 %
Pre FEV1/FVC ratio: 68 %
Pre FEV6/FVC Ratio: 100 %

## 2014-09-22 LAB — COMPREHENSIVE METABOLIC PANEL
ALT: 149 U/L — ABNORMAL HIGH (ref 14–54)
AST: 308 U/L — ABNORMAL HIGH (ref 15–41)
Albumin: 2.6 g/dL — ABNORMAL LOW (ref 3.5–5.0)
Alkaline Phosphatase: 147 U/L — ABNORMAL HIGH (ref 38–126)
Anion gap: 8 (ref 5–15)
BUN: 36 mg/dL — ABNORMAL HIGH (ref 6–20)
CO2: 26 mmol/L (ref 22–32)
Calcium: 8.3 mg/dL — ABNORMAL LOW (ref 8.9–10.3)
Chloride: 98 mmol/L — ABNORMAL LOW (ref 101–111)
Creatinine, Ser: 2.08 mg/dL — ABNORMAL HIGH (ref 0.44–1.00)
GFR calc Af Amer: 26 mL/min — ABNORMAL LOW (ref >60)
GFR calc non Af Amer: 22 mL/min — ABNORMAL LOW (ref >60)
Glucose, Bld: 138 mg/dL — ABNORMAL HIGH (ref 65–99)
Potassium: 4.8 mmol/L (ref 3.5–5.1)
Sodium: 132 mmol/L — ABNORMAL LOW (ref 135–145)
Total Bilirubin: 0.5 mg/dL (ref 0.3–1.2)
Total Protein: 6.1 g/dL — ABNORMAL LOW (ref 6.5–8.1)

## 2014-09-22 LAB — CBC
HCT: 33.8 % — ABNORMAL LOW (ref 36.0–46.0)
Hemoglobin: 10.5 g/dL — ABNORMAL LOW (ref 12.0–15.0)
MCH: 29.2 pg (ref 26.0–34.0)
MCHC: 31.1 g/dL (ref 30.0–36.0)
MCV: 93.9 fL (ref 78.0–100.0)
Platelets: 202 10*3/uL (ref 150–400)
RBC: 3.6 MIL/uL — ABNORMAL LOW (ref 3.87–5.11)
RDW: 14.9 % (ref 11.5–15.5)
WBC: 10.3 10*3/uL (ref 4.0–10.5)

## 2014-09-22 LAB — BASIC METABOLIC PANEL
Anion gap: 11 (ref 5–15)
BUN: 40 mg/dL — ABNORMAL HIGH (ref 6–20)
CO2: 23 mmol/L (ref 22–32)
Calcium: 8.5 mg/dL — ABNORMAL LOW (ref 8.9–10.3)
Chloride: 101 mmol/L (ref 101–111)
Creatinine, Ser: 1.97 mg/dL — ABNORMAL HIGH (ref 0.44–1.00)
GFR calc Af Amer: 28 mL/min — ABNORMAL LOW (ref >60)
GFR calc non Af Amer: 24 mL/min — ABNORMAL LOW (ref >60)
Glucose, Bld: 108 mg/dL — ABNORMAL HIGH (ref 65–99)
Potassium: 4.7 mmol/L (ref 3.5–5.1)
Sodium: 135 mmol/L (ref 135–145)

## 2014-09-22 LAB — URINE MICROSCOPIC-ADD ON

## 2014-09-22 LAB — SURGICAL PCR SCREEN
MRSA, PCR: NEGATIVE
Staphylococcus aureus: NEGATIVE

## 2014-09-22 LAB — CK TOTAL AND CKMB (NOT AT ARMC)
CK, MB: 4.4 ng/mL (ref 0.5–5.0)
Relative Index: 4.4 — ABNORMAL HIGH (ref 0.0–2.5)
Total CK: 101 U/L (ref 38–234)

## 2014-09-22 LAB — PREPARE RBC (CROSSMATCH)

## 2014-09-22 LAB — TROPONIN I: Troponin I: 12.55 ng/mL (ref <0.031)

## 2014-09-22 MED ORDER — CHLORHEXIDINE GLUCONATE CLOTH 2 % EX PADS
6.0000 | MEDICATED_PAD | Freq: Once | CUTANEOUS | Status: AC
  Administered 2014-09-23: 6 via TOPICAL

## 2014-09-22 MED ORDER — ALBUTEROL SULFATE (2.5 MG/3ML) 0.083% IN NEBU
2.5000 mg | INHALATION_SOLUTION | Freq: Once | RESPIRATORY_TRACT | Status: AC
  Administered 2014-09-22: 2.5 mg via RESPIRATORY_TRACT

## 2014-09-22 MED ORDER — ALBUMIN HUMAN 5 % IV SOLN
12.5000 g | Freq: Once | INTRAVENOUS | Status: AC
  Administered 2014-09-22: 12.5 g via INTRAVENOUS
  Filled 2014-09-22: qty 250

## 2014-09-22 MED ORDER — HYDROCOD POLST-CPM POLST ER 10-8 MG/5ML PO SUER: 5.0000 mL | Freq: Three times a day (TID) | ORAL | Status: DC

## 2014-09-22 MED ORDER — PHENYLEPHRINE HCL 10 MG/ML IJ SOLN
30.0000 ug/min | INTRAMUSCULAR | Status: AC
  Administered 2014-09-23: 20 ug/min via INTRAVENOUS
  Filled 2014-09-22: qty 2

## 2014-09-22 MED ORDER — DEXTROSE 5 % IV SOLN
750.0000 mg | INTRAVENOUS | Status: DC
  Filled 2014-09-22: qty 750

## 2014-09-22 MED ORDER — EPINEPHRINE HCL 1 MG/ML IJ SOLN
0.0000 ug/min | INTRAMUSCULAR | Status: AC
  Administered 2014-09-23: 3 ug/min via INTRAVENOUS
  Filled 2014-09-22: qty 4

## 2014-09-22 MED ORDER — SODIUM CHLORIDE 0.9 % IV SOLN
INTRAVENOUS | Status: AC
  Administered 2014-09-23: 1.3 [IU]/h via INTRAVENOUS
  Filled 2014-09-22: qty 2.5

## 2014-09-22 MED ORDER — SODIUM CHLORIDE 0.9 % IV SOLN
INTRAVENOUS | Status: DC
  Administered 2014-09-22: 09:00:00 via INTRAVENOUS

## 2014-09-22 MED ORDER — NITROGLYCERIN IN D5W 200-5 MCG/ML-% IV SOLN
2.0000 ug/min | INTRAVENOUS | Status: DC
  Filled 2014-09-22: qty 250

## 2014-09-22 MED ORDER — MAGNESIUM SULFATE 50 % IJ SOLN
40.0000 meq | INTRAMUSCULAR | Status: DC
  Filled 2014-09-22: qty 10

## 2014-09-22 MED ORDER — HYDROCOD POLST-CPM POLST ER 10-8 MG/5ML PO SUER
5.0000 mL | Freq: Three times a day (TID) | ORAL | Status: DC
  Administered 2014-09-22 (×3): 5 mL via ORAL
  Filled 2014-09-22 (×3): qty 5

## 2014-09-22 MED ORDER — DEXMEDETOMIDINE HCL IN NACL 400 MCG/100ML IV SOLN
0.1000 ug/kg/h | INTRAVENOUS | Status: AC
  Administered 2014-09-23: .2 ug/kg/h via INTRAVENOUS
  Filled 2014-09-22: qty 100

## 2014-09-22 MED ORDER — METOPROLOL TARTRATE 12.5 MG HALF TABLET
12.5000 mg | ORAL_TABLET | Freq: Once | ORAL | Status: AC
  Administered 2014-09-23: 12.5 mg via ORAL
  Filled 2014-09-22: qty 1

## 2014-09-22 MED ORDER — CALCIUM CARBONATE ANTACID 500 MG PO CHEW
1.0000 | CHEWABLE_TABLET | Freq: Two times a day (BID) | ORAL | Status: DC | PRN
  Administered 2014-09-22: 200 mg via ORAL
  Filled 2014-09-22 (×3): qty 1

## 2014-09-22 MED ORDER — POTASSIUM CHLORIDE 2 MEQ/ML IV SOLN
80.0000 meq | INTRAVENOUS | Status: DC
  Filled 2014-09-22: qty 40

## 2014-09-22 MED ORDER — GERHARDT'S BUTT CREAM
TOPICAL_CREAM | Freq: Four times a day (QID) | CUTANEOUS | Status: DC | PRN
  Administered 2014-09-22: 1 via TOPICAL
  Filled 2014-09-22: qty 1

## 2014-09-22 MED ORDER — TEMAZEPAM 15 MG PO CAPS
15.0000 mg | ORAL_CAPSULE | Freq: Once | ORAL | Status: AC | PRN
  Administered 2014-09-22: 15 mg via ORAL
  Filled 2014-09-22: qty 1

## 2014-09-22 MED ORDER — CHLORHEXIDINE GLUCONATE CLOTH 2 % EX PADS
6.0000 | MEDICATED_PAD | Freq: Once | CUTANEOUS | Status: AC
  Administered 2014-09-22: 6 via TOPICAL

## 2014-09-22 MED ORDER — BISACODYL 5 MG PO TBEC: 5.0000 mg | DELAYED_RELEASE_TABLET | Freq: Once | ORAL | Status: DC

## 2014-09-22 MED ORDER — DOPAMINE-DEXTROSE 3.2-5 MG/ML-% IV SOLN
0.0000 ug/kg/min | INTRAVENOUS | Status: DC
  Filled 2014-09-22: qty 250

## 2014-09-22 MED ORDER — HYDROCOD POLST-CPM POLST ER 10-8 MG/5ML PO SUER
5.0000 mL | Freq: Three times a day (TID) | ORAL | Status: DC
  Administered 2014-09-22 – 2014-09-23 (×2): 5 mL via ORAL
  Filled 2014-09-22 (×2): qty 5

## 2014-09-22 MED ORDER — PLASMA-LYTE 148 IV SOLN
INTRAVENOUS | Status: AC
  Administered 2014-09-23: 500 mL
  Filled 2014-09-22: qty 2.5

## 2014-09-22 MED ORDER — VANCOMYCIN HCL 10 G IV SOLR
1250.0000 mg | INTRAVENOUS | Status: AC
  Administered 2014-09-23: 1250 mg via INTRAVENOUS
  Filled 2014-09-22: qty 1250

## 2014-09-22 MED ORDER — SODIUM CHLORIDE 0.9 % IV SOLN
INTRAVENOUS | Status: AC
  Administered 2014-09-23: 70 mL/h via INTRAVENOUS
  Filled 2014-09-22: qty 40

## 2014-09-22 MED ORDER — DEXTROSE 5 % IV SOLN
1.5000 g | INTRAVENOUS | Status: AC
  Administered 2014-09-23: 1.5 g via INTRAVENOUS
  Administered 2014-09-23: .75 g via INTRAVENOUS
  Filled 2014-09-22: qty 1.5

## 2014-09-22 MED ORDER — SODIUM CHLORIDE 0.9 % IV SOLN
INTRAVENOUS | Status: DC
  Filled 2014-09-22: qty 30

## 2014-09-22 NOTE — Progress Notes (Signed)
VASCULAR LAB PRELIMINARY  PRELIMINARY  PRELIMINARY  PRELIMINARY  Pre-op Cardiac Surgery  Carotid Findings:  Bilateral:  1-39% ICA stenosis.  Vertebral artery flow is antegrade.     Upper Extremity Right Left  Brachial Pressures 112 Triphasic 120 triphasic  Radial Waveforms Triphasic Triphasic  Ulnar Waveforms Triphasic Triphasic  Palmar Arch (Allen's Test) Normal Abnormal   Findings:  Doppler waveforms on the right remained normal with both radial and ulnar compressions. Doppler waveforms on the left remained normal with radial compression and diminished greater than 50% with ulnar compression.    Lower  Extremity Right Left  Dorsalis Pedis    Anterior Tibial    Posterior Tibial    Ankle/Brachial Indices      Findings:  Pedal pulses were palpable bilaterally at rest.   Ashley [name], RVS 09/22/2014, 6:11 PM

## 2014-09-22 NOTE — Progress Notes (Signed)
RT attempted blood gas, obtaining venous blood by 2 RT's, MD notified and stated that ABG could be drawn tomorrow AM after Aline was placed. RN aware.

## 2014-09-22 NOTE — Progress Notes (Signed)
CRITICAL VALUE ALERT  Critical value received:  Troponin 12.55  Date of notification:  09/22/14  Time of notification:  1730  Critical value read back:Yes.    Nurse who received alert:  Sandy Salaam RN  MD notified (1st page):  Donata Clay  Time of first page:  1731  MD notified (2nd page):  Time of second page:  Responding MD:  Donata Clay  Time MD responded:  [phone]

## 2014-09-22 NOTE — Progress Notes (Signed)
  Echocardiogram 2D Echocardiogram has been performed.  Leta Jungling M 09/22/2014, 1:34 PM

## 2014-09-22 NOTE — Progress Notes (Signed)
Procedure(s) (LRB): REDO STERNOTOMY (N/A) REPLACEMENT ASCENDING AORTA (N/A) TRANSESOPHAGEAL ECHOCARDIOGRAM (TEE) (N/A) Subjective: Patient examined, echocardiogram performed today and CTA of chest performed 3 days ago reviewed. Situation discussed with the patient's daughter in the ICU.  Very nice 74 year old Caucasian female underwent aortic valve replacement, mitral valve repair and left sided maze procedure was 5 years ago. He was found at the time of surgery her ascending aorta was very friable and required a Dacron patch at the cardioplegia catheter site because the usual pursing sutures tore through the aorta. She has done well until the past few weeks when she has developed some left neck and shoulder discomfort, pressure in her chest, and a cough. She has had reduced exercise tolerance. She was admitted to [facility] to rule out MI. Cardiac catheterization however demonstrated clean coronaries. Echocardiogram demonstrated normal LV function but with RV dilatation and moderate tricuspid regurgitation. The mitral valve repair and aortic valve replacement were secure and intact. No pericardial effusion is noted. The patient has had persistent atrial fibrillation and was on Coumadin with an elevated INR at the time of her presentation. This was reversed with vitamin K. 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 There was an apparent small leak of contrast from the surface of the anterior ascending aorta and from the area of the Dacron patch repair, the aortotomy closure, or the cardiopulmonary bypass aortic cannulation stitch. The patient's creatinine has increased following dye load from a cardiac cath and CTA. LFTs have increased problems related to her RV dysfunction and tricuspid regurgitation noted on echocardiogram at this hospital.  I  reviewed the CT scans with the team that performs stent graft of the thoracic aorta, Dr. Tyrone Sage, feels that a stent graft would not be safe in this situation because of very small area of deployment. I recommended to the patient we proceed with redo sternotomy under hypothermic circulatory arrest to replace her ascending aorta with either Dacron graft or homograft. This is a high risk procedure, as she understands, however it is best chance for long-term survival. I discussed the major details of the surgery with both the patient and her daughter and the expected postoperative recovery as well as the potential postoperative risks of renal failure, stroke, infection, stroke, and death. We will proceed in the morning because of her worsening symptoms of left chest and shoulder pain and a significant cough probably from the pressure of the pseudoaneurysm.  Objective: Vital signs in last 24 hours: Temp:  [97.9 F (36.6 C)-98.3 F (36.8 C)] 98.2 F (36.8 C) (09/12 1100) Pulse Rate:  [47-236] 138 (09/12 1300) Cardiac Rhythm:  [-] Atrial fibrillation (09/12 1200) Resp:  [16-27] 22 (09/12 1300) BP: (71-108)/(48-90) 108/82 mmHg (09/12 1300) SpO2:  [95 %-100 %] 97 % (09/12 1300) Weight:  [174 lb 13.2 oz (79.3 kg)] 174 lb 13.2 oz (79.3 kg) (09/12 0630)  Hemodynamic parameters for last 24 hours:   atrial fibrillation rate 110-120  Intake/Output from previous day: 09/11 0701 - 09/12 0700 In: 960 [P.O.:960] Out: 651 [Urine:650; Stool:1] Intake/Output this shift: Total I/O In: 425 [P.O.:60; I.V.:115; IV Piggyback:250] Out: -       Physical Exam  General: Very nice middle-aged Caucasian female no acute distress HEENT: Normocephalic pupils equal , dentition adequate Neck: Supple without JVD, adenopathy, or bruit Chest: Clear to auscultation, symmetrical breath sounds, no rhonchi,  no tenderness. Well-healed sternotomy incision              Cardiovascular: Regular rate and rhythm, no murmur, no  gallop, peripheral pulses             palpable in all extremities Abdomen:  Soft, nontender, no palpable mass or organomegaly Extremities: Warm, well-perfused, no clubbing cyanosis edema or tenderness,              no venous stasis changes of the legs Rectal/GU: Deferred Neuro: Grossly non--focal and symmetrical throughout Skin: Clean and dry without rash or ulceration   Lab Results:  Recent Labs  09/20/14 2000 09/22/14 0216  WBC 12.6* 10.3  HGB 11.0* 10.5*  HCT 34.5* 33.8*  PLT 214 202   BMET:  Recent Labs  09/20/14 2000 09/22/14 0216  NA 133* 132*  K 4.3 4.8  CL 97* 98*  CO2 27 26  GLUCOSE 131* 138*  BUN 21* 36*  CREATININE 1.21* 2.08*  CALCIUM 8.5* 8.3*    PT/INR:  Recent Labs  09/20/14 2000  LABPROT 16.2*  INR 1.28   ABG    Component Value Date/Time   PHART 7.470* 05/04/2010 0352   HCO3 31.2* 05/04/2010 0352   TCO2 33 05/04/2010 1728   ACIDBASEDEF 5.0* 04/29/2010 2016   O2SAT 89.1 05/04/2010 0352   CBG (last 3)   Recent Labs  09/20/14 1606  GLUCAP 118*    Assessment/Plan: S/P Procedure(s) (LRB): REDO STERNOTOMY (N/A) REPLACEMENT ASCENDING AORTA (N/A) TRANSESOPHAGEAL ECHOCARDIOGRAM (TEE) (N/A) Plan 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   LOS: 2 days    Ashley [name] 09/22/2014

## 2014-09-22 NOTE — Care Management Note (Signed)
Case Management Note  Patient Details  Name: SATHVIKA OJO MRN: [id number] Date of Birth: [birth year]  Subjective/Objective:      Patient lives at home alone, independent prior to admission.  Plan for surgery tomorrow.   Daughters, and son plan to be with her 24/7 on discharge.                Action/Plan:   Expected Discharge Date:                  Expected Discharge Plan:  Home/Self Care  In-House Referral:     Discharge planning Services  CM Consult  Post Acute Care Choice:    Choice offered to:     DME Arranged:    DME Agency:     HH Arranged:    HH Agency:     Status of Service:  In process, will continue to follow  Medicare Important Message Given:    Date Medicare IM Given:    Medicare IM give by:    Date Additional Medicare IM Given:    Additional Medicare Important Message give by:     If discussed at Long Length of Stay Meetings, dates discussed:    Additional Comments:  Vangie Bicker, RN 09/22/2014, 3:53 PM

## 2014-09-22 NOTE — Progress Notes (Signed)
EKG CRITICAL VALUE     12 lead EKG performed.  Critical value noted.  Harlene Salts, RN notified.   Kaio Kuhlman, CCT 09/22/2014 3:20 PM

## 2014-09-23 ENCOUNTER — Encounter (HOSPITAL_COMMUNITY)
Admission: EM | Disposition: A | Discharge status: Skilled Nursing Facility | Payer: Medicare Other | Source: Other Acute Inpatient Hospital | Attending: Cardiothoracic Surgery

## 2014-09-23 ENCOUNTER — Inpatient Hospital Stay (HOSPITAL_COMMUNITY): Payer: Medicare Other

## 2014-09-23 ENCOUNTER — Inpatient Hospital Stay (HOSPITAL_COMMUNITY): Payer: Medicare Other | Admitting: Certified Registered"

## 2014-09-23 ENCOUNTER — Encounter (HOSPITAL_COMMUNITY): Payer: Self-pay | Admitting: Anesthesiology

## 2014-09-23 HISTORY — PX: TEE WITHOUT CARDIOVERSION: SHX5443

## 2014-09-23 HISTORY — PX: REPLACEMENT ASCENDING AORTA: SHX6068

## 2014-09-23 LAB — CREATININE, SERUM
Creatinine, Ser: 1.58 mg/dL — ABNORMAL HIGH (ref 0.44–1.00)
GFR calc Af Amer: 36 mL/min — ABNORMAL LOW (ref >60)
GFR calc non Af Amer: 31 mL/min — ABNORMAL LOW (ref >60)

## 2014-09-23 LAB — POCT I-STAT, CHEM 8
BUN: 41 mg/dL — AB (ref 6–20)
BUN: 34 mg/dL — AB (ref 6–20)
BUN: 36 mg/dL — ABNORMAL HIGH (ref 6–20)
BUN: 33 mg/dL — ABNORMAL HIGH (ref 6–20)
BUN: 37 mg/dL — ABNORMAL HIGH (ref 6–20)
BUN: 32 mg/dL — ABNORMAL HIGH (ref 6–20)
CALCIUM ION: 1.03 mmol/L — AB (ref 1.13–1.30)
CALCIUM ION: 1.11 mmol/L — AB (ref 1.13–1.30)
CALCIUM ION: 1 mmol/L — AB (ref 1.13–1.30)
CALCIUM ION: 1.02 mmol/L — AB (ref 1.13–1.30)
CHLORIDE: 101 mmol/L (ref 101–111)
CHLORIDE: 98 mmol/L — AB (ref 101–111)
CREATININE: 1.4 mg/dL — AB (ref 0.44–1.00)
CREATININE: 1.3 mg/dL — AB (ref 0.44–1.00)
CREATININE: 1.5 mg/dL — AB (ref 0.44–1.00)
CREATININE: 1.4 mg/dL — AB (ref 0.44–1.00)
CREATININE: 1.5 mg/dL — AB (ref 0.44–1.00)
Calcium, Ion: 1.12 mmol/L — ABNORMAL LOW (ref 1.13–1.30)
Calcium, Ion: 0.88 mmol/L — ABNORMAL LOW (ref 1.13–1.30)
Chloride: 99 mmol/L — ABNORMAL LOW (ref 101–111)
Chloride: 99 mmol/L — ABNORMAL LOW (ref 101–111)
Chloride: 98 mmol/L — ABNORMAL LOW (ref 101–111)
Chloride: 101 mmol/L (ref 101–111)
Creatinine, Ser: 1.4 mg/dL — ABNORMAL HIGH (ref 0.44–1.00)
GLUCOSE: 123 mg/dL — AB (ref 65–99)
GLUCOSE: 92 mg/dL (ref 65–99)
GLUCOSE: 93 mg/dL (ref 65–99)
GLUCOSE: 121 mg/dL — AB (ref 65–99)
Glucose, Bld: 113 mg/dL — ABNORMAL HIGH (ref 65–99)
Glucose, Bld: 122 mg/dL — ABNORMAL HIGH (ref 65–99)
HCT: 29 % — ABNORMAL LOW (ref 36.0–46.0)
HCT: 19 % — ABNORMAL LOW (ref 36.0–46.0)
HCT: 26 % — ABNORMAL LOW (ref 36.0–46.0)
HCT: 27 % — ABNORMAL LOW (ref 36.0–46.0)
HEMATOCRIT: 26 % — AB (ref 36.0–46.0)
HEMATOCRIT: 26 % — AB (ref 36.0–46.0)
HEMOGLOBIN: 9.9 g/dL — AB (ref 12.0–15.0)
HEMOGLOBIN: 8.8 g/dL — AB (ref 12.0–15.0)
HEMOGLOBIN: 8.8 g/dL — AB (ref 12.0–15.0)
HEMOGLOBIN: 9.2 g/dL — AB (ref 12.0–15.0)
Hemoglobin: 6.5 g/dL — CL (ref 12.0–15.0)
Hemoglobin: 8.8 g/dL — ABNORMAL LOW (ref 12.0–15.0)
POTASSIUM: 4.2 mmol/L (ref 3.5–5.1)
POTASSIUM: 3.7 mmol/L (ref 3.5–5.1)
Potassium: 3.8 mmol/L (ref 3.5–5.1)
Potassium: 4.1 mmol/L (ref 3.5–5.1)
Potassium: 4.4 mmol/L (ref 3.5–5.1)
Potassium: 4.2 mmol/L (ref 3.5–5.1)
SODIUM: 134 mmol/L — AB (ref 135–145)
SODIUM: 134 mmol/L — AB (ref 135–145)
SODIUM: 136 mmol/L (ref 135–145)
Sodium: 134 mmol/L — ABNORMAL LOW (ref 135–145)
Sodium: 134 mmol/L — ABNORMAL LOW (ref 135–145)
Sodium: 136 mmol/L (ref 135–145)
TCO2: 26 mmol/L (ref 0–100)
TCO2: 24 mmol/L (ref 0–100)
TCO2: 25 mmol/L (ref 0–100)
TCO2: 25 mmol/L (ref 0–100)
TCO2: 25 mmol/L (ref 0–100)
TCO2: 22 mmol/L (ref 0–100)

## 2014-09-23 LAB — GLUCOSE, CAPILLARY
GLUCOSE-CAPILLARY: 117 mg/dL — AB (ref 65–99)
GLUCOSE-CAPILLARY: 110 mg/dL — AB (ref 65–99)
Glucose-Capillary: 118 mg/dL — ABNORMAL HIGH (ref 65–99)
Glucose-Capillary: 120 mg/dL — ABNORMAL HIGH (ref 65–99)
Glucose-Capillary: 124 mg/dL — ABNORMAL HIGH (ref 65–99)
Glucose-Capillary: 118 mg/dL — ABNORMAL HIGH (ref 65–99)
Glucose-Capillary: 103 mg/dL — ABNORMAL HIGH (ref 65–99)

## 2014-09-23 LAB — COMPREHENSIVE METABOLIC PANEL
ALT: 133 U/L — ABNORMAL HIGH (ref 14–54)
AST: 166 U/L — ABNORMAL HIGH (ref 15–41)
Albumin: 3 g/dL — ABNORMAL LOW (ref 3.5–5.0)
Alkaline Phosphatase: 153 U/L — ABNORMAL HIGH (ref 38–126)
Anion gap: 11 (ref 5–15)
BUN: 43 mg/dL — ABNORMAL HIGH (ref 6–20)
CO2: 24 mmol/L (ref 22–32)
Calcium: 8.2 mg/dL — ABNORMAL LOW (ref 8.9–10.3)
Chloride: 98 mmol/L — ABNORMAL LOW (ref 101–111)
Creatinine, Ser: 2.04 mg/dL — ABNORMAL HIGH (ref 0.44–1.00)
GFR calc Af Amer: 26 mL/min — ABNORMAL LOW (ref >60)
GFR calc non Af Amer: 23 mL/min — ABNORMAL LOW (ref >60)
Glucose, Bld: 98 mg/dL (ref 65–99)
Potassium: 4.3 mmol/L (ref 3.5–5.1)
Sodium: 133 mmol/L — ABNORMAL LOW (ref 135–145)
Total Bilirubin: 0.9 mg/dL (ref 0.3–1.2)
Total Protein: 6.2 g/dL — ABNORMAL LOW (ref 6.5–8.1)

## 2014-09-23 LAB — CBC
HCT: 33 % — ABNORMAL LOW (ref 36.0–46.0)
HCT: 26.3 % — ABNORMAL LOW (ref 36.0–46.0)
HCT: 28 % — ABNORMAL LOW (ref 36.0–46.0)
HEMOGLOBIN: 8.6 g/dL — AB (ref 12.0–15.0)
Hemoglobin: 10.4 g/dL — ABNORMAL LOW (ref 12.0–15.0)
Hemoglobin: 9.2 g/dL — ABNORMAL LOW (ref 12.0–15.0)
MCH: 29.5 pg (ref 26.0–34.0)
MCH: 29.8 pg (ref 26.0–34.0)
MCH: 29.7 pg (ref 26.0–34.0)
MCHC: 31.5 g/dL (ref 30.0–36.0)
MCHC: 32.7 g/dL (ref 30.0–36.0)
MCHC: 32.9 g/dL (ref 30.0–36.0)
MCV: 93.8 fL (ref 78.0–100.0)
MCV: 91 fL (ref 78.0–100.0)
MCV: 90.3 fL (ref 78.0–100.0)
Platelets: 184 10*3/uL (ref 150–400)
Platelets: 117 10*3/uL — ABNORMAL LOW (ref 150–400)
Platelets: 139 10*3/uL — ABNORMAL LOW (ref 150–400)
RBC: 3.52 MIL/uL — ABNORMAL LOW (ref 3.87–5.11)
RBC: 2.89 MIL/uL — AB (ref 3.87–5.11)
RBC: 3.1 MIL/uL — ABNORMAL LOW (ref 3.87–5.11)
RDW: 15.1 % (ref 11.5–15.5)
RDW: 14.4 % (ref 11.5–15.5)
RDW: 14.6 % (ref 11.5–15.5)
WBC: 10.1 10*3/uL (ref 4.0–10.5)
WBC: 11.7 10*3/uL — AB (ref 4.0–10.5)
WBC: 12.7 10*3/uL — ABNORMAL HIGH (ref 4.0–10.5)

## 2014-09-23 LAB — POCT I-STAT 3, ART BLOOD GAS (G3+)
ACID-BASE DEFICIT: 1 mmol/L (ref 0.0–2.0)
ACID-BASE DEFICIT: 1 mmol/L (ref 0.0–2.0)
ACID-BASE DEFICIT: 1 mmol/L (ref 0.0–2.0)
ACID-BASE EXCESS: 1 mmol/L (ref 0.0–2.0)
BICARBONATE: 25.6 meq/L — AB (ref 20.0–24.0)
BICARBONATE: 24.1 meq/L — AB (ref 20.0–24.0)
Bicarbonate: 23.6 mEq/L (ref 20.0–24.0)
Bicarbonate: 23.9 mEq/L (ref 20.0–24.0)
O2 SAT: 100 %
O2 SAT: 99 %
O2 Saturation: 100 %
O2 Saturation: 96 %
PCO2 ART: 35.6 mmHg (ref 35.0–45.0)
PH ART: 7.43 (ref 7.350–7.450)
PH ART: 7.391 (ref 7.350–7.450)
PO2 ART: 395 mmHg — AB (ref 80.0–100.0)
TCO2: 25 mmol/L (ref 0–100)
TCO2: 27 mmol/L (ref 0–100)
TCO2: 25 mmol/L (ref 0–100)
TCO2: 25 mmol/L (ref 0–100)
pCO2 arterial: 38.6 mmHg (ref 35.0–45.0)
pCO2 arterial: 38.8 mmHg (ref 35.0–45.0)
pCO2 arterial: 37.7 mmHg (ref 35.0–45.0)
pH, Arterial: 7.429 (ref 7.350–7.450)
pH, Arterial: 7.41 (ref 7.350–7.450)
pO2, Arterial: 327 mmHg — ABNORMAL HIGH (ref 80.0–100.0)
pO2, Arterial: 80 mmHg (ref 80.0–100.0)
pO2, Arterial: 144 mmHg — ABNORMAL HIGH (ref 80.0–100.0)

## 2014-09-23 LAB — GRAM STAIN

## 2014-09-23 LAB — POCT I-STAT 4, (NA,K, GLUC, HGB,HCT)
GLUCOSE: 127 mg/dL — AB (ref 65–99)
HCT: 26 % — ABNORMAL LOW (ref 36.0–46.0)
HEMOGLOBIN: 8.8 g/dL — AB (ref 12.0–15.0)
POTASSIUM: 3.5 mmol/L (ref 3.5–5.1)
Sodium: 136 mmol/L (ref 135–145)

## 2014-09-23 LAB — APTT: APTT: 36 s (ref 24–37)

## 2014-09-23 LAB — PLATELET COUNT: Platelets: 125 10*3/uL — ABNORMAL LOW (ref 150–400)

## 2014-09-23 LAB — HEMOGLOBIN A1C
Hgb A1c MFr Bld: 6.8 % — ABNORMAL HIGH (ref 4.8–5.6)
Mean Plasma Glucose: 148 mg/dL

## 2014-09-23 LAB — HEMOGLOBIN AND HEMATOCRIT, BLOOD
HCT: 24.7 % — ABNORMAL LOW (ref 36.0–46.0)
Hemoglobin: 8 g/dL — ABNORMAL LOW (ref 12.0–15.0)

## 2014-09-23 LAB — MAGNESIUM: Magnesium: 3 mg/dL — ABNORMAL HIGH (ref 1.7–2.4)

## 2014-09-23 LAB — FIBRINOGEN: Fibrinogen: 399 mg/dL (ref 204–475)

## 2014-09-23 SURGERY — REDO STERNOTOMY
Anesthesia: General | Site: Chest

## 2014-09-23 MED ORDER — AMIODARONE HCL IN DEXTROSE 360-4.14 MG/200ML-% IV SOLN
30.0000 mg/h | INTRAVENOUS | Status: DC
  Administered 2014-09-23 – 2014-09-24 (×4): 30 mg/h via INTRAVENOUS
  Filled 2014-09-23 (×7): qty 200

## 2014-09-23 MED ORDER — ASPIRIN 81 MG PO CHEW: 324.0000 mg | CHEWABLE_TABLET | Freq: Every day | ORAL | Status: DC

## 2014-09-23 MED ORDER — LACTATED RINGERS IV SOLN
INTRAVENOUS | Status: DC
  Administered 2014-09-23: 15:00:00 via INTRAVENOUS

## 2014-09-23 MED ORDER — INSULIN ASPART 100 UNIT/ML ~~LOC~~ SOLN
0.0000 [IU] | SUBCUTANEOUS | Status: DC
  Administered 2014-09-24 (×5): 2 [IU] via SUBCUTANEOUS

## 2014-09-23 MED ORDER — MIDAZOLAM HCL 2 MG/2ML IJ SOLN
INTRAMUSCULAR | Status: AC
  Filled 2014-09-23: qty 4

## 2014-09-23 MED ORDER — MORPHINE SULFATE (PF) 2 MG/ML IV SOLN: 1.0000 mg | INTRAVENOUS | Status: AC | PRN

## 2014-09-23 MED ORDER — NITROGLYCERIN IN D5W 200-5 MCG/ML-% IV SOLN: 0.0000 ug/min | INTRAVENOUS | Status: DC

## 2014-09-23 MED ORDER — MILRINONE IN DEXTROSE 20 MG/100ML IV SOLN
0.3750 ug/kg/min | INTRAVENOUS | Status: AC
Start: 2014-09-23 — End: 2014-09-23
  Administered 2014-09-23: .3 ug/kg/min via INTRAVENOUS
  Filled 2014-09-23: qty 100

## 2014-09-23 MED ORDER — HEPARIN SODIUM (PORCINE) 1000 UNIT/ML IJ SOLN
INTRAMUSCULAR | Status: AC
  Filled 2014-09-23: qty 1

## 2014-09-23 MED ORDER — ROCURONIUM BROMIDE 100 MG/10ML IV SOLN
INTRAVENOUS | Status: DC | PRN
  Administered 2014-09-23 (×3): 50 mg via INTRAVENOUS

## 2014-09-23 MED ORDER — SODIUM CHLORIDE 0.45 % IV SOLN
INTRAVENOUS | Status: DC | PRN
  Administered 2014-09-24: 21:00:00 via INTRAVENOUS

## 2014-09-23 MED ORDER — METOPROLOL TARTRATE 25 MG/10 ML ORAL SUSPENSION
12.5000 mg | Freq: Two times a day (BID) | ORAL | Status: DC
  Filled 2014-09-23 (×9): qty 5

## 2014-09-23 MED ORDER — SODIUM CHLORIDE 0.9 % IR SOLN
Status: DC | PRN
  Administered 2014-09-23: 6000 mL

## 2014-09-23 MED ORDER — FENTANYL CITRATE (PF) 250 MCG/5ML IJ SOLN
INTRAMUSCULAR | Status: AC
  Filled 2014-09-23: qty 5

## 2014-09-23 MED ORDER — EPHEDRINE SULFATE 50 MG/ML IJ SOLN
INTRAMUSCULAR | Status: AC
  Filled 2014-09-23: qty 1

## 2014-09-23 MED ORDER — NOREPINEPHRINE BITARTRATE 1 MG/ML IV SOLN
0.0000 ug/min | INTRAVENOUS | Status: DC
  Administered 2014-09-23 – 2014-09-24 (×2): 3 ug/min via INTRAVENOUS
  Filled 2014-09-23 (×2): qty 4

## 2014-09-23 MED ORDER — SODIUM CHLORIDE 0.9 % IJ SOLN
INTRAMUSCULAR | Status: AC
  Filled 2014-09-23: qty 20

## 2014-09-23 MED ORDER — VANCOMYCIN HCL IN DEXTROSE 1-5 GM/200ML-% IV SOLN: 1000.0000 mg | Freq: Once | INTRAVENOUS | Status: DC

## 2014-09-23 MED ORDER — PROPOFOL 10 MG/ML IV BOLUS
INTRAVENOUS | Status: AC
  Filled 2014-09-23: qty 20

## 2014-09-23 MED ORDER — PROTAMINE SULFATE 10 MG/ML IV SOLN
INTRAVENOUS | Status: DC | PRN
Start: 2014-09-23 — End: 2014-09-23
  Administered 2014-09-23: 200 mg via INTRAVENOUS
  Administered 2014-09-23: 50 mg via INTRAVENOUS

## 2014-09-23 MED ORDER — VANCOMYCIN HCL 1000 MG IV SOLR
INTRAVENOUS | Status: AC
  Filled 2014-09-23: qty 1000

## 2014-09-23 MED ORDER — PANTOPRAZOLE SODIUM 40 MG PO TBEC
40.0000 mg | DELAYED_RELEASE_TABLET | Freq: Every day | ORAL | Status: DC
  Administered 2014-09-25 – 2014-10-06 (×13): 40 mg via ORAL
  Filled 2014-09-23 (×13): qty 1

## 2014-09-23 MED ORDER — VANCOMYCIN HCL 1000 MG IV SOLR
INTRAVENOUS | Status: DC | PRN
  Administered 2014-09-23: 1000 mL

## 2014-09-23 MED ORDER — SODIUM CHLORIDE 0.9 % IJ SOLN
3.0000 mL | INTRAMUSCULAR | Status: DC | PRN
Start: 2014-09-24 — End: 2014-10-06
  Administered 2014-09-25 – 2014-09-26 (×2): 3 mL via INTRAVENOUS
  Filled 2014-09-23 (×2): qty 3

## 2014-09-23 MED ORDER — HEMOSTATIC AGENTS (NO CHARGE) OPTIME
TOPICAL | Status: DC | PRN
  Administered 2014-09-23 (×4): 1 via TOPICAL

## 2014-09-23 MED ORDER — MIDAZOLAM HCL 2 MG/2ML IJ SOLN
2.0000 mg | INTRAMUSCULAR | Status: DC | PRN
  Administered 2014-09-23 – 2014-09-24 (×6): 2 mg via INTRAVENOUS
  Filled 2014-09-23 (×5): qty 2

## 2014-09-23 MED ORDER — METOCLOPRAMIDE HCL 5 MG/ML IJ SOLN
10.0000 mg | Freq: Four times a day (QID) | INTRAMUSCULAR | Status: AC
  Administered 2014-09-23 – 2014-09-24 (×3): 10 mg via INTRAVENOUS
  Filled 2014-09-23 (×2): qty 2

## 2014-09-23 MED ORDER — FAMOTIDINE IN NACL 20-0.9 MG/50ML-% IV SOLN
20.0000 mg | Freq: Two times a day (BID) | INTRAVENOUS | Status: AC
  Administered 2014-09-23 – 2014-09-24 (×2): 20 mg via INTRAVENOUS
  Filled 2014-09-23: qty 50

## 2014-09-23 MED ORDER — PROTAMINE SULFATE 10 MG/ML IV SOLN
INTRAVENOUS | Status: AC
  Filled 2014-09-23: qty 5

## 2014-09-23 MED ORDER — SODIUM CHLORIDE 0.9 % IV SOLN
1.0000 g/h | INTRAVENOUS | Status: DC
  Filled 2014-09-23: qty 20

## 2014-09-23 MED ORDER — PHENYLEPHRINE 40 MCG/ML (10ML) SYRINGE FOR IV PUSH (FOR BLOOD PRESSURE SUPPORT)
PREFILLED_SYRINGE | INTRAVENOUS | Status: AC
  Filled 2014-09-23: qty 10

## 2014-09-23 MED ORDER — DOCUSATE SODIUM 100 MG PO CAPS
200.0000 mg | ORAL_CAPSULE | Freq: Every day | ORAL | Status: DC
  Administered 2014-09-25 – 2014-10-06 (×11): 200 mg via ORAL
  Filled 2014-09-23 (×12): qty 2

## 2014-09-23 MED ORDER — BISACODYL 5 MG PO TBEC
10.0000 mg | DELAYED_RELEASE_TABLET | Freq: Every day | ORAL | Status: DC
  Administered 2014-09-25 – 2014-10-06 (×8): 10 mg via ORAL
  Filled 2014-09-23 (×8): qty 2

## 2014-09-23 MED ORDER — SODIUM CHLORIDE 0.9 % IV SOLN
INTRAVENOUS | Status: DC
  Administered 2014-09-23 (×2): via INTRAVENOUS
  Administered 2014-09-24: 20 mL/h via INTRAVENOUS

## 2014-09-23 MED ORDER — MIDAZOLAM HCL 2 MG/2ML IJ SOLN
INTRAMUSCULAR | Status: AC
  Filled 2014-09-23: qty 2

## 2014-09-23 MED ORDER — MAGNESIUM SULFATE 4 GM/100ML IV SOLN
4.0000 g | Freq: Once | INTRAVENOUS | Status: AC
  Administered 2014-09-23: 4 g via INTRAVENOUS
  Filled 2014-09-23: qty 100

## 2014-09-23 MED ORDER — FUROSEMIDE 10 MG/ML IJ SOLN
40.0000 mg | Freq: Once | INTRAMUSCULAR | Status: DC | PRN
  Administered 2014-09-23: 40 mg via INTRAVENOUS

## 2014-09-23 MED ORDER — ASPIRIN EC 325 MG PO TBEC
325.0000 mg | DELAYED_RELEASE_TABLET | Freq: Every day | ORAL | Status: DC
Start: 2014-09-24 — End: 2014-09-25
  Administered 2014-09-24: 325 mg via ORAL
  Filled 2014-09-23 (×2): qty 1

## 2014-09-23 MED ORDER — LACTATED RINGERS IV SOLN: 500.0000 mL | Freq: Once | INTRAVENOUS | Status: DC | PRN

## 2014-09-23 MED ORDER — DEXMEDETOMIDINE HCL IN NACL 200 MCG/50ML IV SOLN
0.0000 ug/kg/h | INTRAVENOUS | Status: DC
  Administered 2014-09-23: 0.7 ug/kg/h via INTRAVENOUS
  Administered 2014-09-23 (×2): 1 ug/kg/h via INTRAVENOUS
  Administered 2014-09-24: 0.5 ug/kg/h via INTRAVENOUS
  Administered 2014-09-24 (×2): 1 ug/kg/h via INTRAVENOUS
  Administered 2014-09-24: 0.5 ug/kg/h via INTRAVENOUS
  Administered 2014-09-24 (×2): 1 ug/kg/h via INTRAVENOUS
  Filled 2014-09-23 (×9): qty 50

## 2014-09-23 MED ORDER — FENTANYL CITRATE (PF) 100 MCG/2ML IJ SOLN
INTRAMUSCULAR | Status: DC | PRN
Start: 2014-09-23 — End: 2014-09-23
  Administered 2014-09-23 (×4): 100 ug via INTRAVENOUS
  Administered 2014-09-23: 150 ug via INTRAVENOUS
  Administered 2014-09-23: 500 ug via INTRAVENOUS
  Administered 2014-09-23: 50 ug via INTRAVENOUS
  Administered 2014-09-23: 100 ug via INTRAVENOUS
  Administered 2014-09-23: 50 ug via INTRAVENOUS

## 2014-09-23 MED ORDER — INSULIN REGULAR BOLUS VIA INFUSION
0.0000 [IU] | Freq: Three times a day (TID) | INTRAVENOUS | Status: DC
  Filled 2014-09-23: qty 10

## 2014-09-23 MED ORDER — OXYCODONE HCL 5 MG PO TABS
5.0000 mg | ORAL_TABLET | ORAL | Status: DC | PRN
  Administered 2014-09-26: 10 mg via ORAL
  Administered 2014-09-28: 5 mg via ORAL
  Administered 2014-09-28: 10 mg via ORAL
  Administered 2014-09-28: 5 mg via ORAL
  Administered 2014-09-28: 10 mg via ORAL
  Administered 2014-09-29: 5 mg via ORAL
  Administered 2014-09-29 – 2014-10-02 (×9): 10 mg via ORAL
  Administered 2014-10-03: 5 mg via ORAL
  Administered 2014-10-03 – 2014-10-04 (×2): 10 mg via ORAL
  Administered 2014-10-05 (×2): 5 mg via ORAL
  Administered 2014-10-05: 10 mg via ORAL
  Administered 2014-10-06: 5 mg via ORAL
  Filled 2014-09-23 (×5): qty 2
  Filled 2014-09-23: qty 1
  Filled 2014-09-23: qty 2
  Filled 2014-09-23: qty 1
  Filled 2014-09-23 (×4): qty 2
  Filled 2014-09-23: qty 1
  Filled 2014-09-23 (×2): qty 2
  Filled 2014-09-23: qty 1
  Filled 2014-09-23 (×2): qty 2
  Filled 2014-09-23: qty 1
  Filled 2014-09-23 (×2): qty 2
  Filled 2014-09-23 (×2): qty 1

## 2014-09-23 MED ORDER — MIDAZOLAM HCL 5 MG/5ML IJ SOLN
INTRAMUSCULAR | Status: DC | PRN
  Administered 2014-09-23: 3 mg via INTRAVENOUS
  Administered 2014-09-23 (×2): 2 mg via INTRAVENOUS
  Administered 2014-09-23: 1 mg via INTRAVENOUS
  Administered 2014-09-23 (×2): 2 mg via INTRAVENOUS

## 2014-09-23 MED ORDER — TRAMADOL HCL 50 MG PO TABS
50.0000 mg | ORAL_TABLET | ORAL | Status: DC | PRN
  Administered 2014-09-25: 100 mg via ORAL
  Administered 2014-09-26: 50 mg via ORAL
  Administered 2014-09-29 (×2): 100 mg via ORAL
  Filled 2014-09-23: qty 2
  Filled 2014-09-23: qty 1
  Filled 2014-09-23 (×2): qty 2

## 2014-09-23 MED ORDER — SODIUM CHLORIDE 0.9 % IV SOLN
20.0000 ug | INTRAVENOUS | Status: DC | PRN
  Administered 2014-09-23: 20 ug via INTRAVENOUS

## 2014-09-23 MED ORDER — MORPHINE SULFATE (PF) 2 MG/ML IV SOLN
2.0000 mg | INTRAVENOUS | Status: DC | PRN
  Administered 2014-09-23 – 2014-09-25 (×9): 2 mg via INTRAVENOUS
  Administered 2014-09-25: 4 mg via INTRAVENOUS
  Filled 2014-09-23 (×6): qty 1
  Filled 2014-09-23: qty 2
  Filled 2014-09-23 (×3): qty 1

## 2014-09-23 MED ORDER — PROPOFOL 10 MG/ML IV BOLUS
INTRAVENOUS | Status: DC | PRN
  Administered 2014-09-23: 20 mg via INTRAVENOUS

## 2014-09-23 MED ORDER — VANCOMYCIN HCL IN DEXTROSE 1-5 GM/200ML-% IV SOLN
1000.0000 mg | INTRAVENOUS | Status: AC
  Administered 2014-09-24 – 2014-09-27 (×4): 1000 mg via INTRAVENOUS
  Filled 2014-09-23 (×4): qty 200

## 2014-09-23 MED ORDER — ARTIFICIAL TEARS OP OINT
TOPICAL_OINTMENT | OPHTHALMIC | Status: DC | PRN
  Administered 2014-09-23: 1 via OPHTHALMIC

## 2014-09-23 MED ORDER — DEXTROSE 5 % IV SOLN
1.5000 g | Freq: Two times a day (BID) | INTRAVENOUS | Status: AC
  Administered 2014-09-24 – 2014-09-25 (×4): 1.5 g via INTRAVENOUS
  Filled 2014-09-23 (×4): qty 1.5

## 2014-09-23 MED ORDER — ALBUMIN HUMAN 5 % IV SOLN
250.0000 mL | INTRAVENOUS | Status: DC | PRN
  Administered 2014-09-23 – 2014-09-24 (×3): 250 mL via INTRAVENOUS
  Filled 2014-09-23 (×2): qty 250

## 2014-09-23 MED ORDER — PHENYLEPHRINE HCL 10 MG/ML IJ SOLN
0.0000 ug/min | INTRAVENOUS | Status: DC
  Filled 2014-09-23: qty 2

## 2014-09-23 MED ORDER — SODIUM CHLORIDE 0.9 % IV SOLN
INTRAVENOUS | Status: DC
  Administered 2014-09-23: 0.7 [IU]/h via INTRAVENOUS
  Administered 2014-09-23: 0.6 [IU]/h via INTRAVENOUS
  Filled 2014-09-23 (×2): qty 2.5

## 2014-09-23 MED ORDER — EPINEPHRINE HCL 1 MG/ML IJ SOLN
0.0000 ug/min | INTRAMUSCULAR | Status: DC
  Administered 2014-09-23 – 2014-09-24 (×2): 2 ug/min via INTRAVENOUS
  Filled 2014-09-23 (×2): qty 4

## 2014-09-23 MED ORDER — VANCOMYCIN HCL 1000 MG IV SOLR
INTRAVENOUS | Status: DC | PRN
  Administered 2014-09-23: 1000 mg

## 2014-09-23 MED ORDER — SODIUM CHLORIDE 0.9 % IJ SOLN
3.0000 mL | Freq: Two times a day (BID) | INTRAMUSCULAR | Status: DC
  Administered 2014-09-24 – 2014-09-25 (×3): 3 mL via INTRAVENOUS
  Administered 2014-09-26: 10 mL via INTRAVENOUS
  Administered 2014-09-27 – 2014-10-03 (×7): 3 mL via INTRAVENOUS

## 2014-09-23 MED ORDER — ACETAMINOPHEN 650 MG RE SUPP
650.0000 mg | Freq: Once | RECTAL | Status: AC
  Administered 2014-09-23: 650 mg via RECTAL

## 2014-09-23 MED ORDER — MIDAZOLAM HCL 10 MG/2ML IJ SOLN
INTRAMUSCULAR | Status: AC
  Filled 2014-09-23: qty 4

## 2014-09-23 MED ORDER — METOPROLOL TARTRATE 1 MG/ML IV SOLN
2.5000 mg | INTRAVENOUS | Status: DC | PRN
  Administered 2014-09-24 – 2014-09-25 (×4): 5 mg via INTRAVENOUS
  Administered 2014-09-26: 2.5 mg via INTRAVENOUS
  Administered 2014-09-26: 5 mg via INTRAVENOUS
  Filled 2014-09-23 (×4): qty 5

## 2014-09-23 MED ORDER — SODIUM CHLORIDE 0.9 % IJ SOLN
OROMUCOSAL | Status: DC | PRN
  Administered 2014-09-23 (×4): 4 mL via TOPICAL

## 2014-09-23 MED ORDER — AMIODARONE HCL IN DEXTROSE 360-4.14 MG/200ML-% IV SOLN
60.0000 mg/h | INTRAVENOUS | Status: AC
  Administered 2014-09-23: 30 mg/h via INTRAVENOUS
  Filled 2014-09-23: qty 200

## 2014-09-23 MED ORDER — BISACODYL 10 MG RE SUPP
10.0000 mg | Freq: Every day | RECTAL | Status: DC
  Administered 2014-09-27: 10 mg via RECTAL

## 2014-09-23 MED ORDER — SODIUM CHLORIDE 0.9 % IV SOLN
20.0000 ug | INTRAVENOUS | Status: DC
  Filled 2014-09-23: qty 5

## 2014-09-23 MED ORDER — NOREPINEPHRINE BITARTRATE 1 MG/ML IV SOLN
0.0000 ug/min | INTRAVENOUS | Status: AC
  Administered 2014-09-23: 2 ug/min via INTRAVENOUS
  Filled 2014-09-23: qty 4

## 2014-09-23 MED ORDER — ACETAMINOPHEN 160 MG/5ML PO SOLN: 650.0000 mg | Freq: Once | ORAL | Status: AC

## 2014-09-23 MED ORDER — VANCOMYCIN HCL 1000 MG IV SOLR
INTRAVENOUS | Status: DC
  Filled 2014-09-23: qty 1000

## 2014-09-23 MED ORDER — HEPARIN SODIUM (PORCINE) 1000 UNIT/ML IJ SOLN
INTRAMUSCULAR | Status: DC | PRN
Start: 2014-09-23 — End: 2014-09-23
  Administered 2014-09-23: 20000 [IU] via INTRAVENOUS
  Administered 2014-09-23: 4000 [IU] via INTRAVENOUS

## 2014-09-23 MED ORDER — METOPROLOL TARTRATE 12.5 MG HALF TABLET
12.5000 mg | ORAL_TABLET | Freq: Two times a day (BID) | ORAL | Status: DC
  Administered 2014-09-24 – 2014-09-26 (×5): 12.5 mg via ORAL
  Filled 2014-09-23 (×9): qty 1

## 2014-09-23 MED ORDER — ROCURONIUM BROMIDE 50 MG/5ML IV SOLN
INTRAVENOUS | Status: AC
  Filled 2014-09-23: qty 1

## 2014-09-23 MED ORDER — VANCOMYCIN HCL 500 MG IV SOLR
INTRAVENOUS | Status: AC
  Filled 2014-09-23: qty 500

## 2014-09-23 MED ORDER — ACETAMINOPHEN 160 MG/5ML PO SOLN
1000.0000 mg | Freq: Four times a day (QID) | ORAL | Status: AC
  Administered 2014-09-24 (×2): 1000 mg
  Filled 2014-09-23 (×2): qty 40.6

## 2014-09-23 MED ORDER — ONDANSETRON HCL 4 MG/2ML IJ SOLN
4.0000 mg | Freq: Four times a day (QID) | INTRAMUSCULAR | Status: DC | PRN
  Administered 2014-09-25 – 2014-10-03 (×6): 4 mg via INTRAVENOUS
  Filled 2014-09-23 (×6): qty 2

## 2014-09-23 MED ORDER — MILRINONE IN DEXTROSE 20 MG/100ML IV SOLN
0.3000 ug/kg/min | INTRAVENOUS | Status: DC
  Administered 2014-09-23 – 2014-09-24 (×2): 0.3 ug/kg/min via INTRAVENOUS
  Filled 2014-09-23 (×2): qty 100

## 2014-09-23 MED ORDER — SODIUM CHLORIDE 0.9 % IV SOLN: 250.0000 mL | INTRAVENOUS | Status: DC

## 2014-09-23 MED ORDER — POTASSIUM CHLORIDE 10 MEQ/50ML IV SOLN
10.0000 meq | INTRAVENOUS | Status: AC
  Administered 2014-09-23 (×3): 10 meq via INTRAVENOUS

## 2014-09-23 MED ORDER — ACETAMINOPHEN 500 MG PO TABS
1000.0000 mg | ORAL_TABLET | Freq: Four times a day (QID) | ORAL | Status: AC
  Administered 2014-09-24 – 2014-09-28 (×16): 1000 mg via ORAL
  Filled 2014-09-23 (×19): qty 2

## 2014-09-23 MED ORDER — LACTATED RINGERS IV SOLN
INTRAVENOUS | Status: DC | PRN
  Administered 2014-09-23: 07:00:00 via INTRAVENOUS

## 2014-09-23 MED FILL — Mannitol IV Soln 20%: INTRAVENOUS | Qty: 500 | Status: AC

## 2014-09-23 MED FILL — Sodium Bicarbonate IV Soln 8.4%: INTRAVENOUS | Qty: 50 | Status: AC

## 2014-09-23 MED FILL — Sodium Chloride IV Soln 0.9%: INTRAVENOUS | Qty: 2000 | Status: AC

## 2014-09-23 MED FILL — Heparin Sodium (Porcine) Inj 1000 Unit/ML: INTRAMUSCULAR | Qty: 10 | Status: AC

## 2014-09-23 MED FILL — Electrolyte-R (PH 7.4) Solution: INTRAVENOUS | Qty: 3000 | Status: AC

## 2014-09-23 SURGICAL SUPPLY — 125 items
ADAPTER CARDIO PERF ANTE/RETRO (ADAPTER) ×4 IMPLANT
APPLICATOR TIP COSEAL (VASCULAR PRODUCTS) ×8 IMPLANT
ATTRACTOMAT 16X20 MAGNETIC DRP (DRAPES) IMPLANT
BAG DECANTER FOR FLEXI CONT (MISCELLANEOUS) ×4 IMPLANT
BLADE CORE FAN STRYKER (BLADE) ×4 IMPLANT
BLADE STERNUM SYSTEM 6 (BLADE) IMPLANT
BLADE SURG 15 STRL LF DISP TIS (BLADE) ×2 IMPLANT
BLADE SURG 15 STRL SS (BLADE) ×2
BLANKET WARM CARDIAC ADLT BAIR (MISCELLANEOUS) ×4 IMPLANT
CANISTER SUCTION 2500CC (MISCELLANEOUS) ×4 IMPLANT
CANNULA GRAFT 8MMX50CM (Graft) ×4 IMPLANT
CANNULA GUNDRY RCSP 15FR (MISCELLANEOUS) ×4 IMPLANT
CANNULA SUMP PERICARDIAL (CANNULA) ×4 IMPLANT
CATH HEART VENT LEFT (CATHETERS) ×2 IMPLANT
CATH RETROPLEGIA CORONARY 14FR (CATHETERS) ×4 IMPLANT
CATH ROBINSON RED A/P 18FR (CATHETERS) ×12 IMPLANT
CAUTERY EYE LOW TEMP 1300F FIN (OPHTHALMIC RELATED) ×4 IMPLANT
CAUTERY SURG HI TEMP FINE TIP (MISCELLANEOUS) ×4 IMPLANT
CLIP FOGARTY SPRING 6M (CLIP) ×12 IMPLANT
CLIP TI LARGE 6 (CLIP) ×4 IMPLANT
CONT SPEC 4OZ CLIKSEAL STRL BL (MISCELLANEOUS) ×8 IMPLANT
COVER SURGICAL LIGHT HANDLE (MISCELLANEOUS) IMPLANT
CRADLE DONUT ADULT HEAD (MISCELLANEOUS) IMPLANT
DRAIN CHANNEL 28F RND 3/8 FF (WOUND CARE) ×4 IMPLANT
DRAIN CHANNEL 32F RND 10.7 FF (WOUND CARE) ×4 IMPLANT
DRAPE CARDIOVASCULAR INCISE (DRAPES) ×2
DRAPE SLUSH/WARMER DISC (DRAPES) ×4 IMPLANT
DRAPE SRG 135X102X78XABS (DRAPES) ×2 IMPLANT
DRSG AQUACEL AG ADV 3.5X14 (GAUZE/BANDAGES/DRESSINGS) ×4 IMPLANT
ELECT BLADE 4.0 EZ CLEAN MEGAD (MISCELLANEOUS) ×4
ELECT CAUTERY BLADE 6.4 (BLADE) ×4 IMPLANT
ELECT REM PT RETURN 9FT ADLT (ELECTROSURGICAL) ×8
ELECTRODE BLDE 4.0 EZ CLN MEGD (MISCELLANEOUS) ×2 IMPLANT
ELECTRODE REM PT RTRN 9FT ADLT (ELECTROSURGICAL) ×4 IMPLANT
FEMORAL VENOUS CANN RAP (CANNULA) ×4 IMPLANT
GAUZE SPONGE 4X4 12PLY STRL (GAUZE/BANDAGES/DRESSINGS) ×4 IMPLANT
GLOVE BIO SURGEON STRL SZ 6.5 (GLOVE) ×9 IMPLANT
GLOVE BIO SURGEONS STRL SZ 6.5 (GLOVE) ×3
GLOVE BIOGEL PI IND STRL 6 (GLOVE) ×2 IMPLANT
GLOVE BIOGEL PI IND STRL 7.0 (GLOVE) ×8 IMPLANT
GLOVE BIOGEL PI INDICATOR 6 (GLOVE) ×2
GLOVE BIOGEL PI INDICATOR 7.0 (GLOVE) ×8
GOWN STRL REUS W/ TWL LRG LVL3 (GOWN DISPOSABLE) ×16 IMPLANT
GOWN STRL REUS W/TWL LRG LVL3 (GOWN DISPOSABLE) ×16
HANDLE STAPLE ENDO GIA SHORT (STAPLE) ×2
HEMOSTAT POWDER SURGIFOAM 1G (HEMOSTASIS) ×16 IMPLANT
HEMOSTAT SURGICEL 2X14 (HEMOSTASIS) ×4 IMPLANT
INSERT FOGARTY XLG (MISCELLANEOUS) ×4 IMPLANT
KIT BASIN OR (CUSTOM PROCEDURE TRAY) ×4 IMPLANT
KIT DRAINAGE VACCUM ASSIST (KITS) ×4 IMPLANT
KIT ROOM TURNOVER OR (KITS) ×4 IMPLANT
KIT SUCTION CATH 14FR (SUCTIONS) ×4 IMPLANT
LINE VENT (MISCELLANEOUS) ×4 IMPLANT
MARKER GRAFT CORONARY BYPASS (MISCELLANEOUS) IMPLANT
NEEDLE 22X1 1/2 (OR ONLY) (NEEDLE) ×4 IMPLANT
NS IRRIG 1000ML POUR BTL (IV SOLUTION) ×24 IMPLANT
PACK OPEN HEART (CUSTOM PROCEDURE TRAY) ×4 IMPLANT
PAD ARMBOARD 7.5X6 YLW CONV (MISCELLANEOUS) ×16 IMPLANT
PENCIL BUTTON HOLSTER BLD 10FT (ELECTRODE) IMPLANT
RELOAD ENDO GIA 30 3.5 (STAPLE) ×4 IMPLANT
SEALANT SURG COSEAL 8ML (VASCULAR PRODUCTS) ×4 IMPLANT
SET CARDIOPLEGIA MPS 5001102 (MISCELLANEOUS) ×4 IMPLANT
SHEATH AVANTI 11CM 5FR (MISCELLANEOUS) ×4 IMPLANT
SPONGE GAUZE 4X4 12PLY STER LF (GAUZE/BANDAGES/DRESSINGS) ×4 IMPLANT
SPONGE LAP 18X18 X RAY DECT (DISPOSABLE) ×8 IMPLANT
STAPLER ENDO GIA 12MM SHORT (STAPLE) ×2 IMPLANT
STAPLER VISISTAT 35W (STAPLE) ×4 IMPLANT
STOPCOCK 4 WAY LG BORE MALE ST (IV SETS) ×4 IMPLANT
SURGIFLO W/THROMBIN 8M KIT (HEMOSTASIS) ×4 IMPLANT
SUT ETHIBON 2 0 V 52N 30 (SUTURE) ×4 IMPLANT
SUT ETHIBON EXCEL 2-0 V-5 (SUTURE) IMPLANT
SUT ETHIBOND 2 0 SH (SUTURE) ×8
SUT ETHIBOND 2 0 SH 36X2 (SUTURE) ×8 IMPLANT
SUT ETHIBOND 2 0 V4 (SUTURE) IMPLANT
SUT ETHIBOND 2 0V4 GREEN (SUTURE) IMPLANT
SUT ETHIBOND 4 0 RB 1 (SUTURE) IMPLANT
SUT ETHIBOND V-5 VALVE (SUTURE) IMPLANT
SUT PROLENE 3 0 SH 1 (SUTURE) IMPLANT
SUT PROLENE 3 0 SH DA (SUTURE) IMPLANT
SUT PROLENE 3 0 SH1 36 (SUTURE) ×8 IMPLANT
SUT PROLENE 4 0 RB 1 (SUTURE) ×2
SUT PROLENE 4 0 SH DA (SUTURE) ×28 IMPLANT
SUT PROLENE 4-0 RB1 .5 CRCL 36 (SUTURE) ×2 IMPLANT
SUT PROLENE 5 0 C 1 36 (SUTURE) ×16 IMPLANT
SUT PROLENE 6 0 C 1 30 (SUTURE) ×8 IMPLANT
SUT PROLENE 6 0 CC (SUTURE) ×20 IMPLANT
SUT SILK  1 MH (SUTURE) ×8
SUT SILK 1 MH (SUTURE) ×8 IMPLANT
SUT SILK 1 TIES 10X30 (SUTURE) ×4 IMPLANT
SUT SILK 2 0 (SUTURE) ×2
SUT SILK 2 0 SH CR/8 (SUTURE) ×16 IMPLANT
SUT SILK 2 0 TIES 10X30 (SUTURE) ×4 IMPLANT
SUT SILK 2 0 TIES 17X18 (SUTURE) ×2
SUT SILK 2-0 18XBRD TIE 12 (SUTURE) ×2 IMPLANT
SUT SILK 2-0 18XBRD TIE BLK (SUTURE) ×2 IMPLANT
SUT SILK 3 0 SH CR/8 (SUTURE) ×8 IMPLANT
SUT SILK 4 0 (SUTURE) ×2
SUT SILK 4 0 TIE 10X30 (SUTURE) ×8 IMPLANT
SUT SILK 4-0 18XBRD TIE 12 (SUTURE) ×2 IMPLANT
SUT STEEL 6MS V (SUTURE) ×8 IMPLANT
SUT STEEL SZ 6 DBL 3X14 BALL (SUTURE) ×4 IMPLANT
SUT TEM PAC WIRE 2 0 SH (SUTURE) ×8 IMPLANT
SUT VIC AB 1 CTX 18 (SUTURE) ×4 IMPLANT
SUT VIC AB 1 CTX 36 (SUTURE)
SUT VIC AB 1 CTX36XBRD ANBCTR (SUTURE) IMPLANT
SUT VIC AB 2-0 CTX 27 (SUTURE) ×12 IMPLANT
SUT VIC AB 3-0 SH 8-18 (SUTURE) ×4 IMPLANT
SUT VIC AB 3-0 X1 27 (SUTURE) ×8 IMPLANT
SWAB COLLECTION DEVICE MRSA (MISCELLANEOUS) ×8 IMPLANT
SYR 10ML KIT SKIN ADHESIVE (MISCELLANEOUS) IMPLANT
SYSTEM SAHARA CHEST DRAIN ATS (WOUND CARE) ×4 IMPLANT
TAPE PAPER 2X10 WHT MICROPORE (GAUZE/BANDAGES/DRESSINGS) ×8 IMPLANT
TOWEL OR 17X24 6PK STRL BLUE (TOWEL DISPOSABLE) ×4 IMPLANT
TOWEL OR 17X26 10 PK STRL BLUE (TOWEL DISPOSABLE) ×4 IMPLANT
TRAY CATH LUMEN 1 20CM STRL (SET/KITS/TRAYS/PACK) ×4 IMPLANT
TRAY FOLEY IC TEMP SENS 14FR (CATHETERS) IMPLANT
TRAY FOLEY IC TEMP SENS 16FR (CATHETERS) ×4 IMPLANT
TUBE CONNECTING 12'X1/4 (SUCTIONS) ×1
TUBE CONNECTING 12X1/4 (SUCTIONS) ×3 IMPLANT
TUBING ART PRESS 48 MALE/FEM (TUBING) ×8 IMPLANT
UNDERPAD 30X30 INCONTINENT (UNDERPADS AND DIAPERS) ×4 IMPLANT
VENT LEFT HEART 12002 (CATHETERS) ×4
WATER STERILE IRR 1000ML POUR (IV SOLUTION) ×8 IMPLANT
WIRE BENTSON .035X145CM (WIRE) ×4 IMPLANT
YANKAUER SUCT BULB TIP NO VENT (SUCTIONS) ×4 IMPLANT

## 2014-09-23 NOTE — Anesthesia Procedure Notes (Addendum)
Procedure Name: Intubation Date/Time: 09/23/2014 8:20 AM Performed by: Wray Kearns A Pre-anesthesia Checklist: Patient identified, Emergency Drugs available, Suction available, Patient being monitored and Timeout performed Patient Re-evaluated:Patient Re-evaluated prior to inductionOxygen Delivery Method: Circle system utilized Preoxygenation: Pre-oxygenation with 100% oxygen Intubation Type: IV induction Ventilation: Mask ventilation without difficulty Laryngoscope Size: Miller and 2 Grade View: Grade I Tube type: Oral Tube size: 7.5 mm Number of attempts: 1 Airway Equipment and Method: Stylet Placement Confirmation: ETT inserted through vocal cords under direct vision,  breath sounds checked- equal and bilateral and positive ETCO2 Secured at: 21 cm Tube secured with: Tape Dental Injury: Teeth and Oropharynx as per pre-operative assessment     Procedures: Right IJ Theone Murdoch Catheter Insertion: [id number]: The patient was identified and consent obtained.  TO was performed, and full barrier precautions were used.  The skin was anesthetized with lidocaine-4cc plain with 25g needle.  Once the vein was located with the 22 ga. needle using ultrasound guidance , the wire was inserted into the vein.  The wire location was confirmed with ultrasound.  The tissue was dilated and the 8.5 [place] cordis catheter was carefully inserted. Afterwards Theone Murdoch catheter was inserted. PA catheter at 48cm.  The patient tolerated the procedure well.   RIJ CVP Dual Lumen: [id number]: The patient was identified and consent obtained.  TO was performed, and full barrier precautions were used.  The skin was anesthetized with lidocaine.  Once the vein was located with the 22 ga. needle using ultrasound guidance , the wire was inserted into the vein.  The wire location was confirmed with ultrasound.  The tissue was dilated and the catheter was carefully inserted, then sutured in place. A dressing was applied. The patient  tolerated the procedure well.   CE

## 2014-09-23 NOTE — Progress Notes (Signed)
T[facility] BRIEF SICU PROGRESS NOTE  Day of Surgery  S/P Procedure(s) (LRB): REDO STERNOTOMY (N/A) REPAIR OF ASCENDING AORTIC PSEUDOANEURYSM (N/A) TRANSESOPHAGEAL ECHOCARDIOGRAM (TEE) (N/A)   Sedated on vent Afib w/ controlled rate Hemodynamics stable on nitric oxide, levophed, epi and milrinone drips O2 sats 100% on 50% FiO2 UOP 125 mL last hour Chest tube output low  Plan: Continue current plan.  Keep sedated on vent.    Ashley [name] 09/23/2014 6:43 PM

## 2014-09-23 NOTE — Progress Notes (Signed)
ANTIBIOTIC CONSULT NOTE - INITIAL  Pharmacy Consult for vancomycin Indication: empiric   Allergies  Allergen Reactions  . Codeine Nausea And Vomiting  . Darvocet [Propoxyphene N-Acetaminophen] Nausea And Vomiting  . Iodine Other (See Comments)    Pt does not recall this reaction  . Lisinopril Other (See Comments)    Possible cough    Patient Measurements: Height: 5\' 3"  (160 cm) Weight: 174 lb 13.2 oz (79.3 kg) IBW/kg (Calculated) : 52.4 Adjusted Body Weight:   Vital Signs: Temp: 98.6 F (37 C) (09/13 0400) Temp Source: Oral (09/13 0400) BP: 92/68 mmHg (09/13 0600) Pulse Rate: 46 (09/13 0749) Intake/Output from previous day: 09/12 0701 - 09/13 0700 In: 1325 [P.O.:160; I.V.:915; IV Piggyback:250] Out: 350 [Urine:350] Intake/Output from this shift: Total I/O In: 5067 [I.V.:2700; Blood:2367] Out: 2250 [Urine:300; Blood:1950]  Labs:  Recent Labs  09/20/14 2000 09/22/14 0216  09/23/14 0225  09/23/14 1029 09/23/14 1055 09/23/14 1100 09/23/14 1205  WBC 12.6* 10.3  --  10.1  --   --   --   --   --   HGB 11.0* 10.5*  --  10.4*  < > 6.5* 8.0* 8.8* 8.8*  PLT 214 202  --  184  --   --  125*  --   --   CREATININE 1.21* 2.08*  < > 2.04*  < > 1.30*  --  1.40* 1.40*  < > = values in this interval not displayed. Estimated Creatinine Clearance: 35.2 mL/min (by C-G formula based on Cr of 1.4). No results for input(s): VANCOTROUGH, VANCOPEAK, VANCORANDOM, GENTTROUGH, GENTPEAK, GENTRANDOM, TOBRATROUGH, TOBRAPEAK, TOBRARND, AMIKACINPEAK, AMIKACINTROU, AMIKACIN in the last 72 hours.   Microbiology: Recent Results (from the past 720 hour(s))  MRSA PCR Screening     Status: None   Collection Time: 09/20/14  3:54 PM  Result Value Ref Range Status   MRSA by PCR NEGATIVE NEGATIVE Final    Comment:        The GeneXpert MRSA Assay (FDA approved for NASAL specimens only), is one component of a comprehensive MRSA colonization surveillance program. It is not intended to diagnose  MRSA infection nor to guide or monitor treatment for MRSA infections.   Surgical pcr screen     Status: None   Collection Time: 09/22/14  2:09 AM  Result Value Ref Range Status   MRSA, PCR NEGATIVE NEGATIVE Final   Staphylococcus aureus NEGATIVE NEGATIVE Final    Comment:        The Xpert SA Assay (FDA approved for NASAL specimens in patients over 1 years of age), is one component of a comprehensive surveillance program.  Test performance has been validated by [facility] for patients greater than or equal to 64 year old. It is not intended to diagnose infection nor to guide or monitor treatment.   Gram stain     Status: None   Collection Time: 09/23/14 10:57 AM  Result Value Ref Range Status   Specimen Description PERICARDIAL  Final   Special Requests NONE  Final   Gram Stain   Final    RARE WBC PRESENT,BOTH PMN AND MONONUCLEAR NO ORGANISMS SEEN Gram Stain Report Called to,Read Back By and Verified With: Laurann Montana AT 1129 09/23/14 BY L BENFIELD    Report Status 09/23/2014 FINAL  Final    Medical History: Past Medical History  Diagnosis Date  . HTN (hypertension)   . Dyslipidemia   . GERD (gastroesophageal reflux disease)   . Mitral regurgitation     on recent TEE  07/07/2010  . DJD (degenerative joint disease), cervical     s/p epidural steroid injections  . Hiatal hernia   . Osteopenia     frax neg 11/10, repeat in 2-4 years DJD C spine Dr. Ethelene Hal s/p epidural steroid injections   . Paroxysmal atrial fibrillation     on coumadin  . Diastolic dysfunction   . Rheumatic aortic stenosis     I will get an echo today to evaluate her murmur  . Heart murmur   . Diverticulitis     Medications:  Anti-infectives    Start     Dose/Rate Route Frequency Ordered Stop   09/24/14 0830  vancomycin (VANCOCIN) IVPB 1000 mg/200 mL premix     1,000 mg 200 mL/hr over 60 Minutes Intravenous Every 24 hours 09/23/14 1351     09/23/14 2030  vancomycin (VANCOCIN) IVPB 1000  mg/200 mL premix  Status:  Discontinued     1,000 mg 200 mL/hr over 60 Minutes Intravenous  Once 09/23/14 1338 09/23/14 1345   09/23/14 1630  cefUROXime (ZINACEF) 1.5 g in dextrose 5 % 50 mL IVPB     1.5 g 100 mL/hr over 30 Minutes Intravenous Every 12 hours 09/23/14 1337 09/25/14 1629   09/23/14 1111  vancomycin (VANCOCIN) powder  Status:  Discontinued       As needed 09/23/14 1111 09/23/14 1335   09/23/14 1110  vancomycin (VANCOCIN) 1,000 mg in sodium chloride 0.9 % 1,000 mL irrigation  Status:  Discontinued       As needed 09/23/14 1110 09/23/14 1335   09/23/14 1045  vancomycin (VANCOCIN) 1,000 mg in sodium chloride 0.9 % 1,000 mL irrigation  Status:  Discontinued      Irrigation To Surgery 09/23/14 1041 09/23/14 1337   09/23/14 0400  vancomycin (VANCOCIN) 1,250 mg in sodium chloride 0.9 % 250 mL IVPB     1,250 mg 166.7 mL/hr over 90 Minutes Intravenous To Surgery 09/22/14 1518 09/23/14 0930   09/23/14 0400  cefUROXime (ZINACEF) 1.5 g in dextrose 5 % 50 mL IVPB     1.5 g 100 mL/hr over 30 Minutes Intravenous To Surgery 09/22/14 1518 09/23/14 1229   09/23/14 0400  cefUROXime (ZINACEF) 750 mg in dextrose 5 % 50 mL IVPB  Status:  Discontinued     750 mg 100 mL/hr over 30 Minutes Intravenous To Surgery 09/22/14 1518 09/23/14 1337     Assessment: 74 yof s/p redo sternotomy and ascending aorta replacement to start IV vancomycin empirically. Pt with Tmax 99 and WBC is WNL. Scr is mildly elevated at  1.4. She is also on post-op cefuroxime.   Vanc 9/13>>  Goal of Therapy:  Vancomycin trough level 15-20 mcg/ml  Plan:  - Vancomycin 1gm IV Q24H - F/u renal fxn, C&S, clinical status and trough at [facility]  Shemar Plemmons, Drake Leach 09/23/2014,1:52 PM

## 2014-09-23 NOTE — Progress Notes (Signed)
The patient was examined and preop studies reviewed. There has been no change from the prior exam and the patient is ready for surgery.  Plan redo sternotomy and replacement of ascending aorta on F Lottman

## 2014-09-23 NOTE — Progress Notes (Signed)
Pt transported to pre-op via stretcher and portable monitor with this RN and OR tech with no complications.  Report given to CRNA.

## 2014-09-23 NOTE — OR Nursing (Signed)
One hour call to SICU charge nurse at 1126.

## 2014-09-23 NOTE — Anesthesia Preprocedure Evaluation (Addendum)
Anesthesia Evaluation  Patient identified by MRN, date of birth, ID band Patient awake    Reviewed: Allergy & Precautions, NPO status , Patient's Chart, lab work & pertinent test results  History of Anesthesia Complications Negative for: history of anesthetic complications  Airway Mallampati: II  TM Distance: >3 FB Neck ROM: Full    Dental  (+) Dental Advisory Given   Pulmonary former smoker,    breath sounds clear to auscultation       Cardiovascular hypertension, Pt. on medications and Pt. on home beta blockers + Peripheral Vascular Disease  + dysrhythmias Atrial Fibrillation + Valvular Problems/Murmurs (s/p AVR, MVR)  Rhythm:Irregular Rate:Normal  9/16 ECHO: EF 60-65%, 18-19 mm thick space of intermediate echogenicity(abscess vs hematoma), located anterior to the mid ascending aorta, right ventricular dilation, worsened right ventricular function and increased PA pressure    Neuro/Psych negative neurological ROS  negative psych ROS   GI/Hepatic GERD  Medicated and Controlled,Elevated LFTs   Endo/Other  Morbid obesity  Renal/GU Renal InsufficiencyRenal disease (creat 2.04)     Musculoskeletal   Abdominal (+) + obese,   Peds  Hematology  (+) Blood dyscrasia (coumadin INR 1.28, Hb 10.4), ,   Anesthesia Other Findings   Reproductive/Obstetrics                            Anesthesia Physical Anesthesia Plan  ASA: IV  Anesthesia Plan: General   Post-op Pain Management:    Induction: Intravenous  Airway Management Planned: Oral ETT  Additional Equipment: Arterial line, CVP, PA Cath, 3D TEE and Ultrasound Guidance Line Placement  Intra-op Plan: Utilization Of Total Body Hypothermia per surgeon request and Delibrate Circulatory arrest per surgeon request  Post-operative Plan: Post-operative intubation/ventilation  Informed Consent: I have reviewed the patients History and Physical,  chart, labs and discussed the procedure including the risks, benefits and alternatives for the proposed anesthesia with the patient or authorized representative who has indicated his/her understanding and acceptance.   Dental advisory given  Plan Discussed with: CRNA and Surgeon  Anesthesia Plan Comments: (Plan routine monitors, A line, PA catheter, TEE, GETA with hypothermic circ arrest (as per surgeon), transfusion, post op ventilation)        Anesthesia Quick Evaluation

## 2014-09-23 NOTE — OR Nursing (Signed)
12:55 - 2nd call to SICU nurse

## 2014-09-23 NOTE — Progress Notes (Signed)
Inpatient Diabetes Program Recommendations  AACE/ADA: New Consensus Statement on Inpatient Glycemic Control (2015)  Target Ranges:  Prepandial:   less than 140 mg/dL      Peak postprandial:   less than 180 mg/dL (1-2 hours)      Critically ill patients:  140 - 180 mg/dL    Results for CHANNING, SAVICH (MRN [id number]) as of 09/23/2014 12:13  Ref. Range 09/22/2014 16:15  Hemoglobin A1C Latest Ref Range: 4.8-5.6 % 6.8 (H)     Admit with: CP/ Ascending aortic pseudoaneurysm   History: HTN  Current Insulin Orders: IV insulin drip    MD- Do not see that patient has a History of DM in her H&P.  Is this a new diagnosis of DM?  A1c was 6.8% this admission.      Will follow Ambrose Finland RN, MSN, CDE Diabetes Coordinator Inpatient Glycemic Control Team Team Pager: [phone] (8a-5p)

## 2014-09-23 NOTE — OR Nursing (Signed)
45 minute call to SICU charge nurse at 1141.

## 2014-09-23 NOTE — Anesthesia Postprocedure Evaluation (Signed)
  Anesthesia Post-op Note  Patient: Ashley [name]  Procedure(s) Performed: Procedure(s) with comments: REDO STERNOTOMY (N/A) REPAIR OF ASCENDING AORTIC PSEUDOANEURYSM (N/A) - Nitrous oxide TRANSESOPHAGEAL ECHOCARDIOGRAM (TEE) (N/A)  Patient Location: SICU  Anesthesia Type:General  Level of Consciousness: sedated and Patient remains intubated per anesthesia plan  Airway and Oxygen Therapy: Patient remains intubated per anesthesia plan and Patient placed on Ventilator (see vital sign flow sheet for setting)  Post-op Pain: none  Post-op Assessment: Post-op Vital signs reviewed, Respiratory Function Stable and Pain level controlled, continues to require hemodynamic support with pressors and inotropes              Post-op Vital Signs: Reviewed and stable  Last Vitals:  Filed Vitals:   09/23/14 1745  BP:   Pulse: 109  Temp: 36.2 C  Resp: 12    Complications: No apparent anesthesia complications

## 2014-09-23 NOTE — Transfer of Care (Signed)
Immediate Anesthesia Transfer of Care Note  Patient: Ashley [name]  Procedure(s) Performed: Procedure(s) with comments: REDO STERNOTOMY (N/A) REPAIR OF ASCENDING AORTIC PSEUDOANEURYSM (N/A) - Nitrous oxide TRANSESOPHAGEAL ECHOCARDIOGRAM (TEE) (N/A)  Patient Location: SICU  Anesthesia Type:General  Level of Consciousness: Patient remains intubated per anesthesia plan  Airway & Oxygen Therapy: Patient remains intubated per anesthesia plan and Patient placed on Ventilator (see vital sign flow sheet for setting)  Post-op Assessment: Report given to RN and Post -op Vital signs reviewed and stable  Post vital signs: Reviewed and stable  Last Vitals:  Filed Vitals:   09/23/14 0600  BP: 92/68  Pulse:   Temp:   Resp: 19    Complications: No apparent anesthesia complications

## 2014-09-23 NOTE — Progress Notes (Signed)
Echocardiogram 2D Echocardiogram has been performed.  Dorothey Baseman 09/23/2014, 10:12 AM

## 2014-09-24 ENCOUNTER — Encounter (HOSPITAL_COMMUNITY): Payer: Self-pay | Admitting: Cardiothoracic Surgery

## 2014-09-24 ENCOUNTER — Inpatient Hospital Stay (HOSPITAL_COMMUNITY): Payer: Medicare Other

## 2014-09-24 DIAGNOSIS — J9601 Acute respiratory failure with hypoxia: Secondary | ICD-10-CM

## 2014-09-24 DIAGNOSIS — I2609 Other pulmonary embolism with acute cor pulmonale: Secondary | ICD-10-CM

## 2014-09-24 DIAGNOSIS — I272 Other secondary pulmonary hypertension: Secondary | ICD-10-CM

## 2014-09-24 LAB — BASIC METABOLIC PANEL
Anion gap: 11 (ref 5–15)
BUN: 32 mg/dL — AB (ref 6–20)
CALCIUM: 7 mg/dL — AB (ref 8.9–10.3)
CHLORIDE: 103 mmol/L (ref 101–111)
CO2: 21 mmol/L — AB (ref 22–32)
CREATININE: 1.66 mg/dL — AB (ref 0.44–1.00)
GFR calc non Af Amer: 29 mL/min — ABNORMAL LOW (ref >60)
GFR, EST AFRICAN AMERICAN: 34 mL/min — AB (ref >60)
GLUCOSE: 129 mg/dL — AB (ref 65–99)
Potassium: 4.2 mmol/L (ref 3.5–5.1)
Sodium: 135 mmol/L (ref 135–145)

## 2014-09-24 LAB — CREATININE, SERUM
Creatinine, Ser: 1.63 mg/dL — ABNORMAL HIGH (ref 0.44–1.00)
GFR calc non Af Amer: 30 mL/min — ABNORMAL LOW (ref >60)
GFR, EST AFRICAN AMERICAN: 35 mL/min — AB (ref >60)

## 2014-09-24 LAB — POCT I-STAT, CHEM 8
BUN: 28 mg/dL — AB (ref 6–20)
CALCIUM ION: 1.1 mmol/L — AB (ref 1.13–1.30)
CREATININE: 1.5 mg/dL — AB (ref 0.44–1.00)
Chloride: 104 mmol/L (ref 101–111)
Glucose, Bld: 138 mg/dL — ABNORMAL HIGH (ref 65–99)
HCT: 25 % — ABNORMAL LOW (ref 36.0–46.0)
HEMOGLOBIN: 8.5 g/dL — AB (ref 12.0–15.0)
Potassium: 4 mmol/L (ref 3.5–5.1)
SODIUM: 137 mmol/L (ref 135–145)
TCO2: 20 mmol/L (ref 0–100)

## 2014-09-24 LAB — POCT I-STAT 3, ART BLOOD GAS (G3+)
ACID-BASE DEFICIT: 5 mmol/L — AB (ref 0.0–2.0)
ACID-BASE DEFICIT: 3 mmol/L — AB (ref 0.0–2.0)
Acid-base deficit: 4 mmol/L — ABNORMAL HIGH (ref 0.0–2.0)
Bicarbonate: 20.7 mEq/L (ref 20.0–24.0)
Bicarbonate: 21.5 mEq/L (ref 20.0–24.0)
Bicarbonate: 20.9 mEq/L (ref 20.0–24.0)
O2 SAT: 97 %
O2 SAT: 96 %
O2 Saturation: 99 %
PCO2 ART: 38 mmHg (ref 35.0–45.0)
PCO2 ART: 34.3 mmHg — AB (ref 35.0–45.0)
PH ART: 7.343 — AB (ref 7.350–7.450)
PO2 ART: 123 mmHg — AB (ref 80.0–100.0)
Patient temperature: 36.7
Patient temperature: 36.6
TCO2: 22 mmol/L (ref 0–100)
TCO2: 23 mmol/L (ref 0–100)
TCO2: 22 mmol/L (ref 0–100)
pCO2 arterial: 36.1 mmHg (ref 35.0–45.0)
pH, Arterial: 7.404 (ref 7.350–7.450)
pH, Arterial: 7.368 (ref 7.350–7.450)
pO2, Arterial: 91 mmHg (ref 80.0–100.0)
pO2, Arterial: 84 mmHg (ref 80.0–100.0)

## 2014-09-24 LAB — GLUCOSE, CAPILLARY
GLUCOSE-CAPILLARY: 127 mg/dL — AB (ref 65–99)
GLUCOSE-CAPILLARY: 114 mg/dL — AB (ref 65–99)
GLUCOSE-CAPILLARY: 140 mg/dL — AB (ref 65–99)
Glucose-Capillary: 122 mg/dL — ABNORMAL HIGH (ref 65–99)
Glucose-Capillary: 131 mg/dL — ABNORMAL HIGH (ref 65–99)
Glucose-Capillary: 133 mg/dL — ABNORMAL HIGH (ref 65–99)
Glucose-Capillary: 90 mg/dL (ref 65–99)
Glucose-Capillary: 99 mg/dL (ref 65–99)

## 2014-09-24 LAB — PREPARE FRESH FROZEN PLASMA
Unit division: 0
Unit division: 0
Unit division: 0
Unit division: 0

## 2014-09-24 LAB — MAGNESIUM
Magnesium: 2.4 mg/dL (ref 1.7–2.4)
Magnesium: 2.2 mg/dL (ref 1.7–2.4)

## 2014-09-24 LAB — PREPARE PLATELET PHERESIS: Unit division: 0

## 2014-09-24 LAB — CBC
HEMATOCRIT: 27.4 % — AB (ref 36.0–46.0)
Hemoglobin: 9.2 g/dL — ABNORMAL LOW (ref 12.0–15.0)
MCH: 30.3 pg (ref 26.0–34.0)
MCHC: 33.6 g/dL (ref 30.0–36.0)
MCV: 90.1 fL (ref 78.0–100.0)
Platelets: 121 10*3/uL — ABNORMAL LOW (ref 150–400)
RBC: 3.04 MIL/uL — ABNORMAL LOW (ref 3.87–5.11)
RDW: 14.9 % (ref 11.5–15.5)
WBC: 10.4 10*3/uL (ref 4.0–10.5)

## 2014-09-24 MED ORDER — ANTISEPTIC ORAL RINSE SOLUTION (CORINZ)
7.0000 mL | Freq: Four times a day (QID) | OROMUCOSAL | Status: DC
  Administered 2014-09-24 – 2014-09-29 (×18): 7 mL via OROMUCOSAL

## 2014-09-24 MED ORDER — FUROSEMIDE 10 MG/ML IJ SOLN
40.0000 mg | Freq: Two times a day (BID) | INTRAMUSCULAR | Status: DC
  Administered 2014-09-24 (×2): 40 mg via INTRAVENOUS
  Filled 2014-09-24 (×5): qty 4

## 2014-09-24 MED ORDER — ALBUMIN HUMAN 5 % IV SOLN
250.0000 mL | INTRAVENOUS | Status: AC | PRN
Start: 2014-09-24 — End: 2014-09-24

## 2014-09-24 MED ORDER — CALCIUM CHLORIDE 10 % IV SOLN
1.0000 g | Freq: Once | INTRAVENOUS | Status: AC
  Administered 2014-09-24: 1 g via INTRAVENOUS
  Filled 2014-09-24: qty 10

## 2014-09-24 MED ORDER — CHLORHEXIDINE GLUCONATE 0.12% ORAL RINSE (MEDLINE KIT)
15.0000 mL | Freq: Two times a day (BID) | OROMUCOSAL | Status: DC
  Administered 2014-09-24 – 2014-09-26 (×5): 15 mL via OROMUCOSAL

## 2014-09-24 MED ORDER — MILRINONE IN DEXTROSE 20 MG/100ML IV SOLN
0.2500 ug/kg/min | INTRAVENOUS | Status: DC
  Administered 2014-09-24 – 2014-09-28 (×10): 0.375 ug/kg/min via INTRAVENOUS
  Administered 2014-09-29 – 2014-09-30 (×2): 0.25 ug/kg/min via INTRAVENOUS
  Filled 2014-09-24 (×14): qty 100

## 2014-09-24 MED ORDER — EPINEPHRINE HCL 1 MG/ML IJ SOLN
0.0000 ug/min | INTRAVENOUS | Status: DC
  Filled 2014-09-24: qty 4

## 2014-09-24 MED ORDER — SILDENAFIL CITRATE 20 MG PO TABS
20.0000 mg | ORAL_TABLET | Freq: Three times a day (TID) | ORAL | Status: DC
  Administered 2014-09-24 (×2): 20 mg via ORAL
  Filled 2014-09-24 (×6): qty 1

## 2014-09-24 MED FILL — Potassium Chloride Inj 2 mEq/ML: INTRAVENOUS | Qty: 40 | Status: AC

## 2014-09-24 MED FILL — Magnesium Sulfate Inj 50%: INTRAMUSCULAR | Qty: 10 | Status: AC

## 2014-09-24 MED FILL — Heparin Sodium (Porcine) Inj 1000 Unit/ML: INTRAMUSCULAR | Qty: 30 | Status: AC

## 2014-09-24 NOTE — Consult Note (Signed)
Advanced Heart Failure Team Consult Note  Referring Physician: Dr. Sharene Butters Trigt Primary Physician: Dr Karsten Fells Primary Cardiologist: Dr Armanda Magic previously.  Reason for Consultation:  Pulmonary HTN and RV dysfunction   HPI:    Ashley [name] is a 74 y.o. female with HTN, dyslipidemia, PAF on coumadin and valvular heart disease s/p MV repair, MAZE, and AVR in 04/2010 by Dr. Donata Clay.  This was complicated by excessive bleeding and required removal of the aortic cannula with cross clamping of the distal ascending aorta and hemashield patch repair of the anterior wall of the aorta.    She had been doing well up until the fast few weeks with substernal CP and cough.  She was admitted to [facility] in [place] where [facility] showed no CAD. 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 She was transferred to [facility] for further care by cardiac surgery team on 09/20/14.  On 9/13 she had Redo sternotomy with repair of ascending aorta pseudoaneurysm with TEE. Please see op note for full details. Post operatively weaned off Norepi.  Remains on Milrinone 0.3 mcg, Epi 2 mcg, and Amio gtt at 30 mg/hr. She is currently on 27 ppm of Nitric Oxide with plans to wean and extubate as able today.  Echo 09/22/14 LVEF 60-65%, RV mod dilated, and mod-severely reduced, PA peak pressure 57 mm Hg.  Swan Numbers performed by Dr Gala Romney at bedside CVP 14 PAP 51/27 (39) Wedge 17 CO 3.49 CI 1.9 PVR 6.3    Review of Systems: Intubated and sedated, information obtained from medical history.  Home Medications Prior to Admission medications   Medication Sig Start Date End Date Taking? Authorizing Provider  acetaminophen (TYLENOL) 325 MG tablet Take 650 mg by mouth every 6 (six) hours as needed (pain).    Yes Historical Provider, MD  alendronate (FOSAMAX) 70 MG  tablet Take 70 mg by mouth once a week. On Saturdays 09/15/14  Yes Historical Provider, MD  amoxicillin (AMOXIL) 875 MG tablet Take 875 mg by mouth 2 (two) times daily. 10 day course started 09/16/14 09/16/14  Yes Historical Provider, MD  Calcium Carb-Cholecalciferol (CALCIUM + D3 PO) Take 1 tablet by mouth daily. Calcium 1200 mg   Yes Historical Provider, MD  carvedilol (COREG) 25 MG tablet Take 25 mg by mouth 2 (two) times daily with a meal.  09/07/14  Yes Historical Provider, MD  cyclobenzaprine (FLEXERIL) 10 MG tablet Take 10 mg by mouth 3 (three) times daily as needed. 09/16/14  Yes Historical Provider, MD  ferrous sulfate 325 (65 FE) MG EC tablet Take 325 mg by mouth 2 (two) times daily with a meal.     Yes Historical Provider, MD  furosemide (LASIX) 40 MG tablet Take 40 mg by mouth See admin instructions. Take 1 tablet (40 mg) by mouth with breakfast and supper, may take an additional tablet (40 mg) at bedtime as needed for leg swelling 08/23/14  Yes Historical Provider, MD  HYDROcodone-acetaminophen (NORCO/VICODIN) 5-325 MG per tablet Take 1 tablet by mouth every 4 (four) hours as needed. 09/16/14  Yes Historical Provider, MD  lisinopril (PRINIVIL,ZESTRIL) 40 MG tablet Take 40 mg by mouth daily with supper.  09/15/14  Yes Historical Provider, MD  meclizine (ANTIVERT) 25 MG tablet Take 25 mg by mouth 3 (three) times daily as needed. 07/02/14  Yes Historical Provider, MD  montelukast (SINGULAIR) 10  MG tablet Take 10 mg by mouth daily with supper. 09/01/14  Yes Historical Provider, MD  pantoprazole (PROTONIX) 40 MG tablet Take 40 mg by mouth daily.     Yes Historical Provider, MD  potassium chloride SA (K-DUR,KLOR-CON) 20 MEQ tablet Take 20 mEq by mouth daily with supper.   Yes Historical Provider, MD  pravastatin (PRAVACHOL) 20 MG tablet Take 20 mg by mouth daily with supper.  08/23/14  Yes Historical Provider, MD  warfarin (COUMADIN) 2.5 MG tablet Take 2.5-3.75 mg by mouth daily with supper. Take 1 tablet (2.5  mg) by mouth on 1st day, then take 1 1/2 tablets (3.75 mg) on 2nd day, then repeat    Historical Provider, MD    Past Medical History: Past Medical History  Diagnosis Date  . HTN (hypertension)   . Dyslipidemia   . GERD (gastroesophageal reflux disease)   . Mitral regurgitation     on recent TEE 07/07/2010  . DJD (degenerative joint disease), cervical     s/p epidural steroid injections  . Hiatal hernia   . Osteopenia     frax neg 11/10, repeat in 2-4 years DJD C spine Dr. Ethelene Hal s/p epidural steroid injections   . Paroxysmal atrial fibrillation     on coumadin  . Diastolic dysfunction   . Rheumatic aortic stenosis     I will get an echo today to evaluate her murmur  . Heart murmur   . Diverticulitis     Past Surgical History: Past Surgical History  Procedure Laterality Date  . Total vaginal hysterectomy    . Cholecystectomy    . Left knee surgery    . Bilateral shoulder surgery    . Umbilical hernia repair x2    . Mitral valve repair  04/29/2010    26-mm Edwards annuloplasty ring and posterior commissure plasty, Dr Donata Clay   . Aortic valve replacement  04/29/2010    19 mm supra-annular pericardial tissue valve(serial number [id number]) Dr Donata Clay  . Cox-maze microwave ablation  04/29/2010    left sided, Dr Donata Clay  . Delayed sternal closure  04/30/2010    Dr Donata Clay  . Cardiac catheterization  04/27/2010    Dr Armanda Magic  . Cardioverted  05/19/2010    Dr Donato Schultz  . C5-c6 fusion    . Rf catheter ablation of afib  11/2009  . US echocardiography  07-2010, 05-04-2011    moderate AS of AVR, mo MR, mild pulm HTN RSVP 50-74mmHg, grade3, 05-04-2011 Echo normal LVF, stable MV ring with mild MS, mild LAE, mild PR, mod TRmoderate pulmonary HTN, prosthetic AVR with mild AS, grade 2 diastolic dysfunction  . Replacement ascending aorta N/A 09/23/2014    Procedure: REPAIR OF ASCENDING AORTIC PSEUDOANEURYSM;  Surgeon: Kerin Perna, MD;  Location: [facility] OR;  Service: Open Heart  Surgery;  Laterality: N/A;  Nitrous oxide  . Tee without cardioversion N/A 09/23/2014    Procedure: TRANSESOPHAGEAL ECHOCARDIOGRAM (TEE);  Surgeon: Kerin Perna, MD;  Location[facility] OR;  Service: Open Heart Surgery;  Laterality: N/A;    Family History: Family History  Problem Relation Age of Onset  . Diabetes type II    . Coronary artery disease Father     s/p cabg in the 1970's  . Heart disease    . Heart disease Father   . Heart attack Mother   . Hypertension Brother   . Hypertension Sister   . Parkinsonism Sister     Social History: Social History  Social History  . Marital Status: Single    Spouse Name: N/A  . Number of Children: 3  . Years of Education: N/A   Occupational History  . 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 part time at [employer]     Social History Main Topics  . Smoking status: Former Smoker    Types: Cigarettes  . Smokeless tobacco: None  . Alcohol Use: No  . Drug Use: No  . Sexual Activity: Not Asked   Other Topics Concern  . None   Social History Narrative    Allergies:  Allergies  Allergen Reactions  . Codeine Nausea And Vomiting  . Darvocet [Propoxyphene N-Acetaminophen] Nausea And Vomiting  . Iodine Other (See Comments)    Pt does not recall this reaction  . Lisinopril Other (See Comments)    Possible cough    Objective:    Vital Signs:   Temp:  [95.9 F (35.5 C)-98.6 F (37 C)] 98.6 F (37 C) (09/14 0800) Pulse Rate:  [42-112] 102 (09/14 0800) Resp:  [10-22] 19 (09/14 0800) BP: (64-127)/(39-81) 107/69 mmHg (09/14 0800) SpO2:  [98 %-100 %] 99 % (09/14 0800) Arterial Line BP: (85-156)/(50-82) 123/67 mmHg (09/14 0800) FiO2 (%):  [40 %-50 %] 40 % (09/14 0830) Weight:  [197 lb 12 oz (89.7 kg)] 197 lb 12 oz (89.7 kg) (09/14 0600) Last BM Date: 09/22/14  Weight change: Filed Weights   09/21/14 0700 09/22/14 0630 09/24/14 0600  Weight: 171 lb 8.3 oz (77.8 kg) 174 lb 13.2 oz (79.3 kg) 197 lb 12 oz (89.7 kg)     Intake/Output:   Intake/Output Summary (Last 24 hours) at 09/24/14 0932 Last data filed at 09/24/14 0800  Gross per 24 hour  Intake 7516.63 ml  Output   3660 ml  Net 3856.63 ml     Physical Exam: General:  Sedated, intubated. HEENT: Intubated Neck: supple. RIJ swan JVP elevated. Carotids 2+ bilat; no bruits. No lymphadenopathy or thryomegaly appreciated. Cor: PMI nondisplaced. Irregularly irregular. 2/6 TR Lungs: CTA anteriorly Abdomen: soft, nontender, nondistended. No hepatosplenomegaly. No bruits or masses. Good bowel sounds. Extremities: no cyanosis, clubbing, rash, mild edema. Right groin femoral A line, Left radial A line, Swan R neck. SCDs in place. GU: Foley Neuro: Sedated intubated  Telemetry:  Afib in 80s  Labs: Basic Metabolic Panel:  Recent Labs Lab 09/20/14 2000 09/22/14 0216 09/22/14 1615 09/23/14 0225  09/23/14 1029 09/23/14 1100 09/23/14 1205 09/23/14 1356 09/23/14 1948 09/23/14 2018 09/24/14 0415  NA 133* 132* 135 133*  < > 134* 134* 136 136 136  --  135  K 4.3 4.8 4.7 4.3  < > 4.1 4.4 3.7 3.5 4.2  --  4.2  CL 97* 98* 101 98*  < > 98* 99* 98*  --  101  --  103  CO2 27 26 23 24   --   --   --   --   --   --   --  21*  GLUCOSE 131* 138* 108* 98  < > 93 113* 122* 127* 121*  --  129*  BUN 21* 36* 40* 43*  < > 34* 33* 37*  --  32*  --  32*  CREATININE 1.21* 2.08* 1.97* 2.04*  < > 1.30* 1.40* 1.40*  --  1.50* 1.58* 1.66*  CALCIUM 8.5* 8.3* 8.5* 8.2*  --   --   --   --   --   --   --  7.0*  MG  --   --   --   --   --   --   --   --   --   --  3.0* 2.4  < > = values in this interval not displayed.  Liver Function Tests:  Recent Labs Lab 09/20/14 2000 09/22/14 0216 09/23/14 0225  AST 64* 308* 166*  ALT 48 149* 133*  ALKPHOS 122 147* 153*  BILITOT 1.0 0.5 0.9  PROT 6.4* 6.1* 6.2*  ALBUMIN 2.6* 2.6* 3.0*   No results for input(s): LIPASE, AMYLASE in the last 168 hours. No results for input(s): AMMONIA in the last 168 hours.  CBC:  Recent  Labs Lab 09/22/14 0216 09/23/14 0225  09/23/14 1055  09/23/14 1350 09/23/14 1356 09/23/14 1948 09/23/14 2018 09/24/14 0415  WBC 10.3 10.1  --   --   --  11.7*  --   --  12.7* 10.4  HGB 10.5* 10.4*  < > 8.0*  < > 8.6* 8.8* 9.2* 9.2* 9.2*  HCT 33.8* 33.0*  < > 24.7*  < > 26.3* 26.0* 27.0* 28.0* 27.4*  MCV 93.9 93.8  --   --   --  91.0  --   --  90.3 90.1  PLT 202 184  --  125*  --  117*  --   --  139* 121*  < > = values in this interval not displayed.  Cardiac Enzymes:  Recent Labs Lab 09/22/14 1615  CKTOTAL 101  CKMB 4.4  TROPONINI 12.55*    BNP: BNP (last 3 results) No results for input(s): BNP in the last 8760 hours.  ProBNP (last 3 results) No results for input(s): PROBNP in the last 8760 hours.   CBG:  Recent Labs Lab 09/23/14 2145 09/23/14 2252 09/23/14 2348 09/24/14 0409 09/24/14 0800  GLUCAP 103* 99 90 127* 114*    Coagulation Studies: No results for input(s): LABPROT, INR in the last 72 hours.  Other results:   Imaging: Dg Chest Port 1 View  09/24/2014   CLINICAL DATA:  Postop aortic aneurysm repair  EXAM: PORTABLE CHEST - 1 VIEW  COMPARISON:  09/23/2014  FINDINGS: Cardiomediastinal silhouette is stable. Right IJ Swan-Ganz catheter is unchanged in position. Stable right IJ catheter position. Stable endotracheal and NG tube position. Status post median sternotomy. No pulmonary edema. Persistent left basilar atelectasis or infiltrate. Mild linear atelectasis right perihilar. Skin staples are noted right anterior chest wall.  IMPRESSION: Stable support apparatus. No pulmonary edema. Status post median sternotomy. Left basilar atelectasis or infiltrate.   Electronically Signed   By: Natasha Mead M.D.   On: 09/24/2014 08:11   Dg Chest Port 1 View  09/23/2014   CLINICAL DATA:  Postoperative ascending aortic pseudoaneurysm repair.  EXAM: PORTABLE CHEST - 1 VIEW  COMPARISON:  09/22/2014  FINDINGS: Cardiomegaly and prominence superior mediastinum noted. Median  sternotomy and cardiac valve replacement changes again identified.  An endotracheal tube with tip 3 cm above the carina, right IJ Swan-Ganz catheter with tip overlying the main pulmonary artery, NG tube entering the stomach with tip off the field of view, and right IJ central venous catheter with tip overlying the superior cavoatrial junction noted. Mediastinal tubes are identified.  Pulmonary vascular congestion and scattered subsegmental atelectasis bilaterally noted.  There is no evidence of pneumothorax.  IMPRESSION: Postoperative changes with scattered subsegmental atelectasis and support apparatus as described. No evidence of pneumothorax.   Electronically Signed   By: Harmon Pier M.D.   On: 09/23/2014 14:06      Medications:     Current Medications: . acetaminophen  1,000 mg Oral 4 times per day   Or  . acetaminophen (TYLENOL) oral liquid 160  mg/5 mL  1,000 mg Per Tube 4 times per day  . aspirin EC  325 mg Oral Daily   Or  . aspirin  324 mg Per Tube Daily  . bisacodyl  10 mg Oral Daily   Or  . bisacodyl  10 mg Rectal Daily  . cefUROXime (ZINACEF)  IV  1.5 g Intravenous Q12H  . docusate sodium  200 mg Oral Daily  . furosemide  40 mg Intravenous BID  . insulin aspart  0-24 Units Subcutaneous 6 times per day  . insulin regular  0-10 Units Intravenous TID WC  . metoCLOPramide (REGLAN) injection  10 mg Intravenous 4 times per day  . metoprolol tartrate  12.5 mg Oral BID   Or  . metoprolol tartrate  12.5 mg Per Tube BID  . [START ON 09/25/2014] pantoprazole  40 mg Oral Daily  . sodium chloride  3 mL Intravenous Q12H  . vancomycin  1,000 mg Intravenous Q24H     Infusions: . sodium chloride 20 mL/hr at 09/24/14 0400  . sodium chloride    . sodium chloride 20 mL/hr at 09/24/14 0400  . amiodarone 30 mg/hr (09/24/14 0400)  . dexmedetomidine 1 mcg/kg/hr (09/24/14 0740)  . EPINEPHrine 4 mg in dextrose 5% 250 mL infusion (16 mcg/mL)    . insulin (NOVOLIN-R) infusion Stopped (09/23/14  2300)  . lactated ringers Stopped (09/23/14 2300)  . lactated ringers Stopped (09/23/14 2000)  . milrinone 0.3 mcg/kg/min (09/24/14 0705)  . nitroGLYCERIN Stopped (09/23/14 1715)  . norepinephrine (LEVOPHED) Adult infusion Stopped (09/24/14 0515)  . phenylephrine (NEO-SYNEPHRINE) Adult infusion Stopped (09/23/14 1437)      Assessment/Plan   1. 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 H/o AVR (bioprosthesis) and MV ring 2012 3. 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 AKI 5. PAF s/p MAZE 2010 6. Acute respiratory failure  Currently sedated on nitric oxide for Pulmonary HTN and RV dysfunction. Plans are to wean and attempt extubation this am/early afternoon.  Coumadin held for procedure. Resumption per Dr Donata Clay. Currently in a fib on tele.    Increase milrinone to 0.375 mcg. Add sildenafil 20 mg TID.  Length of Stay: 4  Graciella Freer PA-C 09/24/2014, 9:32 AM  Advanced Heart Failure Team Pager [phone] (M-F; 7a - 4p)  Please contact [facility] for night-coverage after hours (4p -7a ) and weekends on [email or web address]  Patient seen and examined with Otilio Saber, PA-C. We discussed all aspects of the encounter. I agree with the assessment and plan as stated above.   Echo images reviewed personally and swan numbers measured at bedside. She has moderate PAH with significant RV strain with cor pulmonale.  She is stable on milrinone and inhaled NO. PVR now 6.3 WU. Wedge ok. Will increase milrinone to 0.375. Add revatio 20 tid. Follow PA pressures as NO is weaned. Can titrate revatio aggressively if needed. We will follow.   The patient is critically ill with multiple organ systems failure and requires high complexity decision making for assessment and support, frequent evaluation and titration of therapies, application of advanced monitoring technologies and extensive interpretation of multiple databases.   Critical Care Time devoted  to patient care services described in this note is 45 Minutes.  Devarious Pavek,MD 10:47 AM

## 2014-09-24 NOTE — Op Note (Signed)
[name], [name] NO.:  [id number]  MEDICAL RECORD NO.:  [id number]  LOCATION:  2S13C                        FACILITY:  [facility]  PHYSICIAN:  Kerin Perna, M.D.  DATE OF BIRTH:  1940-02-15  DATE OF PROCEDURE:  09/23/2014 DATE OF DISCHARGE:                              OPERATIVE REPORT   OPERATION: 1. Redo sternotomy. 2. Right axillary cannulation. 3. Repair of pseudoaneurysm of ascending aorta 4. Placement of femoral A-line for blood pressure monitoring.  SURGEON:  Kerin Perna, M.D.  ASSISTANT:  Pauline Good PA-C.  PREOPERATIVE DIAGNOSIS:  Pseudoaneurysm of the ascending aorta, status post previous aortic valve replacement and mitral valve repair and maze procedure in 2012.  POSTOPERATIVE DIAGNOSIS:  Pseudoaneurysm of the ascending aorta, status post previous aortic valve replacement and mitral valve repair and maze procedure in 2012.  CLINICAL NOTE:  The patient is a fragile 74 year old female with 3 weeks of cough, left shoulder and chest discomfort who presented to her local cardiologist in [place].  Cardiac enzymes were positive and she was sent to the hospital in [place].  Cardiac catheterization and echocardiogram were performed.  Cardiac catheterization showed normal coronaries.  Echocardiogram showed normal functioning of her bioprosthetic aortic valve for aortic insufficiency and normal function of her mitral valve repair without mitral regurgitation or stenosis. Her RV was dilated and hypocontractile and there was moderate TR.  She had a large hematoma anterior to the right ventricle.  A followup CTA of the thoracic aorta showed this to be a 7 cm false aneurysm of fresh thrombus on the RV outflow tract and pulmonary artery in the medial aspect of the ascending aorta.  This was felt to arise from a Dacron patch repair of her ascending aorta, which was required to replace some pledgeted sutures which tore through the aorta to close  her cardioplegia cannulation site.  Prior to surgery, I discussed the results of her cardiac cath, echo, and CTA in detail with the patient and her daughter.  I discussed the indications and expected benefits of redo sternotomy and repair of this false aneurysm.  She understood that without surgical repair of the false aneurysm, this would be ultimately fatal.  I reviewed the CTA of the thoracic aorta and false aneurysm with Dr. Tyrone Sage to discuss the possibility of a TVAR stent graft repair.  However, he felt that there was not enough distance on the ends of the ascending aorta for a short stent graft to be seated safely.  Open repair was recommended.  I discussed the risks of the operation with the patient including the risks of bleeding, blood transfusion, stroke, MI, multisystem organ failure, death.  She understood and agreed to proceed with surgery.  OPERATIVE FINDINGS:  There was severe scarring and almost concrete like adhesions in the mediastinum from her previous operation which required leaving the sternum open for a few days to allow RV function to recover. The leak was found to be on the medial aspect of the circular Dacron patch on the ascending aorta.  This was a fresh thrombus.  The hematoma was cleaned out and relieved pressure on the RV outflow tract.  Some of the hematoma was sent  for stat Gram stain and cultures.  Stat Gram stain showed no signs of infection, no organisms or WBCs.  The replacement of the entire aorta would have been high risk with little chance of survival.  I repaired the area of leak where the sutures tore through the aorta in combination with a long-term anticoagulation because of the continued bleed.  Interrupted pledgeted sutures were used.  This was performed on cardiopulmonary bypass with peripheral cannulation without cardioplegia arrest or circulatory arrest.  DESCRIPTION OF PROCEDURE:  The patient was brought to the operating room and  placed supine on the operating table where general anesthesia was induced under invasive hemodynamic monitoring.  A transesophageal echo probe was placed by the anesthesiologist.  It showed both the aortic valve and the mitral valve to be functioning well.  There was moderate TR and the RV was dilated.  The patient was started on inhaled nitric oxide with improvement of the RV function.  Invasive monitoring lines had been placed.  The patient was prepped and draped as a sterile field. A proper time-out was performed.  First, a left femoral A-line was placed for blood pressure monitoring.  Next a 5-French micro sheath was placed in the right femoral vein for percutaneous venous cannulation.  Next, an incision was made beneath the right clavicle and the pectoralis muscle was divided in the direction of its fibers and retracted.  The pectoralis minor was divided.  The axillary artery was identified and encircled with a vessel loop.  Heparin was administered.  Vascular clamps were placed proximally and distally in the graft-cannula.  The device was sewn end-to-side with running 5-0 Prolene into the axillary artery and the suture line was reinforced with CoSeal.  This was clamped and placed above the sternal incision.  Next, the previous sternal incision was opened and the sternal wires removed.  Next, full-dose heparin was given for bypass.  Using the 5-French micro sheath in the right femoral vein, a guidewire was passed to the superior vena cava and confirmed by TEE.  Over this wire, dilators and a 23-25- [place] dual stage venous cannula was placed to provide venous return for cardiopulmonary bypass.  Both cannulae were connected to the pump circuit and when the ACT was documented as being therapeutic, cardiopulmonary bypass was started.  The sternotomy was performed using the oscillating saw while on bypass.  Almost immediately, a false aneurysm was encountered underneath  the sternum.  On bypass, this was decompressed.  It was 5-7 cm in diameter and full of fresh thrombus.  Cleaning this up allowed identification of the ascending aorta, the previous aortic cannulation stitch, and the aortotomy closure.  The graft used to repair the anterior surface of the ascending aorta was identified and there was a small pinhole area of blood streaming out once all the clot was removed.  This was repaired with 2 interrupted 4-0 pledgeted sutures carefully placed to incorporate the adventitious layer of the aorta as well as the previously placed Dacron graft.  This stopped the bleeding.  Cultures of the clot-hematoma in this area were sent for stat Gram stain, which returned negative for signs of infection.  Other material sent from the aortic tissue for cultures.  The area was irrigated with warm vancomycin.  A temporary pacing wire was placed on the surface of the right ventricle.  There were severe concrete like adhesions above and below the ascending aorta.  The heart could not even be identified except for a small portion of the  right ventricle at the lower part of the sternal incision for the pacing wire.  I felt that replacement of her ascending aorta with a cylindrical Dacron graft would not be survivable.  I felt that the repair with sutures was secure and was her best chance for long-term survival.  We then warmed the patient back on bypass.  We placed the temporary epicardial pacing wire on the right ventricle.  We resumed ventilation and added nitric oxide to the ventilator circuit.  Low-dose inotropes were started.  The patient was weaned off cardiopulmonary bypass.  Echo showed the RV function to be improved with left dilatation.  Protamine was administered.  While there was no adverse reaction, there was still significant bleeding from the mediastinal dissection of this redo sternotomy.  Platelets and FFP were administered with improvement  in coagulation.  I placed two soft drains in the anterior mediastinum and brought out through separate incisions.  Next, the graft to the axillary artery was stapled, oversewn, and that incision was closed in layers using interrupted Vicryl and skin staples on the skin.  Next, the sternum was closed with interrupted steel wires.  The patient remained stable.  Hemodynamics were good.  The pectoralis fascia was closed with a running #1 Vicryl.  The subcutaneous and skin layers were closed with running Vicryl.  The two-stage percutaneous venous cannula had been removed from the groin after protamine was started.  This was closed with some interrupted Vicryl stitch.  The chest tubes connected to the Pleur-Evac.  Sterile dressings were applied.  The patient returned to recovery room in critical but stable condition.     Kerin Perna, M.D.     PV/MEDQ  D:  09/23/2014  T:  09/24/2014  Job:  [id number]

## 2014-09-24 NOTE — Progress Notes (Signed)
1 Day Post-Op Procedure(s) (LRB): REDO STERNOTOMY (N/A) REPAIR OF ASCENDING AORTIC PSEUDOANEURYSM (N/A) TRANSESOPHAGEAL ECHOCARDIOGRAM (TEE) (N/A) Subjective: Sedated on inhaled nitric oxide for pulmonary HTN and RV dysfunction Stable hemodynamics on mil and low dose epi for RV Min chest tube output preop-postop acute  renal insufficiency  Objective: Vital signs in last 24 hours: Temp:  [95.9 F (35.5 C)-98.6 F (37 C)] 98.6 F (37 C) (09/14 0730) Pulse Rate:  [42-112] 91 (09/14 0730) Cardiac Rhythm:  [-] Atrial fibrillation (09/14 0400) Resp:  [10-22] 12 (09/14 0730) BP: (64-127)/(39-81) 109/64 mmHg (09/14 0700) SpO2:  [98 %-100 %] 99 % (09/14 0730) Arterial Line BP: (85-156)/(50-82) 118/65 mmHg (09/14 0730) FiO2 (%):  [40 %-50 %] 40 % (09/14 0420) Weight:  [197 lb 12 oz (89.7 kg)] 197 lb 12 oz (89.7 kg) (09/14 0600)  Hemodynamic parameters for last 24 hours: PAP: (44-68)/(27-39) 59/31 mmHg CVP:  [14 mmHg-26 mmHg] 18 mmHg CO:  [2.8 L/min-3.7 L/min] 3.7 L/min CI:  [1.5 L/min/m2-2.1 L/min/m2] 2.1 L/min/m2  Intake/Output from previous day: 09/13 0701 - 09/14 0700 In: 8516.6 [I.V.:4919.6; WUJWJ:1914; NG/GT:180; IV Piggyback:1050] Out: 3600 [Urine:1260; Emesis/NG output:200; Blood:1950; Chest Tube:190] Intake/Output this shift:    Responsive on vent No rub, 2/6 murmur TR Warm feet Lab Results:  Recent Labs  09/23/14 2018 09/24/14 0415  WBC 12.7* 10.4  HGB 9.2* 9.2*  HCT 28.0* 27.4*  PLT 139* 121*   BMET:  Recent Labs  09/23/14 0225  09/23/14 1948 09/23/14 2018 09/24/14 0415  NA 133*  < > 136  --  135  K 4.3  < > 4.2  --  4.2  CL 98*  < > 101  --  103  CO2 24  --   --   --  21*  GLUCOSE 98  < > 121*  --  129*  BUN 43*  < > 32*  --  32*  CREATININE 2.04*  < > 1.50* 1.58* 1.66*  CALCIUM 8.2*  --   --   --  7.0*  < > = values in this interval not displayed.  PT/INR: No results for input(s): LABPROT, INR in the last 72 hours. ABG    Component Value  Date/Time   PHART 7.343* 09/24/2014 0411   HCO3 20.7 09/24/2014 0411   TCO2 22 09/24/2014 0411   ACIDBASEDEF 5.0* 09/24/2014 0411   O2SAT 99.0 09/24/2014 0411   CBG (last 3)   Recent Labs  09/23/14 2348 09/24/14 0409 09/24/14 0800  GLUCAP 90 127* 114*    Assessment/Plan: S/P Procedure(s) (LRB): REDO STERNOTOMY (N/A) REPAIR OF ASCENDING AORTIC PSEUDOANEURYSM (N/A) TRANSESOPHAGEAL ECHOCARDIOGRAM (TEE) (N/A) Wean nitric then extubate- follow RV paremeters Adv HF consult for longstanding pul HTN, RV dysfx and mod TR No coumadin- LA appendage previously ligated   LOS: 4 days    Ashley [name] 09/24/2014

## 2014-09-24 NOTE — Progress Notes (Signed)
CT surgery p.m. Rounds  Patient successfully extubated after nitric oxide weaned to 3 ppm on nasal cannula  We'll wean off epinephrine and continue milrinone and low-dose norepinephrine.  Pulmonary status stable, neuro intact Leave PA catheter in this evening

## 2014-09-24 NOTE — Procedures (Signed)
Extubation Procedure Note  Patient Details:   Name: Ashley [name] DOB: [birth year] MRN: [id number]   Airway Documentation:     Evaluation  O2 sats: stable throughout Complications: No apparent complications Patient did tolerate procedure well. Bilateral Breath Sounds: Clear, Diminished Suctioning: Airway Yes Remains on NO at 3ppm, with 5l/min [name], [name] 09/24/2014, 6:05 PM

## 2014-09-24 NOTE — Progress Notes (Signed)
Initial Nutrition Assessment  DOCUMENTATION CODES:   Obesity unspecified  INTERVENTION:   If patient remains intubated for >/= 48 hours recommend starting nutrition.  Recommend Vital High Protein @ 25 ml/hr via OGT and increase by 10 ml every 4 hours to goal rate of 35 ml/hr with 30 ml Prostat BID to provide 1040 kcal, 104 grams of protein, and 706 ml of H2O.   RD to continue to monitor.   NUTRITION DIAGNOSIS:   Inadequate oral intake related to inability to eat as evidenced by NPO status.  GOAL:   Provide needs based on ASPEN/SCCM guidelines  MONITOR:   Vent status, Weight trends, Labs, I & O's  REASON FOR ASSESSMENT:   Ventilator    ASSESSMENT:   74 year old woman with hypertension, dyslipidemia, PAF on coumadin and valvular heart disease who underwent mitral valve repair, MAZE procedure, and AVR with a 19 mm pericardial valve in 04/2010. she developed upper substernal chest pain and cough. This has worsened and she was admitted to [facility] in [place] last Thursday. She underwent a cardiac cath there that reportedly showed no coronary disease. 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     Procedure (9/13): REDO STERNOTOMY (N/A) REPAIR OF ASCENDING AORTIC PSEUDOANEURYSM (N/A) TRANSESOPHAGEAL ECHOCARDIOGRAM (TEE) (N/A)  Patient is currently intubated on ventilator support MV: 6.5 L/min Temp (24hrs), Avg:97.7 F (36.5 C), Min:95.9 F (35.5 C), Max:98.8 F (37.1 C)  Propofol: none  No family at bedside. Unable to obtain nutrition hx at this time. Noted, weight has trended up since admission.   Pt with no observed significant fat or muscle mass loss.  Labs: Low CO2, calcium, and GFR. High BUN and creatinine.   Diet Order:  Diet NPO time specified  Skin:   (Incision on chest, +1  generalized edema)  Last BM:  9/12  Height:   Ht Readings from Last 1 Encounters:  09/20/14  (1.6 m)    Weight:   Wt Readings from Last 1 Encounters:  09/24/14 197 lb 12 oz (89.7 kg)  Admit wt (9/10) 172 lbs (78.18 kg)  Ideal Body Weight:  52.27 kg  BMI:  Body mass index is 35.04 kg/(m^2).  Estimated Nutritional Needs:   Kcal:  [phone]  Protein:  105-115 grams  Fluid:  Per MD  EDUCATION NEEDS:   No education needs identified at this time  Roslyn Smiling, MS, RD, LDN Pager # [phone] After hours/ weekend pager # [phone]

## 2014-09-25 ENCOUNTER — Inpatient Hospital Stay (HOSPITAL_COMMUNITY): Payer: Medicare Other

## 2014-09-25 DIAGNOSIS — I2609 Other pulmonary embolism with acute cor pulmonale: Secondary | ICD-10-CM | POA: Insufficient documentation

## 2014-09-25 DIAGNOSIS — I2721 Secondary pulmonary arterial hypertension: Secondary | ICD-10-CM | POA: Insufficient documentation

## 2014-09-25 LAB — TYPE AND SCREEN
ABO/RH(D): A POS
Antibody Screen: NEGATIVE
Unit division: 0
Unit division: 0
Unit division: 0
Unit division: 0

## 2014-09-25 LAB — CBC
HCT: 27 % — ABNORMAL LOW (ref 36.0–46.0)
Hemoglobin: 8.7 g/dL — ABNORMAL LOW (ref 12.0–15.0)
MCH: 29.3 pg (ref 26.0–34.0)
MCHC: 32.2 g/dL (ref 30.0–36.0)
MCV: 90.9 fL (ref 78.0–100.0)
Platelets: 124 10*3/uL — ABNORMAL LOW (ref 150–400)
RBC: 2.97 MIL/uL — ABNORMAL LOW (ref 3.87–5.11)
RDW: 15.3 % (ref 11.5–15.5)
WBC: 9.7 10*3/uL (ref 4.0–10.5)

## 2014-09-25 LAB — COMPREHENSIVE METABOLIC PANEL
ALT: 52 U/L (ref 14–54)
AST: 46 U/L — ABNORMAL HIGH (ref 15–41)
Albumin: 2.7 g/dL — ABNORMAL LOW (ref 3.5–5.0)
Alkaline Phosphatase: 93 U/L (ref 38–126)
Anion gap: 11 (ref 5–15)
BUN: 25 mg/dL — ABNORMAL HIGH (ref 6–20)
CO2: 20 mmol/L — ABNORMAL LOW (ref 22–32)
Calcium: 7.4 mg/dL — ABNORMAL LOW (ref 8.9–10.3)
Chloride: 103 mmol/L (ref 101–111)
Creatinine, Ser: 1.49 mg/dL — ABNORMAL HIGH (ref 0.44–1.00)
GFR calc Af Amer: 39 mL/min — ABNORMAL LOW (ref >60)
GFR calc non Af Amer: 33 mL/min — ABNORMAL LOW (ref >60)
Glucose, Bld: 118 mg/dL — ABNORMAL HIGH (ref 65–99)
Potassium: 3.5 mmol/L (ref 3.5–5.1)
Sodium: 134 mmol/L — ABNORMAL LOW (ref 135–145)
Total Bilirubin: 0.8 mg/dL (ref 0.3–1.2)
Total Protein: 5.2 g/dL — ABNORMAL LOW (ref 6.5–8.1)

## 2014-09-25 LAB — WOUND CULTURE
Culture: NO GROWTH
Culture: NO GROWTH
Gram Stain: NONE SEEN

## 2014-09-25 LAB — GLUCOSE, CAPILLARY
GLUCOSE-CAPILLARY: 108 mg/dL — AB (ref 65–99)
GLUCOSE-CAPILLARY: 118 mg/dL — AB (ref 65–99)
GLUCOSE-CAPILLARY: 133 mg/dL — AB (ref 65–99)
GLUCOSE-CAPILLARY: 101 mg/dL — AB (ref 65–99)
Glucose-Capillary: 107 mg/dL — ABNORMAL HIGH (ref 65–99)
Glucose-Capillary: 120 mg/dL — ABNORMAL HIGH (ref 65–99)

## 2014-09-25 LAB — POCT I-STAT, CHEM 8
BUN: 22 mg/dL — ABNORMAL HIGH (ref 6–20)
CREATININE: 1.2 mg/dL — AB (ref 0.44–1.00)
Calcium, Ion: 1 mmol/L — ABNORMAL LOW (ref 1.13–1.30)
Chloride: 113 mmol/L — ABNORMAL HIGH (ref 101–111)
Glucose, Bld: 139 mg/dL — ABNORMAL HIGH (ref 65–99)
HEMATOCRIT: 27 % — AB (ref 36.0–46.0)
HEMOGLOBIN: 9.2 g/dL — AB (ref 12.0–15.0)
POTASSIUM: 3.7 mmol/L (ref 3.5–5.1)
Sodium: 130 mmol/L — ABNORMAL LOW (ref 135–145)
TCO2: 21 mmol/L (ref 0–100)

## 2014-09-25 LAB — CARBOXYHEMOGLOBIN
Carboxyhemoglobin: 0.9 % (ref 0.5–1.5)
Methemoglobin: 0.8 % (ref 0.0–1.5)
O2 Saturation: 57.8 %
Total hemoglobin: 8.8 g/dL — ABNORMAL LOW (ref 12.0–16.0)

## 2014-09-25 LAB — POCT I-STAT GLUCOSE
GLUCOSE: 93 mg/dL (ref 65–99)
OPERATOR ID: 147011

## 2014-09-25 MED ORDER — ASPIRIN EC 81 MG PO TBEC
81.0000 mg | DELAYED_RELEASE_TABLET | Freq: Every day | ORAL | Status: DC
  Administered 2014-09-25 – 2014-10-06 (×12): 81 mg via ORAL
  Filled 2014-09-25 (×13): qty 1

## 2014-09-25 MED ORDER — AMIODARONE HCL 200 MG PO TABS
200.0000 mg | ORAL_TABLET | Freq: Two times a day (BID) | ORAL | Status: DC
  Administered 2014-09-25 – 2014-09-26 (×3): 200 mg via ORAL
  Filled 2014-09-25 (×4): qty 1

## 2014-09-25 MED ORDER — INSULIN DETEMIR 100 UNIT/ML ~~LOC~~ SOLN
10.0000 [IU] | Freq: Two times a day (BID) | SUBCUTANEOUS | Status: DC
  Filled 2014-09-25: qty 0.1

## 2014-09-25 MED ORDER — FUROSEMIDE 10 MG/ML IJ SOLN
80.0000 mg | Freq: Two times a day (BID) | INTRAMUSCULAR | Status: DC
  Administered 2014-09-25 – 2014-09-26 (×3): 80 mg via INTRAVENOUS
  Filled 2014-09-25 (×4): qty 8

## 2014-09-25 MED ORDER — INSULIN ASPART 100 UNIT/ML ~~LOC~~ SOLN
0.0000 [IU] | SUBCUTANEOUS | Status: DC
  Administered 2014-09-25 – 2014-10-01 (×12): 2 [IU] via SUBCUTANEOUS
  Administered 2014-10-01: 4 [IU] via SUBCUTANEOUS
  Administered 2014-10-01 – 2014-10-02 (×2): 2 [IU] via SUBCUTANEOUS

## 2014-09-25 MED ORDER — SILDENAFIL CITRATE 20 MG PO TABS
60.0000 mg | ORAL_TABLET | Freq: Three times a day (TID) | ORAL | Status: DC
  Administered 2014-09-25 – 2014-10-02 (×22): 60 mg via ORAL
  Filled 2014-09-25 (×28): qty 3

## 2014-09-25 MED ORDER — POTASSIUM CHLORIDE 10 MEQ/50ML IV SOLN
10.0000 meq | INTRAVENOUS | Status: AC
  Administered 2014-09-25 (×3): 10 meq via INTRAVENOUS
  Filled 2014-09-25: qty 50

## 2014-09-25 MED ORDER — METOCLOPRAMIDE HCL 5 MG/ML IJ SOLN
10.0000 mg | Freq: Four times a day (QID) | INTRAMUSCULAR | Status: DC
  Administered 2014-09-25 – 2014-10-04 (×29): 10 mg via INTRAVENOUS
  Filled 2014-09-25 (×37): qty 2

## 2014-09-25 MED ORDER — DIGOXIN 0.25 MG/ML IJ SOLN
0.1250 mg | Freq: Every day | INTRAMUSCULAR | Status: DC
  Administered 2014-09-25 – 2014-10-03 (×9): 0.125 mg via INTRAVENOUS
  Filled 2014-09-25 (×10): qty 0.5

## 2014-09-25 MED ORDER — HYDROCOD POLST-CPM POLST ER 10-8 MG/5ML PO SUER
5.0000 mL | Freq: Two times a day (BID) | ORAL | Status: DC
  Administered 2014-09-25 – 2014-09-28 (×8): 5 mL via ORAL
  Filled 2014-09-25 (×8): qty 5

## 2014-09-25 MED ORDER — POTASSIUM CHLORIDE 10 MEQ/50ML IV SOLN
10.0000 meq | INTRAVENOUS | Status: AC
  Administered 2014-09-25 (×3): 10 meq via INTRAVENOUS
  Filled 2014-09-25 (×3): qty 50

## 2014-09-25 MED ORDER — INFLUENZA VAC SPLIT QUAD 0.5 ML IM SUSY
0.5000 mL | PREFILLED_SYRINGE | INTRAMUSCULAR | Status: DC
  Filled 2014-09-25 (×3): qty 0.5

## 2014-09-25 MED ORDER — SODIUM BICARBONATE 8.4 % IV SOLN
25.0000 meq | Freq: Once | INTRAVENOUS | Status: AC
  Administered 2014-09-25: 25 meq via INTRAVENOUS
  Filled 2014-09-25: qty 50

## 2014-09-25 NOTE — Care Management Important Message (Signed)
Important Message  Patient Details  Name: Ashley [name] MRN: [id number] Date of Birth: 1940/08/12   Medicare Important Message Given:  Yes-second notification given    Kyla Balzarine 09/25/2014, 3:44 PM

## 2014-09-25 NOTE — Progress Notes (Signed)
Advanced Heart Failure Rounding Note  Primary Physician: Dr Karsten Fells Primary Cardiologist: Dr Armanda Magic previously. Primary HF: Bensimhon  Subjective:    Successfully extubated 9/14 after weaning of NO.  Feels ok today.  Still very sore from surgery.  No CP or SOB.  + 0.7 L on 80 mg IV lasix BID and 0.375 mcg/min of milrinone. Co-ox 57.8%  Swan Numbers performed bedside by Dr Gala Romney CVP 21 PAP 64/33 (45) CO 3.79 CI 2.09 Wedge 18 PVR 527  Objective:   Weight Range: 194 lb 7.1 oz (88.2 kg) Body mass index is 34.45 kg/(m^2).   Vital Signs:   Temp:  [97.7 F (36.5 C)-98.8 F (37.1 C)] 97.9 F (36.6 C) (09/15 0800) Pulse Rate:  [25-147] 117 (09/15 0800) Resp:  [12-24] 16 (09/15 0800) BP: (79-129)/(47-79) 109/74 mmHg (09/15 0800) SpO2:  [95 %-99 %] 99 % (09/15 0800) Arterial Line BP: (81-141)/(47-83) 129/73 mmHg (09/15 0800) FiO2 (%):  [40 %] 40 % (09/14 1719) Weight:  [194 lb 7.1 oz (88.2 kg)] 194 lb 7.1 oz (88.2 kg) (09/15 0600) Last BM Date: 09/22/14  Weight change: Filed Weights   09/22/14 0630 09/24/14 0600 09/25/14 0600  Weight: 174 lb 13.2 oz (79.3 kg) 197 lb 12 oz (89.7 kg) 194 lb 7.1 oz (88.2 kg)    Intake/Output:   Intake/Output Summary (Last 24 hours) at 09/25/14 1610 Last data filed at 09/25/14 0800  Gross per 24 hour  Intake 2871.82 ml  Output   1915 ml  Net 956.82 ml     Physical Exam: General: NAD HEENT: Normal Neck: supple. RIJ swan. JVP elevated. Carotids 2+ bilat; no bruits. No lymphadenopathy or thryomegaly. Cor: PMI nondisplaced. Irregularly irregular. 2/6 TR Lungs: CTA anteriorly Abdomen: soft, nontender, nondistended. No HSM. No bruits or masses. +BS Extremities: no cyanosis, clubbing, rash. 1+ edema. Radial A line, Swan R neck. SCDs in place. GU: Foley Neuro: Moves all 4 extremities without difficulty. No focal deficits. Affect flat but appropriate   Telemetry: Afib 100-110s  Labs: CBC  Recent Labs   09/24/14 0415 09/24/14 1626 09/25/14 0423  WBC 10.4  --  9.7  HGB 9.2* 8.5* 8.7*  HCT 27.4* 25.0* 27.0*  MCV 90.1  --  90.9  PLT 121*  --  124*   Basic Metabolic Panel  Recent Labs  09/24/14 0415 09/24/14 1625 09/24/14 1626 09/25/14 0423  NA 135  --  137 134*  K 4.2  --  4.0 3.5  CL 103  --  104 103  CO2 21*  --   --  20*  GLUCOSE 129*  --  138* 118*  BUN 32*  --  28* 25*  CALCIUM 7.0*  --   --  7.4*  MG 2.4 2.2  --   --    Liver Function Tests  Recent Labs  09/23/14 0225 09/25/14 0423  AST 166* 46*  ALT 133* 52  ALKPHOS 153* 93  BILITOT 0.9 0.8  PROT 6.2* 5.2*  ALBUMIN 3.0* 2.7*   No results for input(s): LIPASE, AMYLASE in the last 72 hours. Cardiac Enzymes  Recent Labs  09/22/14 1615  CKTOTAL 101  CKMB 4.4  TROPONINI 12.55*    BNP: BNP (last 3 results) No results for input(s): BNP in the last 8760 hours.  ProBNP (last 3 results) No results for input(s): PROBNP in the last 8760 hours.   D-Dimer No results for input(s): DDIMER in the last 72 hours. Hemoglobin A1C  Recent Labs  09/22/14 1615  HGBA1C 6.8*  Fasting Lipid Panel No results for input(s): CHOL, HDL, LDLCALC, TRIG, CHOLHDL, LDLDIRECT in the last 72 hours. Thyroid Function Tests No results for input(s): TSH, T4TOTAL, T3FREE, THYROIDAB in the last 72 hours.  Invalid input(s): FREET3  Other results:     Imaging/Studies:  Dg Chest Port 1 View  09/24/2014   CLINICAL DATA:  Postop aortic aneurysm repair  EXAM: PORTABLE CHEST - 1 VIEW  COMPARISON:  09/23/2014  FINDINGS: Cardiomediastinal silhouette is stable. Right IJ Swan-Ganz catheter is unchanged in position. Stable right IJ catheter position. Stable endotracheal and NG tube position. Status post median sternotomy. No pulmonary edema. Persistent left basilar atelectasis or infiltrate. Mild linear atelectasis right perihilar. Skin staples are noted right anterior chest wall.  IMPRESSION: Stable support apparatus. No pulmonary  edema. Status post median sternotomy. Left basilar atelectasis or infiltrate.   Electronically Signed   By: Natasha Mead M.D.   On: 09/24/2014 08:11   Dg Chest Port 1 View  09/23/2014   CLINICAL DATA:  Postoperative ascending aortic pseudoaneurysm repair.  EXAM: PORTABLE CHEST - 1 VIEW  COMPARISON:  09/22/2014  FINDINGS: Cardiomegaly and prominence superior mediastinum noted. Median sternotomy and cardiac valve replacement changes again identified.  An endotracheal tube with tip 3 cm above the carina, right IJ Swan-Ganz catheter with tip overlying the main pulmonary artery, NG tube entering the stomach with tip off the field of view, and right IJ central venous catheter with tip overlying the superior cavoatrial junction noted. Mediastinal tubes are identified.  Pulmonary vascular congestion and scattered subsegmental atelectasis bilaterally noted.  There is no evidence of pneumothorax.  IMPRESSION: Postoperative changes with scattered subsegmental atelectasis and support apparatus as described. No evidence of pneumothorax.   Electronically Signed   By: Harmon Pier M.D.   On: 09/23/2014 14:06     Latest Echo  Latest Cath   Medications:     Scheduled Medications: . acetaminophen  1,000 mg Oral 4 times per day   Or  . acetaminophen (TYLENOL) oral liquid 160 mg/5 mL  1,000 mg Per Tube 4 times per day  . amiodarone  200 mg Oral BID  . antiseptic oral rinse  7 mL Mouth Rinse QID  . aspirin EC  81 mg Oral Daily  . bisacodyl  10 mg Oral Daily   Or  . bisacodyl  10 mg Rectal Daily  . cefUROXime (ZINACEF)  IV  1.5 g Intravenous Q12H  . chlorhexidine gluconate  15 mL Mouth Rinse BID  . digoxin  0.125 mg Intravenous Daily  . docusate sodium  200 mg Oral Daily  . furosemide  80 mg Intravenous BID  . insulin aspart  0-24 Units Subcutaneous 6 times per day  . insulin regular  0-10 Units Intravenous TID WC  . metoCLOPramide (REGLAN) injection  10 mg Intravenous 4 times per day  . metoprolol tartrate   12.5 mg Oral BID   Or  . metoprolol tartrate  12.5 mg Per Tube BID  . pantoprazole  40 mg Oral Daily  . potassium chloride  10 mEq Intravenous Q1 Hr x 3  . sildenafil  20 mg Oral TID  . sodium chloride  3 mL Intravenous Q12H  . vancomycin  1,000 mg Intravenous Q24H     Infusions: . sodium chloride 20 mL/hr at 09/25/14 0800  . sodium chloride    . sodium chloride 20 mL/hr at 09/25/14 0800  . lactated ringers Stopped (09/23/14 2300)  . lactated ringers Stopped (09/23/14 2000)  . milrinone 0.375 mcg/kg/min (  09/25/14 0800)  . nitroGLYCERIN Stopped (09/23/14 1715)  . norepinephrine (LEVOPHED) Adult infusion Stopped (09/25/14 0615)  . phenylephrine (NEO-SYNEPHRINE) Adult infusion Stopped (09/23/14 1437)     PRN Medications:  sodium chloride, Gerhardt's butt cream, metoprolol, midazolam, morphine injection, ondansetron (ZOFRAN) IV, oxyCODONE, promethazine, sodium chloride, traMADol   Assessment/Plan   1. 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 H/o AVR (bioprosthesis) and MV ring 2012 3. 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 AKI 5. PAF s/p MAZE 2010 6. Acute respiratory failure  Continue milrinone 0.375 and increase revatio to 60 TID per Dr. Gala Romney.  Length of Stay: 5   Graciella Freer PA-C 09/25/2014, 8:22 AM  Advanced Heart Failure Team Pager [phone] (M-F; 7a - 4p)  Please contact [facility] for night-coverage after hours (4p -7a ) and weekends on [email or web address]  Patient seen and examined with Otilio Saber, PA-C. We discussed all aspects of the encounter. I agree with the assessment and plan as stated above.   Ernestine Conrad numbers done personally at bedside. She is extubated and improving but PA pressures significantly increased with weaning of NO without much change in PCWP. Will increase sildenafil to 60 tid. Continue milrinone. Agree with continued gentle diuresis.   The patient is critically ill with multiple organ  systems failure and requires high complexity decision making for assessment and support, frequent evaluation and titration of therapies, application of advanced monitoring technologies and extensive interpretation of multiple databases.   Critical Care Time devoted to patient care services described in this note is 35 Minutes.   Bensimhon, Daniel,MD 8:54 AM

## 2014-09-25 NOTE — Progress Notes (Signed)
2 Days Post-Op Procedure(s) (LRB): REDO STERNOTOMY (N/A) REPAIR OF ASCENDING AORTIC PSEUDOANEURYSM (N/A) TRANSESOPHAGEAL ECHOCARDIOGRAM (TEE) (N/A) Subjective:  doing well extubated, CXR clear, neuro intact PA pressures remain high, CVP 15-20 Rapid afib- amio plus digoxin for rate control No coumadin or lovenox due to risk of bleeding from aortic repair Will increase lasix and mobilize OOB to chair Operative cultures remain negative- cont IV antibiotics  Objective: Vital signs in last 24 hours: Temp:  [97.7 F (36.5 C)-98.8 F (37.1 C)] 97.9 F (36.6 C) (09/15 0800) Pulse Rate:  [25-147] 117 (09/15 0800) Cardiac Rhythm:  [-] Atrial fibrillation (09/15 0800) Resp:  [12-24] 16 (09/15 0800) BP: (79-129)/(47-79) 109/74 mmHg (09/15 0800) SpO2:  [95 %-99 %] 99 % (09/15 0800) Arterial Line BP: (81-141)/(47-83) 129/73 mmHg (09/15 0800) FiO2 (%):  [40 %] 40 % (09/14 1719) Weight:  [194 lb 7.1 oz (88.2 kg)] 194 lb 7.1 oz (88.2 kg) (09/15 0600)  Hemodynamic parameters for last 24 hours: PAP: (50-82)/(22-38) 66/34 mmHg CVP:  [11 mmHg-32 mmHg] 20 mmHg CO:  [3.2 L/min-4.4 L/min] 3.2 L/min CI:  [1.7 L/min/m2-2.4 L/min/m2] 1.7 L/min/m2  Intake/Output from previous day: 09/14 0701 - 09/15 0700 In: 2622.3 [I.V.:2092.3; NG/GT:180; IV Piggyback:350] Out: 1915 [Urine:1395; Emesis/NG output:350; Chest Tube:170] Intake/Output this shift: Total I/O In: 340.6 [I.V.:90.6; IV Piggyback:250] Out: 60 [Urine:60]  extrem warm Lungs clear  Lab Results:  Recent Labs  09/24/14 0415 09/24/14 1626 09/25/14 0423  WBC 10.4  --  9.7  HGB 9.2* 8.5* 8.7*  HCT 27.4* 25.0* 27.0*  PLT 121*  --  124*   BMET:  Recent Labs  09/24/14 0415  09/24/14 1626 09/25/14 0423  NA 135  --  137 134*  K 4.2  --  4.0 3.5  CL 103  --  104 103  CO2 21*  --   --  20*  GLUCOSE 129*  --  138* 118*  BUN 32*  --  28* 25*  CREATININE 1.66*  < > 1.50* 1.49*  CALCIUM 7.0*  --   --  7.4*  < > = values in this  interval not displayed.  PT/INR: No results for input(s): LABPROT, INR in the last 72 hours. ABG    Component Value Date/Time   PHART 7.368 09/24/2014 1853   HCO3 20.9 09/24/2014 1853   TCO2 22 09/24/2014 1853   ACIDBASEDEF 4.0* 09/24/2014 1853   O2SAT 57.8 09/25/2014 0405   CBG (last 3)   Recent Labs  09/24/14 2039 09/24/14 2356 09/25/14 0432  GLUCAP 133* 120* 108*    Assessment/Plan: S/P Procedure(s) (LRB): REDO STERNOTOMY (N/A) REPAIR OF ASCENDING AORTIC PSEUDOANEURYSM (N/A) TRANSESOPHAGEAL ECHOCARDIOGRAM (TEE) (N/A) Mobilize Diuresis d/c tubes/lines See progression orders   LOS: 5 days    Ashley [name] 09/25/2014

## 2014-09-26 ENCOUNTER — Inpatient Hospital Stay (HOSPITAL_COMMUNITY): Payer: Medicare Other

## 2014-09-26 DIAGNOSIS — I480 Paroxysmal atrial fibrillation: Secondary | ICD-10-CM

## 2014-09-26 LAB — CBC
HCT: 27.4 % — ABNORMAL LOW (ref 36.0–46.0)
Hemoglobin: 8.8 g/dL — ABNORMAL LOW (ref 12.0–15.0)
MCH: 29.8 pg (ref 26.0–34.0)
MCHC: 32.1 g/dL (ref 30.0–36.0)
MCV: 92.9 fL (ref 78.0–100.0)
Platelets: 137 10*3/uL — ABNORMAL LOW (ref 150–400)
RBC: 2.95 MIL/uL — ABNORMAL LOW (ref 3.87–5.11)
RDW: 15.9 % — ABNORMAL HIGH (ref 11.5–15.5)
WBC: 9 10*3/uL (ref 4.0–10.5)

## 2014-09-26 LAB — GLUCOSE, CAPILLARY
GLUCOSE-CAPILLARY: 124 mg/dL — AB (ref 65–99)
GLUCOSE-CAPILLARY: 107 mg/dL — AB (ref 65–99)
Glucose-Capillary: 100 mg/dL — ABNORMAL HIGH (ref 65–99)
Glucose-Capillary: 114 mg/dL — ABNORMAL HIGH (ref 65–99)
Glucose-Capillary: 117 mg/dL — ABNORMAL HIGH (ref 65–99)
Glucose-Capillary: 98 mg/dL (ref 65–99)

## 2014-09-26 LAB — COMPREHENSIVE METABOLIC PANEL
ALT: 58 U/L — ABNORMAL HIGH (ref 14–54)
AST: 61 U/L — ABNORMAL HIGH (ref 15–41)
Albumin: 2.9 g/dL — ABNORMAL LOW (ref 3.5–5.0)
Alkaline Phosphatase: 106 U/L (ref 38–126)
Anion gap: 9 (ref 5–15)
BUN: 20 mg/dL (ref 6–20)
CO2: 23 mmol/L (ref 22–32)
Calcium: 7.8 mg/dL — ABNORMAL LOW (ref 8.9–10.3)
Chloride: 103 mmol/L (ref 101–111)
Creatinine, Ser: 1.2 mg/dL — ABNORMAL HIGH (ref 0.44–1.00)
GFR calc Af Amer: 50 mL/min — ABNORMAL LOW (ref >60)
GFR calc non Af Amer: 43 mL/min — ABNORMAL LOW (ref >60)
Glucose, Bld: 99 mg/dL (ref 65–99)
Potassium: 4.1 mmol/L (ref 3.5–5.1)
Sodium: 135 mmol/L (ref 135–145)
Total Bilirubin: 1.4 mg/dL — ABNORMAL HIGH (ref 0.3–1.2)
Total Protein: 6 g/dL — ABNORMAL LOW (ref 6.5–8.1)

## 2014-09-26 LAB — CARBOXYHEMOGLOBIN
Carboxyhemoglobin: 1.4 % (ref 0.5–1.5)
Methemoglobin: 0.7 % (ref 0.0–1.5)
O2 Saturation: 76.6 %
Total hemoglobin: 9.5 g/dL — ABNORMAL LOW (ref 12.0–16.0)

## 2014-09-26 MED ORDER — METOLAZONE 5 MG PO TABS
5.0000 mg | ORAL_TABLET | Freq: Every day | ORAL | Status: AC
  Administered 2014-09-26 – 2014-09-28 (×3): 5 mg via ORAL
  Filled 2014-09-26 (×3): qty 1

## 2014-09-26 MED ORDER — FUROSEMIDE 10 MG/ML IJ SOLN
15.0000 mg/h | INTRAVENOUS | Status: DC
  Administered 2014-09-26 – 2014-09-30 (×5): 15 mg/h via INTRAVENOUS
  Filled 2014-09-26 (×15): qty 25

## 2014-09-26 MED ORDER — AMIODARONE HCL IN DEXTROSE 360-4.14 MG/200ML-% IV SOLN
30.0000 mg/h | INTRAVENOUS | Status: DC
Start: 2014-09-26 — End: 2014-09-29
  Administered 2014-09-26 – 2014-09-28 (×6): 30 mg/h via INTRAVENOUS
  Filled 2014-09-26 (×13): qty 200

## 2014-09-26 MED ORDER — MIDAZOLAM HCL 2 MG/2ML IJ SOLN: 1.0000 mg | INTRAMUSCULAR | Status: DC | PRN

## 2014-09-26 MED ORDER — MORPHINE SULFATE (PF) 2 MG/ML IV SOLN
2.0000 mg | INTRAVENOUS | Status: DC | PRN
  Administered 2014-09-26 – 2014-09-28 (×2): 2 mg via INTRAVENOUS
  Filled 2014-09-26 (×3): qty 1

## 2014-09-26 NOTE — Evaluation (Signed)
Physical Therapy Evaluation Patient Details Name: DEMARA [name] MRN: [id number] DOB: Aug 18, 1940 Today's Date: 09/26/2014   History of Present Illness  Adm with chest pain 9/10. found to have 7 cm pseudoaneurysm of ascending aorta; 9/13 re-do sternotomy for aorta repair; extubated 9/14 PMHx- bil shoulder surgery, C5-6 fusion, AVR and MVR 2012    Clinical Impression  Patient is s/p above surgery resulting in functional limitations due to the deficits listed below (see PT Problem List). Pt groggy yet anxious re: mobility and limited to bed to chair only (+2 moderate assist). Patient will benefit from skilled PT to increase their independence and safety with mobility to allow discharge to the venue listed below.       Follow Up Recommendations Home health PT;Supervision for mobility/OOB (per Case Manager note children plan to provide 24 hr care on d/c)    Equipment Recommendations  None recommended by PT    Recommendations for Other Services OT consult     Precautions / Restrictions Precautions Precautions: Sternal;Fall Restrictions Weight Bearing Restrictions: No (sternal precautions)      Mobility  Bed Mobility Overal bed mobility: Needs Assistance;+2 for physical assistance;+ 2 for safety/equipment Bed Mobility: Supine to Sit     Supine to sit: Max assist;+2 for physical assistance;+2 for safety/equipment;HOB elevated     General bed mobility comments: RN assisted (moving quickly with little time for pt to process and attempt to assist)  Transfers Overall transfer level: Needs assistance Equipment used: 2 person hand held assist Transfers: Sit to/from [employer] Sit to Stand: Mod assist;+2 physical assistance;From elevated surface Stand pivot transfers: Mod assist;+2 physical assistance;+2 safety/equipment       General transfer comment: pt anxious with max encouragement; stood x 2; pivotal steps complicated by RN moving faster than pt could process  and keep up (pulling pt's upper body toward chair when feet 2 steps behind)  Ambulation/Gait             General Gait Details: unable   Stairs            Wheelchair Mobility    Modified Rankin (Stroke Patients Only)       Balance Overall balance assessment: Needs assistance Sitting-balance support: No upper extremity supported;Feet supported Sitting balance-Leahy Scale: Poor Sitting balance - Comments: close supervision as preparing lines/enviroment   Standing balance support: No upper extremity supported Standing balance-Leahy Scale: Poor Standing balance comment: posterior bias                             Pertinent Vitals/Pain HR 112-128 SaO2 >92% on 2L + NO  Pain Assessment: Faces Faces Pain Scale: Hurts little more Pain Location: chest Pain Descriptors / Indicators: Discomfort Pain Intervention(s): Limited activity within patient's tolerance;Monitored during session;Repositioned    Home Living Family/patient expects to be discharged to:: Private residence Living Arrangements: Alone Available Help at Discharge: Family;Available 24 hours/day (children (per Case Mgr note-pt unsure)) Type of Home: Apartment Home Access: Level entry     Home Layout: One level Home Equipment: Walker - 2 wheels;Tub bench      Prior Function Level of Independence: Independent               Hand Dominance        Extremity/Trunk Assessment   Upper Extremity Assessment: Generalized weakness (edematous; groggy, moving slowly)           Lower Extremity Assessment: Generalized weakness;Difficult to assess due to impaired  cognition (groggy, moving slowly)      Cervical / Trunk Assessment: Other exceptions  Communication   Communication: No difficulties  Cognition Arousal/Alertness: Lethargic;Suspect due to medications Behavior During Therapy: Flat affect Overall Cognitive Status: Impaired/Different from baseline Area of Impairment:  Attention;Memory;Following commands;Safety/judgement;Awareness;Problem solving   Current Attention Level: Sustained Memory: Decreased recall of precautions;Decreased short-term memory Following Commands: Follows one step commands with increased time Safety/Judgement: Decreased awareness of safety;Decreased awareness of deficits Awareness: Intellectual Problem Solving: Slow processing;Difficulty sequencing;Requires verbal cues;Requires tactile cues General Comments: very groggy with frequent eye-closing; unable to recall sternal precautions; unsure of d/c plans related to childern assisting her on d/c    General Comments      Exercises General Exercises - Lower Extremity Ankle Circles/Pumps: AROM;Both;10 reps      Assessment/Plan    PT Assessment Patient needs continued PT services  PT Diagnosis Difficulty walking;Generalized weakness   PT Problem List Decreased strength;Decreased activity tolerance;Decreased balance;Decreased mobility;Decreased cognition;Decreased knowledge of use of DME;Decreased safety awareness;Decreased knowledge of precautions;Obesity  PT Treatment Interventions DME instruction;Gait training;Functional mobility training;Therapeutic activities;Therapeutic exercise;Balance training;Cognitive remediation;Patient/family education   PT Goals (Current goals can be found in the Care Plan section) Acute Rehab PT Goals Patient Stated Goal: be able to live alone again PT Goal Formulation: With patient Time For Goal Achievement: 10/03/14 Potential to Achieve Goals: Good    Frequency Min 3X/week   Barriers to discharge        Co-evaluation               End of Session Equipment Utilized During Treatment: Gait belt;Oxygen (nitric oxide) Activity Tolerance: Patient limited by lethargy;Other (comment) (and anxiety) Patient left: in chair;with call bell/phone within reach;with nursing/sitter in room Nurse Communication: Mobility status         Time:  1610-9604 PT Time Calculation (min) (ACUTE ONLY): 31 min   Charges:   PT Evaluation $Initial PT Evaluation Tier I: 1 Procedure PT Treatments $Therapeutic Activity: 8-22 mins   PT G Codes:        Ashley [name] Oct 04, 2014, 3:36 PM Pager [phone]

## 2014-09-26 NOTE — Progress Notes (Signed)
Patient ID: Ashley [name], female   DOB: Mar 13, 1940, 74 y.o.   MRN: [id number] EVENING ROUNDS NOTE :     [address]       [place] [address]             [phone]                 3 Days Post-Op Procedure(s) (LRB): REDO STERNOTOMY (N/A) REPAIR OF ASCENDING AORTIC PSEUDOANEURYSM (N/A) TRANSESOPHAGEAL ECHOCARDIOGRAM (TEE) (N/A)  Total Length of Stay:  LOS: 6 days  BP 141/84 mmHg  Pulse 122  Temp(Src) 97.7 F (36.5 C) (Oral)  Resp 6  Ht  (1.6 m)  Wt 199 lb 1.2 oz (90.3 kg)  BMI 35.27 kg/m2  SpO2 98%  .Intake/Output      09/16 0701 - 09/17 0700   P.O. 300   I.V. (mL/kg) 556 (6.2)   IV Piggyback 200   Total Intake(mL/kg) 1056 (11.7)   Urine (mL/kg/hr) 2285 (2.1)   Chest Tube 90 (0.1)   Total Output 2375   Net -1319         . sodium chloride 20 mL/hr at 09/25/14 1000  . sodium chloride    . sodium chloride 20 mL/hr at 09/26/14 1900  . amiodarone 30 mg/hr (09/26/14 1900)  . furosemide (LASIX) infusion 15 mg/hr (09/26/14 1900)  . lactated ringers Stopped (09/23/14 2300)  . milrinone 0.375 mcg/kg/min (09/26/14 1900)  . norepinephrine (LEVOPHED) Adult infusion Stopped (09/25/14 0615)     Lab Results  Component Value Date   WBC 9.0 09/26/2014   HGB 8.8* 09/26/2014   HCT 27.4* 09/26/2014   PLT 137* 09/26/2014   GLUCOSE 99 09/26/2014   CHOL 169 10/11/2010   TRIG 100 10/11/2010   HDL 47 10/11/2010   LDLCALC 102* 10/11/2010   ALT 58* 09/26/2014   AST 61* 09/26/2014   NA 135 09/26/2014   K 4.1 09/26/2014   CL 103 09/26/2014   CREATININE 1.20* 09/26/2014   BUN 20 09/26/2014   CO2 23 09/26/2014   TSH 15.189* 10/11/2010   INR 1.28 09/20/2014   HGBA1C 6.8* 09/22/2014   Still in afib Extubated On NO via nasal canula On lasix drip  [name] MD  Beeper [phone] Office [phone] 09/26/2014 7:11 PM

## 2014-09-26 NOTE — Progress Notes (Signed)
ANTIBIOTIC CONSULT NOTE  Pharmacy Consult for vancomycin Indication: empiric   Allergies  Allergen Reactions  . Codeine Nausea And Vomiting  . Darvocet [Propoxyphene N-Acetaminophen] Nausea And Vomiting  . Iodine Other (See Comments)    Pt does not recall this reaction  . Lisinopril Other (See Comments)    Possible cough    Patient Measurements: Height: 5\' 3"  (160 cm) Weight: 199 lb 1.2 oz (90.3 kg) IBW/kg (Calculated) : 52.4 Adjusted Body Weight:   Vital Signs: Temp: 97 F (36.1 C) (09/16 1200) Temp Source: Axillary (09/16 1200) BP: 133/74 mmHg (09/16 1200) Pulse Rate: 116 (09/16 1221) Intake/Output from previous day: 09/15 0701 - 09/16 0700 In: 1584.2 [P.O.:220; I.V.:864.2; IV Piggyback:500] Out: 1505 [Urine:1415; Chest Tube:90] Intake/Output from this shift: Total I/O In: 320 [P.O.:120; IV Piggyback:200] Out: 725 [Chest Tube:725]  Labs:  Recent Labs  09/24/14 0415  09/25/14 0423 09/25/14 1521 09/26/14 0520  WBC 10.4  --  9.7  --  9.0  HGB 9.2*  < > 8.7* 9.2* 8.8*  PLT 121*  --  124*  --  137*  CREATININE 1.66*  < > 1.49* 1.20* 1.20*  < > = values in this interval not displayed. Estimated Creatinine Clearance: 43.9 mL/min (by C-G formula based on Cr of 1.2). No results for input(s): VANCOTROUGH, VANCOPEAK, VANCORANDOM, GENTTROUGH, GENTPEAK, GENTRANDOM, TOBRATROUGH, TOBRAPEAK, TOBRARND, AMIKACINPEAK, AMIKACINTROU, AMIKACIN in the last 72 hours.   Microbiology: Recent Results (from the past 720 hour(s))  MRSA PCR Screening     Status: None   Collection Time: 09/20/14  3:54 PM  Result Value Ref Range Status   MRSA by PCR NEGATIVE NEGATIVE Final    Comment:        The GeneXpert MRSA Assay (FDA approved for NASAL specimens only), is one component of a comprehensive MRSA colonization surveillance program. It is not intended to diagnose MRSA infection nor to guide or monitor treatment for MRSA infections.   Surgical pcr screen     Status: None   Collection Time: 09/22/14  2:09 AM  Result Value Ref Range Status   MRSA, PCR NEGATIVE NEGATIVE Final   Staphylococcus aureus NEGATIVE NEGATIVE Final    Comment:        The Xpert SA Assay (FDA approved for NASAL specimens in patients over 27 years of age), is one component of a comprehensive surveillance program.  Test performance has been validated by [facility] for patients greater than or equal to 77 year old. It is not intended to diagnose infection nor to guide or monitor treatment.   Anaerobic culture     Status: None (Preliminary result)   Collection Time: 09/23/14 10:01 AM  Result Value Ref Range Status   Specimen Description WOUND CHEST  Final   Special Requests   Final    CHEST SPECIMEN A STERNUM PT ON ZINACEF AND VANCOMYCIN   Gram Stain   Final    NO WBC SEEN NO SQUAMOUS EPITHELIAL CELLS SEEN NO ORGANISMS SEEN Performed at [facility]    Culture   Final    NO ANAEROBES ISOLATED; CULTURE IN PROGRESS FOR 5 DAYS Performed at [facility]    Report Status PENDING  Incomplete  Anaerobic culture     Status: None (Preliminary result)   Collection Time: 09/23/14 10:01 AM  Result Value Ref Range Status   Specimen Description WOUND CHEST  Final   Special Requests   Final    CHEST SPECIMEN C MIDIASTINAL PT ON ZINACEF AND VANCOMYCIN   Gram  Stain   Final    FEW WBC PRESENT,BOTH PMN AND MONONUCLEAR NO ORGANISMS SEEN Performed at [facility]    Culture   Final    NO ANAEROBES ISOLATED; CULTURE IN PROGRESS FOR 5 DAYS Performed at [facility]    Report Status PENDING  Incomplete  Fungus Culture with Smear     Status: None (Preliminary result)   Collection Time: 09/23/14 10:01 AM  Result Value Ref Range Status   Specimen Description WOUND CHEST  Final   Special Requests STERNUM SPECIMEN A PT ON ZINACEF,VANCOMYCIN  Final   Fungal Smear   Final    NO YEAST OR FUNGAL ELEMENTS SEEN Performed at [facility]    Culture   Final     CULTURE IN PROGRESS FOR FOUR WEEKS Performed at [facility]    Report Status PENDING  Incomplete  Fungus Culture with Smear     Status: None (Preliminary result)   Collection Time: 09/23/14 10:01 AM  Result Value Ref Range Status   Specimen Description WOUND CHEST  Final   Special Requests   Final    CHEST SPECIMEN C MEDIASTINAL PT ON ZINACEF AND VANCOMYCIN   Fungal Smear   Final    NO YEAST OR FUNGAL ELEMENTS SEEN Performed at [facility]    Culture   Final    CULTURE IN PROGRESS FOR FOUR WEEKS Performed at [facility]    Report Status PENDING  Incomplete  Wound culture     Status: None   Collection Time: 09/23/14 10:01 AM  Result Value Ref Range Status   Specimen Description WOUND CHEST  Final   Special Requests CHEST A STERNUM,ZINACEF,VANCOMYCIN  Final   Gram Stain   Final    RARE WBC PRESENT, PREDOMINANTLY PMN NO SQUAMOUS EPITHELIAL CELLS SEEN NO ORGANISMS SEEN Performed at [facility]    Culture   Final    NO GROWTH 2 DAYS Performed at [facility]    Report Status 09/25/2014 FINAL  Final  Wound culture     Status: None   Collection Time: 09/23/14 10:01 AM  Result Value Ref Range Status   Specimen Description WOUND CHEST  Final   Special Requests   Final    CHEST SPECIMEN C MEDIASTINAL PT ON ZINACEF AND VANCOMYCIN   Gram Stain   Final    NO WBC SEEN NO SQUAMOUS EPITHELIAL CELLS SEEN NO ORGANISMS SEEN Performed at [facility]    Culture   Final    NO GROWTH 2 DAYS Performed at [facility]    Report Status 09/25/2014 FINAL  Final  AFB culture with smear     Status: None (Preliminary result)   Collection Time: 09/23/14 10:01 AM  Result Value Ref Range Status   Specimen Description WOUND CHEST  Final   Special Requests SPECIMEN A STERNUM PT ON  ZINACEF AND VANCOMYCIN  Final   Acid Fast Smear   Final    NO ACID FAST BACILLI SEEN Performed at [facility]    Culture   Final     CULTURE WILL BE EXAMINED FOR 6 WEEKS BEFORE ISSUING A FINAL REPORT Performed at [facility]    Report Status PENDING  Incomplete  AFB culture with smear     Status: None (Preliminary result)   Collection Time: 09/23/14 10:01 AM  Result Value Ref Range Status   Specimen Description WOUND CHEST  Final   Special Requests   Final  CHEST SPECIMEN C MEDIASTINAL PT ON ZINACEF AND VANCOMYCIN   Acid Fast Smear   Final    NO ACID FAST BACILLI SEEN Performed at [facility]    Culture   Final    CULTURE WILL BE EXAMINED FOR 6 WEEKS BEFORE ISSUING A FINAL REPORT Performed at [facility]    Report Status PENDING  Incomplete  Gram stain     Status: None   Collection Time: 09/23/14 10:57 AM  Result Value Ref Range Status   Specimen Description PERICARDIAL  Final   Special Requests NONE  Final   Gram Stain   Final    RARE WBC PRESENT,BOTH PMN AND MONONUCLEAR NO ORGANISMS SEEN Gram Stain Report Called to,Read Back By and Verified With: Laurann Montana AT 1129 09/23/14 BY L BENFIELD    Report Status 09/23/2014 FINAL  Final  Tissue culture     Status: None (Preliminary result)   Collection Time: 09/23/14 10:58 AM  Result Value Ref Range Status   Specimen Description TISSUE PERICARDIAL  Final   Special Requests NONE  Final   Gram Stain   Final    FEW WBC PRESENT,BOTH PMN AND MONONUCLEAR NO ORGANISMS SEEN Performed at [facility]    Culture   Final    NO GROWTH 2 DAYS Note: Gram Stain Report Called to,Read Back By and Verified With: Viviano Simas 09/23/14 @ 0706P YIMSU Performed at [facility]    Report Status PENDING  Incomplete    Medical History: Past Medical History  Diagnosis Date  . HTN (hypertension)   . Dyslipidemia   . GERD (gastroesophageal reflux disease)   . Mitral regurgitation     on recent TEE 07/07/2010  . DJD (degenerative joint disease), cervical     s/p epidural steroid injections  . Hiatal hernia   . Osteopenia      frax neg 11/10, repeat in 2-4 years DJD C spine Dr. Ethelene Hal s/p epidural steroid injections   . Paroxysmal atrial fibrillation     on coumadin  . Diastolic dysfunction   . Rheumatic aortic stenosis     I will get an echo today to evaluate her murmur  . Heart murmur   . Diverticulitis     Medications:  Anti-infectives    Start     Dose/Rate Route Frequency Ordered Stop   09/24/14 0830  vancomycin (VANCOCIN) IVPB 1000 mg/200 mL premix     1,000 mg 200 mL/hr over 60 Minutes Intravenous Every 24 hours 09/23/14 1351 09/27/14 2359   09/24/14 0000  cefUROXime (ZINACEF) 1.5 g in dextrose 5 % 50 mL IVPB     1.5 g 100 mL/hr over 30 Minutes Intravenous Every 12 hours 09/23/14 1337 09/25/14 1214   09/23/14 2030  vancomycin (VANCOCIN) IVPB 1000 mg/200 mL premix  Status:  Discontinued     1,000 mg 200 mL/hr over 60 Minutes Intravenous  Once 09/23/14 1338 09/23/14 1345   09/23/14 1111  vancomycin (VANCOCIN) powder  Status:  Discontinued       As needed 09/23/14 1111 09/23/14 1335   09/23/14 1110  vancomycin (VANCOCIN) 1,000 mg in sodium chloride 0.9 % 1,000 mL irrigation  Status:  Discontinued       As needed 09/23/14 1110 09/23/14 1335   09/23/14 1045  vancomycin (VANCOCIN) 1,000 mg in sodium chloride 0.9 % 1,000 mL irrigation  Status:  Discontinued      Irrigation To Surgery 09/23/14 1041 09/23/14 1337   09/23/14 0400  vancomycin (VANCOCIN) 1,250 mg  in sodium chloride 0.9 % 250 mL IVPB     1,250 mg 166.7 mL/hr over 90 Minutes Intravenous To Surgery 09/22/14 1518 09/23/14 0930   09/23/14 0400  cefUROXime (ZINACEF) 1.5 g in dextrose 5 % 50 mL IVPB     1.5 g 100 mL/hr over 30 Minutes Intravenous To Surgery 09/22/14 1518 09/23/14 1229   09/23/14 0400  cefUROXime (ZINACEF) 750 mg in dextrose 5 % 50 mL IVPB  Status:  Discontinued     750 mg 100 mL/hr over 30 Minutes Intravenous To Surgery 09/22/14 1518 09/23/14 1337     Assessment: 74 yof s/p redo sternotomy and ascending aorta replacement.  ID:  Vanc D#4/5 for ?aortic tissue infxn - afeb, WBC WNL, Scr, 1.2,   Vanc 9/13>>(9/17) Zinacef 9/13>>9/15  9/13 Chest wound - NGTD 9/13 Tissue - NGTD 9/12 MRSA - NEG  Goal of Therapy:  Vancomycin trough level 15-20 mcg/ml  Plan:  - Vancomycin 1gm IV Q24H   Sheppard Coil PharmD., BCPS Clinical Pharmacist Pager [phone] 09/26/2014 2:20 PM

## 2014-09-26 NOTE — Progress Notes (Signed)
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 Heart rate controlled with oral amiodarone and digoxin  Chest x-ray clear, co-OX remains satisfactory on milrinone  She remains fluid overloaded with normal BUN creatinine and suboptimal response to Lasix-continue 80 mg IV twice a day  We will remove one of her mediastinal drains today and mobilize out of bed and physical therapy has been ordered as well. Start oral diet-full liquids  Patient  to remain on milrinone per advanced heart failure cardiology team. Sildenafil being titrated.  Operative cultures remain negative. Continue IV vancomycin until chest drain out and cultures are negative for at least 72 hours.  Objective: Vital signs in last 24 hours: Temp:  [97.5 F (36.4 C)-98.3 F (36.8 C)] 97.5 F (36.4 C) (09/16 0742) Pulse Rate:  [92-145] 114 (09/16 0600) Cardiac Rhythm:  [-] Atrial fibrillation (09/15 2000) Resp:  [0-23] 15 (09/16 0600) BP: (103-155)/(67-93) 121/81 mmHg (09/16 0600) SpO2:  [95 %-99 %] 97 % (09/16 0600) Arterial Line BP: (123-181)/(68-106) 123/68 mmHg (09/16 0600) Weight:  [199 lb 1.2 oz (90.3 kg)] 199 lb 1.2 oz (90.3 kg) (09/16 0500)  Hemodynamic parameters for last 24 hours: PAP: (75)/(41) 75/41 mmHg CVP:  [16 mmHg-42 mmHg] 19 mmHg CO:  [3.2 L/min] 3.2 L/min CI:  [2.1 L/min/m2] 2.1 L/min/m2  Intake/Output from previous day: 09/15 0701 - 09/16 0700 In: 1584.2 [P.O.:220; I.V.:864.2; IV Piggyback:500] Out: 1505 [Urine:1415; Chest Tube:90] Intake/Output this shift:    Alert and responsive She appears somewhat more sore today Extremities warm Breath sounds clear Minimal  chest tube drainage Abdomen nondistended, soft  Lab Results:  Recent Labs  09/25/14 0423 09/25/14 1521 09/26/14 0520  WBC 9.7  --  9.0  HGB 8.7* 9.2* 8.8*  HCT 27.0* 27.0* 27.4*  PLT 124*  --  137*   BMET:  Recent Labs  09/25/14 0423 09/25/14 1521 09/26/14 0520  NA 134* 130* 135  K 3.5 3.7 4.1  CL 103 113* 103  CO2 20*  --  23  GLUCOSE 118* 139* 99  BUN 25* 22* 20  CREATININE 1.49* 1.20* 1.20*  CALCIUM 7.4*  --  7.8*    PT/INR: No results for input(s): LABPROT, INR in the last 72 hours. ABG    Component Value Date/Time   PHART 7.368 09/24/2014 1853   HCO3 20.9 09/24/2014 1853   TCO2 21 09/25/2014 1521   ACIDBASEDEF 4.0* 09/24/2014 1853   O2SAT 76.6 09/26/2014 0440   CBG (last 3)   Recent Labs  09/25/14 1928 09/25/14 2347 09/26/14 0340  GLUCAP 101* 107* 100*    Assessment/Plan: S/P Procedure(s) (LRB): REDO STERNOTOMY (N/A) REPAIR OF ASCENDING AORTIC PSEUDOANEURYSM (N/A) TRANSESOPHAGEAL ECHOCARDIOGRAM (TEE) (N/A) Mobilize Diuresis d/c tubes/lines See progression orders Continue Foley catheter for diuresis   LOS: 6 days    [name] 09/26/2014

## 2014-09-26 NOTE — Progress Notes (Addendum)
DC'd largest MS CT at this time.  Pt tolerated well.  No s/s of any acute distress

## 2014-09-26 NOTE — Progress Notes (Addendum)
Advanced Heart Failure Rounding Note  Primary Physician: Dr Karsten Fells Primary Cardiologist: Dr Armanda Magic previously. Primary HF: Ashley [name]  Subjective:    Successfully extubated 09/24/14 after weaning of NO. Swan d/c 09/25/14  Feels tired and sore today.  Breathing OK, just coughing a lot.   I/O relatively even (+30mL) on 80 mg IV lasix BID and 0.375 mcg/min of milrinone. Co-ox 76.6%  CVP 22. Weight up 27 pounds   Objective:   Weight Range: 199 lb 1.2 oz (90.3 kg) Body mass index is 35.27 kg/(m^2).   Vital Signs:   Temp:  [97.5 F (36.4 C)-98.3 F (36.8 C)] 97.5 F (36.4 C) (09/16 0742) Pulse Rate:  [92-145] 114 (09/16 0600) Resp:  [0-23] 15 (09/16 0600) BP: (103-155)/(67-93) 121/81 mmHg (09/16 0600) SpO2:  [95 %-99 %] 97 % (09/16 0600) Arterial Line BP: (123-181)/(68-106) 123/68 mmHg (09/16 0600) Weight:  [199 lb 1.2 oz (90.3 kg)] 199 lb 1.2 oz (90.3 kg) (09/16 0500) Last BM Date: 09/22/14  Weight change: Filed Weights   09/24/14 0600 09/25/14 0600 09/26/14 0500  Weight: 197 lb 12 oz (89.7 kg) 194 lb 7.1 oz (88.2 kg) 199 lb 1.2 oz (90.3 kg)    Intake/Output:   Intake/Output Summary (Last 24 hours) at 09/26/14 0805 Last data filed at 09/26/14 0600  Gross per 24 hour  Intake 1243.6 ml  Output   1445 ml  Net -201.4 ml     Physical Exam: General: NAD HEENT: Normal Neck: supple. RIJ swan. JVP elevated. Carotids 2+ bilat; no bruits. No lymphadenopathy or thryomegaly noted. R Cordis in place. Cor: PMI nondisplaced. Tachy. Irregularly irregular. 2/6 TR Lungs: CTA anteriorly Abdomen: soft, NT, ND. No HSM. No bruits or masses. +BS Extremities: no cyanosis, clubbing, rash. 1+ edema. SCDs in place. GU: Foley Neuro: Moves all 4 extremities without difficulty. No focal deficits. Affect flat but appropriate   Telemetry: Afib RVR 120-130s (goal <130), as high as 140-150 seldomly.  Labs: CBC  Recent Labs  09/25/14 0423 09/25/14 1521 09/26/14 0520   WBC 9.7  --  9.0  HGB 8.7* 9.2* 8.8*  HCT 27.0* 27.0* 27.4*  MCV 90.9  --  92.9  PLT 124*  --  137*   Basic Metabolic Panel  Recent Labs  09/24/14 0415 09/24/14 1625  09/25/14 0423 09/25/14 1521 09/26/14 0520  NA 135  --   < > 134* 130* 135  K 4.2  --   < > 3.5 3.7 4.1  CL 103  --   < > 103 113* 103  CO2 21*  --   --  20*  --  23  GLUCOSE 129*  --   < > 118* 139* 99  BUN 32*  --   < > 25* 22* 20  CALCIUM 7.0*  --   --  7.4*  --  7.8*  MG 2.4 2.2  --   --   --   --   < > = values in this interval not displayed. Liver Function Tests  Recent Labs  09/25/14 0423 09/26/14 0520  AST 46* 61*  ALT 52 58*  ALKPHOS 93 106  BILITOT 0.8 1.4*  PROT 5.2* 6.0*  ALBUMIN 2.7* 2.9*   No results for input(s): LIPASE, AMYLASE in the last 72 hours. Cardiac Enzymes No results for input(s): CKTOTAL, CKMB, CKMBINDEX, TROPONINI in the last 72 hours.  BNP: BNP (last 3 results) No results for input(s): BNP in the last 8760 hours.  ProBNP (last 3 results) No results for input(s):  PROBNP in the last 8760 hours.   D-Dimer No results for input(s): DDIMER in the last 72 hours. Hemoglobin A1C No results for input(s): HGBA1C in the last 72 hours. Fasting Lipid Panel No results for input(s): CHOL, HDL, LDLCALC, TRIG, CHOLHDL, LDLDIRECT in the last 72 hours. Thyroid Function Tests No results for input(s): TSH, T4TOTAL, T3FREE, THYROIDAB in the last 72 hours.  Invalid input(s): FREET3  Other results:     Imaging/Studies:  Dg Chest Port 1 View  09/25/2014   CLINICAL DATA:  Repair of ascending aortic pseudoaneurysm.  EXAM: PORTABLE CHEST - 1 VIEW  COMPARISON:  Radiograph 09/24/2014.  FINDINGS: Interval extubation with no increase in atelectasis. Swan-Ganz catheter with tip in the LEFT main pulmonary artery. A second RIGHT central venous line is noted. Mediastinal drain noted.  There is LEFT basilar atelectasis and platelike atelectasis in the RIGHT upper lobe. No pneumothorax. No  pulmonary edema.  IMPRESSION: 1. Interval extubation without complication. 2. Swan-Ganz catheter with tip in the LEFT main pulmonary artery. 3. No change in atelectasis.  No pneumothorax.   Electronically Signed   By: Genevive Bi M.D.   On: 09/25/2014 08:21    Latest Echo  Latest Cath   Medications:     Scheduled Medications: . acetaminophen  1,000 mg Oral 4 times per day   Or  . acetaminophen (TYLENOL) oral liquid 160 mg/5 mL  1,000 mg Per Tube 4 times per day  . amiodarone  200 mg Oral BID  . antiseptic oral rinse  7 mL Mouth Rinse QID  . aspirin EC  81 mg Oral Daily  . bisacodyl  10 mg Oral Daily   Or  . bisacodyl  10 mg Rectal Daily  . chlorhexidine gluconate  15 mL Mouth Rinse BID  . chlorpheniramine-HYDROcodone  5 mL Oral Q12H  . digoxin  0.125 mg Intravenous Daily  . docusate sodium  200 mg Oral Daily  . furosemide  80 mg Intravenous BID  . Influenza vac split quadrivalent PF  0.5 mL Intramuscular Tomorrow-1000  . insulin aspart  0-24 Units Subcutaneous 6 times per day  . metoCLOPramide (REGLAN) injection  10 mg Intravenous 4 times per day  . metoprolol tartrate  12.5 mg Oral BID   Or  . metoprolol tartrate  12.5 mg Per Tube BID  . pantoprazole  40 mg Oral Daily  . sildenafil  60 mg Oral TID  . sodium chloride  3 mL Intravenous Q12H  . vancomycin  1,000 mg Intravenous Q24H    Infusions: . sodium chloride 20 mL/hr at 09/25/14 1000  . sodium chloride    . sodium chloride 20 mL/hr at 09/25/14 1800  . lactated ringers Stopped (09/23/14 2300)  . lactated ringers Stopped (09/23/14 2000)  . milrinone 0.375 mcg/kg/min (09/26/14 0451)  . nitroGLYCERIN Stopped (09/23/14 1715)  . norepinephrine (LEVOPHED) Adult infusion Stopped (09/25/14 0615)  . phenylephrine (NEO-SYNEPHRINE) Adult infusion Stopped (09/23/14 1437)    PRN Medications: sodium chloride, Gerhardt's butt cream, metoprolol, midazolam, morphine injection, ondansetron (ZOFRAN) IV, oxyCODONE, promethazine,  sodium chloride, traMADol   Assessment/Plan   1. 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 H/o AVR (bioprosthesis) and MV ring 2012 3. 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 AKI 5. PAF s/p MAZE 2010 6. Acute respiratory failure  Goal HR < 130 per Dr. Donata Clay.  Will keep eye on, up into 140-150s this am. On 200 mg Amio BID currently.  Continues to progress. Chest tube and A line  out today per Dr Donata Clay.  Will continue to monitor.  Continue diuresis with 80 mg IV lasix BID.  Continue milrinone 0.375 and revatio to 60 TID.  Length of Stay: 6   Graciella Freer PA-C 09/26/2014, 8:05 AM  Advanced Heart Failure Team Pager [phone] (M-F; 7a - 4p)  Please contact [facility] for night-coverage after hours (4p -7a ) and weekends on [email or web address]  Patient seen and examined with Otilio Saber, PA-C. We discussed all aspects of the encounter. I agree with the assessment and plan as stated above.   She is improving slowly. CVP remains very high. Co-ox good. Weight up 27 pounds. Remains on milrinone and sildenafil for RV failure. AF is fast. Need diuresis. Will add metolazone  daily. If not improving will switch to lasix gtt. Restart IV amio for rate control. Avoiding anticoagulation due to aortic hematoma on admit. Continue SCDs.   The patient is critically ill with multiple organ systems failure and requires high complexity decision making for assessment and support, frequent evaluation and titration of therapies, application of advanced monitoring technologies and extensive interpretation of multiple databases.   Critical Care Time devoted to patient care services described in this note is 35 Minutes.  Ashley [name], Daniel,MD 11:07 AM  Addendum:  Sluggish response to bolus lasix. Weight up 27 pounds. Start lasix gtt and metolazone.   Ashley [name], Daniel,MD 1:47 PM    .

## 2014-09-26 NOTE — Care Management Note (Signed)
Case Management Note  Patient Details  Name: Ashley [name] MRN: [id number] Date of Birth: 08-16-1940  Subjective/Objective:    Daughter in with patient today, [place] was updating daughter on patients plan for discharge.  Daughter states that this is not the plan.  None of the children are able to be off or around to care for Mom and would like her to go to go to Rehab facility.  Daughter states she had already talked with the physician.  PT ordered.  Will place SW consult.                Action/Plan:   Expected Discharge Date:                  Expected Discharge Plan:  Skilled Nursing Facility  In-House Referral:  Clinical Social Work  Discharge planning Services  CM Consult  Post Acute Care Choice:    Choice offered to:     DME Arranged:    DME Agency:     HH Arranged:    HH Agency:     Status of Service:  In process, will continue to follow  Medicare Important Message Given:  Yes-second notification given Date Medicare IM Given:    Medicare IM give by:    Date Additional Medicare IM Given:    Additional Medicare Important Message give by:     If discussed at Long Length of Stay Meetings, dates discussed:    Additional Comments:  Vangie Bicker, RN 09/26/2014, 3:26 PM

## 2014-09-27 ENCOUNTER — Inpatient Hospital Stay (HOSPITAL_COMMUNITY): Payer: Medicare Other

## 2014-09-27 LAB — GLUCOSE, CAPILLARY
GLUCOSE-CAPILLARY: 129 mg/dL — AB (ref 65–99)
GLUCOSE-CAPILLARY: 146 mg/dL — AB (ref 65–99)
GLUCOSE-CAPILLARY: 151 mg/dL — AB (ref 65–99)
GLUCOSE-CAPILLARY: 152 mg/dL — AB (ref 65–99)
GLUCOSE-CAPILLARY: 95 mg/dL (ref 65–99)
Glucose-Capillary: 114 mg/dL — ABNORMAL HIGH (ref 65–99)

## 2014-09-27 LAB — CBC
HCT: 29.7 % — ABNORMAL LOW (ref 36.0–46.0)
Hemoglobin: 9.5 g/dL — ABNORMAL LOW (ref 12.0–15.0)
MCH: 29.6 pg (ref 26.0–34.0)
MCHC: 32 g/dL (ref 30.0–36.0)
MCV: 92.5 fL (ref 78.0–100.0)
Platelets: 170 10*3/uL (ref 150–400)
RBC: 3.21 MIL/uL — ABNORMAL LOW (ref 3.87–5.11)
RDW: 15.7 % — ABNORMAL HIGH (ref 11.5–15.5)
WBC: 9 10*3/uL (ref 4.0–10.5)

## 2014-09-27 LAB — COMPREHENSIVE METABOLIC PANEL
ALT: 53 U/L (ref 14–54)
AST: 51 U/L — ABNORMAL HIGH (ref 15–41)
Albumin: 2.7 g/dL — ABNORMAL LOW (ref 3.5–5.0)
Alkaline Phosphatase: 115 U/L (ref 38–126)
Anion gap: 8 (ref 5–15)
BUN: 16 mg/dL (ref 6–20)
CO2: 29 mmol/L (ref 22–32)
Calcium: 8.1 mg/dL — ABNORMAL LOW (ref 8.9–10.3)
Chloride: 96 mmol/L — ABNORMAL LOW (ref 101–111)
Creatinine, Ser: 1.14 mg/dL — ABNORMAL HIGH (ref 0.44–1.00)
GFR calc Af Amer: 54 mL/min — ABNORMAL LOW (ref >60)
GFR calc non Af Amer: 46 mL/min — ABNORMAL LOW (ref >60)
Glucose, Bld: 122 mg/dL — ABNORMAL HIGH (ref 65–99)
Potassium: 3.3 mmol/L — ABNORMAL LOW (ref 3.5–5.1)
Sodium: 133 mmol/L — ABNORMAL LOW (ref 135–145)
Total Bilirubin: 1.1 mg/dL (ref 0.3–1.2)
Total Protein: 5.6 g/dL — ABNORMAL LOW (ref 6.5–8.1)

## 2014-09-27 LAB — CARBOXYHEMOGLOBIN
Carboxyhemoglobin: 1.6 % — ABNORMAL HIGH (ref 0.5–1.5)
Methemoglobin: 0.8 % (ref 0.0–1.5)
O2 Saturation: 78.8 %
Total hemoglobin: 9.8 g/dL — ABNORMAL LOW (ref 12.0–16.0)

## 2014-09-27 LAB — BASIC METABOLIC PANEL
Anion gap: 10 (ref 5–15)
BUN: 15 mg/dL (ref 6–20)
CHLORIDE: 89 mmol/L — AB (ref 101–111)
CO2: 30 mmol/L (ref 22–32)
CREATININE: 1.19 mg/dL — AB (ref 0.44–1.00)
Calcium: 8.4 mg/dL — ABNORMAL LOW (ref 8.9–10.3)
GFR calc non Af Amer: 44 mL/min — ABNORMAL LOW (ref >60)
GFR, EST AFRICAN AMERICAN: 51 mL/min — AB (ref >60)
Glucose, Bld: 148 mg/dL — ABNORMAL HIGH (ref 65–99)
POTASSIUM: 3.2 mmol/L — AB (ref 3.5–5.1)
SODIUM: 129 mmol/L — AB (ref 135–145)

## 2014-09-27 LAB — TISSUE CULTURE: Culture: NO GROWTH

## 2014-09-27 MED ORDER — POTASSIUM CHLORIDE CRYS ER 20 MEQ PO TBCR
40.0000 meq | EXTENDED_RELEASE_TABLET | ORAL | Status: AC
  Administered 2014-09-27 – 2014-09-28 (×2): 40 meq via ORAL
  Filled 2014-09-27 (×2): qty 2

## 2014-09-27 MED ORDER — CETYLPYRIDINIUM CHLORIDE 0.05 % MT LIQD
7.0000 mL | Freq: Two times a day (BID) | OROMUCOSAL | Status: DC
  Administered 2014-09-27 – 2014-09-28 (×3): 7 mL via OROMUCOSAL

## 2014-09-27 MED ORDER — POTASSIUM CHLORIDE 10 MEQ/50ML IV SOLN
10.0000 meq | INTRAVENOUS | Status: AC
  Administered 2014-09-27 (×4): 10 meq via INTRAVENOUS
  Filled 2014-09-27 (×4): qty 50

## 2014-09-27 MED ORDER — SPIRONOLACTONE 25 MG PO TABS
12.5000 mg | ORAL_TABLET | Freq: Every day | ORAL | Status: DC
  Administered 2014-09-27 – 2014-10-06 (×9): 12.5 mg via ORAL
  Filled 2014-09-27 (×8): qty 1

## 2014-09-27 MED ORDER — METOPROLOL SUCCINATE ER 25 MG PO TB24
25.0000 mg | ORAL_TABLET | Freq: Every day | ORAL | Status: DC
  Administered 2014-09-27: 25 mg via ORAL
  Filled 2014-09-27 (×2): qty 1

## 2014-09-27 MED ORDER — CALCIUM CARBONATE ANTACID 500 MG PO CHEW
200.0000 mg | CHEWABLE_TABLET | Freq: Once | ORAL | Status: AC
  Administered 2014-09-27: 200 mg via ORAL
  Filled 2014-09-27: qty 1

## 2014-09-27 MED ORDER — AMIODARONE IV BOLUS ONLY 150 MG/100ML
150.0000 mg | Freq: Once | INTRAVENOUS | Status: AC
  Administered 2014-09-27: 150 mg via INTRAVENOUS
  Filled 2014-09-27: qty 100

## 2014-09-27 NOTE — Progress Notes (Addendum)
Patient ID: Ashley [name], female   DOB: [birth year], 74 y.o.   MRN: [id number]  Advanced Heart Failure Rounding Note  Primary Physician: Dr Karsten Fells Primary Cardiologist: Dr Armanda Magic previously. Primary HF: Bensimhon  Subjective:    Successfully extubated 09/24/14 after weaning off NO. Swan d/c 09/25/14 On milrinone 0.375 and Lasix 15 mg/hr, got metolazone yesterday.   She diuresed well yesterday, very negative I/Os. Not reflected in weight but suspect inaccurate bed weight.  Feels ok today, no complaints.  Co-ox 79%.   Objective:   Weight Range: 198 lb 6.6 oz (90 kg) Body mass index is 35.16 kg/(m^2).   Vital Signs:   Temp:  [97 F (36.1 C)-98.2 F (36.8 C)] 98.2 F (36.8 C) (09/17 0824) Pulse Rate:  [102-154] 124 (09/17 0800) Resp:  [0-31] 18 (09/17 0800) BP: (100-153)/(16-116) 110/74 mmHg (09/17 0800) SpO2:  [94 %-99 %] 97 % (09/17 0800) Weight:  [198 lb 6.6 oz (90 kg)] 198 lb 6.6 oz (90 kg) (09/17 0500) Last BM Date:  (pta)  Weight change: Filed Weights   09/25/14 0600 09/26/14 0500 09/27/14 0500  Weight: 194 lb 7.1 oz (88.2 kg) 199 lb 1.2 oz (90.3 kg) 198 lb 6.6 oz (90 kg)    Intake/Output:   Intake/Output Summary (Last 24 hours) at 09/27/14 0844 Last data filed at 09/27/14 0800  Gross per 24 hour  Intake 2129.34 ml  Output   5790 ml  Net -3660.66 ml     Physical Exam: General: NAD HEENT: Normal Neck: supple. RIJ swan. JVP 14 cm. Carotids 2+ bilat; no bruits. No lymphadenopathy or thryomegaly noted. R Cordis in place. Cor: PMI nondisplaced. Tachy. Irregularly irregular. 2/6 TR Lungs: CTA anteriorly Abdomen: soft, NT, ND. No HSM. No bruits or masses. +BS Extremities: no cyanosis, clubbing, rash. 1+ edema. SCDs in place. GU: Foley Neuro: Moves all 4 extremities without difficulty. No focal deficits. Affect flat but appropriate   Telemetry: Afib RVR 100s-120  Labs: CBC  Recent Labs  09/26/14 0520 09/27/14 0430  WBC 9.0 9.0  HGB 8.8*  9.5*  HCT 27.4* 29.7*  MCV 92.9 92.5  PLT 137* 170   Basic Metabolic Panel  Recent Labs  09/24/14 1625  09/26/14 0520 09/27/14 0430  NA  --   < > 135 133*  K  --   < > 4.1 3.3*  CL  --   < > 103 96*  CO2  --   < > 23 29  GLUCOSE  --   < > 99 122*  BUN  --   < > 20 16  CALCIUM  --   < > 7.8* 8.1*  MG 2.2  --   --   --   < > = values in this interval not displayed. Liver Function Tests  Recent Labs  09/26/14 0520 09/27/14 0430  AST 61* 51*  ALT 58* 53  ALKPHOS 106 115  BILITOT 1.4* 1.1  PROT 6.0* 5.6*  ALBUMIN 2.9* 2.7*   No results for input(s): LIPASE, AMYLASE in the last 72 hours. Cardiac Enzymes No results for input(s): CKTOTAL, CKMB, CKMBINDEX, TROPONINI in the last 72 hours.  BNP: BNP (last 3 results) No results for input(s): BNP in the last 8760 hours.  ProBNP (last 3 results) No results for input(s): PROBNP in the last 8760 hours.   D-Dimer No results for input(s): DDIMER in the last 72 hours. Hemoglobin A1C No results for input(s): HGBA1C in the last 72 hours. Fasting Lipid Panel No results for  input(s): CHOL, HDL, LDLCALC, TRIG, CHOLHDL, LDLDIRECT in the last 72 hours. Thyroid Function Tests No results for input(s): TSH, T4TOTAL, T3FREE, THYROIDAB in the last 72 hours.  Invalid input(s): FREET3  Other results:     Imaging/Studies:  Dg Chest Port 1 View  09/26/2014   CLINICAL DATA:  Shortness of Breath  EXAM: PORTABLE CHEST - 1 VIEW  COMPARISON:  09/25/2014  FINDINGS: Prior median sternotomy and valve replacement. Right central lines remain in place, right central line remains in place, unchanged. Interval removal of Swan-Ganz catheter. Cardiomegaly. Bilateral perihilar opacities likely reflect edema and have increased. Left lower lobe atelectasis or infiltrate present, stable. Suspect small effusions. No acute bony abnormality.  IMPRESSION: Interval removal of Swan-Ganz catheter.  Increasing perihilar opacities, likely edema. Continued left  lower lobe atelectasis or infiltrate.   Electronically Signed   By: Charlett Nose M.D.   On: 09/26/2014 08:07    Latest Echo  Latest Cath   Medications:     Scheduled Medications: . acetaminophen  1,000 mg Oral 4 times per day   Or  . acetaminophen (TYLENOL) oral liquid 160 mg/5 mL  1,000 mg Per Tube 4 times per day  . antiseptic oral rinse  7 mL Mouth Rinse BID  . antiseptic oral rinse  7 mL Mouth Rinse QID  . aspirin EC  81 mg Oral Daily  . bisacodyl  10 mg Oral Daily   Or  . bisacodyl  10 mg Rectal Daily  . chlorpheniramine-HYDROcodone  5 mL Oral Q12H  . digoxin  0.125 mg Intravenous Daily  . docusate sodium  200 mg Oral Daily  . Influenza vac split quadrivalent PF  0.5 mL Intramuscular Tomorrow-1000  . insulin aspart  0-24 Units Subcutaneous 6 times per day  . metoCLOPramide (REGLAN) injection  10 mg Intravenous 4 times per day  . metolazone  5 mg Oral Daily  . metoprolol succinate  25 mg Oral Daily  . pantoprazole  40 mg Oral Daily  . sildenafil  60 mg Oral TID  . sodium chloride  3 mL Intravenous Q12H    Infusions: . sodium chloride 20 mL/hr at 09/25/14 1000  . sodium chloride    . sodium chloride 20 mL/hr at 09/27/14 0800  . amiodarone 30 mg/hr (09/27/14 0700)  . furosemide (LASIX) infusion 15 mg/hr (09/27/14 0700)  . lactated ringers Stopped (09/23/14 2300)  . milrinone 0.375 mcg/kg/min (09/27/14 0700)  . norepinephrine (LEVOPHED) Adult infusion Stopped (09/25/14 0615)    PRN Medications: sodium chloride, Gerhardt's butt cream, metoprolol, midazolam, morphine injection, ondansetron (ZOFRAN) IV, oxyCODONE, promethazine, sodium chloride, traMADol   Assessment/Plan   1. 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 H/o AVR (bioprosthesis) and MV ring 2012 3. Pulmonary HTN with cor pulmonale and RV dysfunction: Echo with EF 60-65%, stable bioprosthetic AVR and MV repair, moderately dilated and moderate to severely dysfunctional RV with  severe TR.  4. AKI 5. Persistent atrial fibrillation with RVR.  6. Acute respiratory failure: Resolved.  7. Pulmonary arterial hypertension.   Continue amiodarone gtt + digoxin + Toprol XL for rate control.  Will accept HR < 125 for now while we diurese her.  Avoiding anticoagulation due to aortic hematoma on admit. Continue SCDs.   Diuresed well yesterday on Lasix gtt at 15 with metolazone 5 daily.  Continue this regimen today.  She remains quite volume overloaded with very elevated JVP due to primarily RV failure.  No CVP currently.  - Continue milrinone for RV support and  pulmonary HTN.  Good co-ox today.  - Revatio for pulmonary hypertension.  - Needs ongoing diuresis as above.  - Follow CVP. - Replete K, repeat BMET in pm.  Add spironolactone 12.5 daily.   Length of Stay: 7  Critical Care Time devoted to patient care services described in this note is 35 Minutes.  Dalton McLean,MD 8:44 AM 09/27/2014   .

## 2014-09-27 NOTE — Progress Notes (Signed)
  Amiodarone Drug - Drug Interaction Consult Note  Recommendations:  Monitor patient for bradycardia while on amio and metoprolol and for signs/symptoms of dig toxicity while concurrently on digoxin.   Amiodarone is metabolized by the cytochrome P450 system and therefore has the potential to cause many drug interactions. Amiodarone has an average plasma half-life of 50 days (range 20 to 100 days).   There is potential for drug interactions to occur several weeks or months after stopping treatment and the onset of drug interactions may be slow after initiating amiodarone.    Statins: Increased risk of myopathy. Simvastatin- restrict dose to  daily. Other statins: counsel patients to report any muscle pain or weakness immediately.   Anticoagulants: Amiodarone can increase anticoagulant effect. Consider warfarin dose reduction. Patients should be monitored closely and the dose of anticoagulant altered accordingly, remembering that amiodarone levels take several weeks to stabilize.   Antiepileptics: Amiodarone can increase plasma concentration of phenytoin, the dose should be reduced. Note that small changes in phenytoin dose can result in large changes in levels. Monitor patient and counsel on signs of toxicity.   Beta blockers: increased risk of bradycardia, AV block and myocardial depression. Sotalol - avoid concomitant use.    Calcium channel blockers (diltiazem and verapamil): increased risk of bradycardia, AV block and myocardial depression.    Cyclosporine: Amiodarone increases levels of cyclosporine. Reduced dose of cyclosporine is recommended.   Digoxin dose should be halved when amiodarone is started.   Diuretics: increased risk of cardiotoxicity if hypokalemia occurs.   Oral hypoglycemic agents (glyburide, glipizide, glimepiride): increased risk of hypoglycemia. Patient's glucose levels should be monitored closely when initiating amiodarone therapy.     Drugs that prolong the QT interval:  Torsades de pointes risk may be increased with concurrent use - avoid if possible.  Monitor QTc, also keep magnesium/potassium WNL if concurrent therapy can't be avoided. Marland Kitchen Antibiotics: e.g. fluoroquinolones, erythromycin. . Antiarrhythmics: e.g. quinidine, procainamide, disopyramide, sotalol. . Antipsychotics: e.g. phenothiazines, haloperidol.  . Lithium, tricyclic antidepressants, and methadone.     Ashley [name]  09/27/2014 7:38 PM

## 2014-09-27 NOTE — Progress Notes (Addendum)
Patient ID: Ashley [name], female   DOB: 12/11/1940, 74 y.o.   MRN: [id number] EVENING ROUNDS NOTE :     [address]       [place] [id number]             [phone]                 4 Days Post-Op Procedure(s) (LRB): REDO STERNOTOMY (N/A) REPAIR OF ASCENDING AORTIC PSEUDOANEURYSM (N/A) TRANSESOPHAGEAL ECHOCARDIOGRAM (TEE) (N/A)  Total Length of Stay:  LOS: 7 days  BP 109/72 mmHg  Pulse 128  Temp(Src) 98.2 F (36.8 C) (Oral)  Resp 0  Ht  (1.6 m)  Wt 198 lb 6.6 oz (90 kg)  BMI 35.16 kg/m2  SpO2 99%  .Intake/Output      09/17 0701 - 09/18 0700   P.O.    I.V. (mL/kg) 479.8 (5.3)   IV Piggyback 400   Total Intake(mL/kg) 879.8 (9.8)   Urine (mL/kg/hr) 1550 (1.4)   Chest Tube    Total Output 1550   Net -670.2         . sodium chloride 20 mL/hr at 09/25/14 1000  . sodium chloride    . sodium chloride 20 mL/hr at 09/27/14 0800  . amiodarone 30 mg/hr (09/27/14 1800)  . furosemide (LASIX) infusion 15 mg/hr (09/27/14 1800)  . lactated ringers Stopped (09/23/14 2300)  . milrinone 0.375 mcg/kg/min (09/27/14 1800)  . norepinephrine (LEVOPHED) Adult infusion Stopped (09/25/14 0615)     Lab Results  Component Value Date   WBC 9.0 09/27/2014   HGB 9.5* 09/27/2014   HCT 29.7* 09/27/2014   PLT 170 09/27/2014   GLUCOSE 122* 09/27/2014   CHOL 169 10/11/2010   TRIG 100 10/11/2010   HDL 47 10/11/2010   LDLCALC 102* 10/11/2010   ALT 53 09/27/2014   AST 51* 09/27/2014   NA 133* 09/27/2014   K 3.3* 09/27/2014   CL 96* 09/27/2014   CREATININE 1.14* 09/27/2014   BUN 16 09/27/2014   CO2 29 09/27/2014   TSH 15.189* 10/11/2010   INR 1.28 09/20/2014   HGBA1C 6.8* 09/22/2014   Last recorded tsh elevated, will check now on cordrone Still afib with poor rate control Co constipation , small stool with small amount blood- guiac sent. Monitor for further bleeding  [name] MD  Beeper [phone] Office [phone] 09/27/2014 7:30 PM

## 2014-09-27 NOTE — Progress Notes (Signed)
Patients NO turned off at this time. A new Pence placed on patient at 2 Lpm with humidification. RN aware.

## 2014-09-27 NOTE — Progress Notes (Signed)
Patient ID: Ashley [name], female   DOB: 03-12-1940, 74 y.o.   MRN: [id number] TCTS DAILY ICU PROGRESS NOTE                   [address]            [place] [address]          [phone]   4 Days Post-Op Procedure(s) (LRB): REDO STERNOTOMY (N/A) REPAIR OF ASCENDING AORTIC PSEUDOANEURYSM (N/A) TRANSESOPHAGEAL ECHOCARDIOGRAM (TEE) (N/A)  Total Length of Stay:  LOS: 7 days   Subjective: Awake and alert, neuro intact  Objective: Vital signs in last 24 hours: Temp:  [97 F (36.1 C)-98.2 F (36.8 C)] 98.2 F (36.8 C) (09/17 0824) Pulse Rate:  [102-154] 130 (09/17 0900) Cardiac Rhythm:  [-] Atrial fibrillation (09/17 0800) Resp:  [0-31] 15 (09/17 0900) BP: (100-153)/(16-91) 126/85 mmHg (09/17 0900) SpO2:  [94 %-99 %] 96 % (09/17 0900) Weight:  [198 lb 6.6 oz (90 kg)] 198 lb 6.6 oz (90 kg) (09/17 0500)  Filed Weights   09/25/14 0600 09/26/14 0500 09/27/14 0500  Weight: 194 lb 7.1 oz (88.2 kg) 199 lb 1.2 oz (90.3 kg) 198 lb 6.6 oz (90 kg)    Weight change: -10.6 oz (-0.3 kg)   Hemodynamic parameters for last 24 hours:    Intake/Output from previous day: 09/16 0701 - 09/17 0700 In: 1897.6 [P.O.:400; I.V.:1297.6; IV Piggyback:200] Out: 5775 [Urine:5635; Chest Tube:140]  Intake/Output this shift: Total I/O In: 303.6 [I.V.:103.6; IV Piggyback:200] Out: 450 [Urine:450]  Current Meds: Scheduled Meds: . acetaminophen  1,000 mg Oral 4 times per day   Or  . acetaminophen (TYLENOL) oral liquid 160 mg/5 mL  1,000 mg Per Tube 4 times per day  . antiseptic oral rinse  7 mL Mouth Rinse BID  . antiseptic oral rinse  7 mL Mouth Rinse QID  . aspirin EC  81 mg Oral Daily  . bisacodyl  10 mg Oral Daily   Or  . bisacodyl  10 mg Rectal Daily  . chlorpheniramine-HYDROcodone  5 mL Oral Q12H  . digoxin  0.125 mg Intravenous Daily  . docusate sodium  200 mg Oral Daily  . Influenza vac split quadrivalent PF  0.5 mL Intramuscular Tomorrow-1000  . insulin aspart   0-24 Units Subcutaneous 6 times per day  . metoCLOPramide (REGLAN) injection  10 mg Intravenous 4 times per day  . metolazone  5 mg Oral Daily  . metoprolol succinate  25 mg Oral Daily  . pantoprazole  40 mg Oral Daily  . potassium chloride  10 mEq Intravenous Q1 Hr x 4  . sildenafil  60 mg Oral TID  . sodium chloride  3 mL Intravenous Q12H  . spironolactone  12.5 mg Oral Daily   Continuous Infusions: . sodium chloride 20 mL/hr at 09/25/14 1000  . sodium chloride    . sodium chloride 20 mL/hr at 09/27/14 0800  . amiodarone 30 mg/hr (09/27/14 0900)  . furosemide (LASIX) infusion 15 mg/hr (09/27/14 0900)  . lactated ringers Stopped (09/23/14 2300)  . milrinone 0.375 mcg/kg/min (09/27/14 0900)  . norepinephrine (LEVOPHED) Adult infusion Stopped (09/25/14 0615)   PRN Meds:.sodium chloride, Gerhardt's butt cream, metoprolol, midazolam, morphine injection, ondansetron (ZOFRAN) IV, oxyCODONE, promethazine, sodium chloride, traMADol  General appearance: alert and cooperative Neurologic: intact Heart: irregularly irregular rhythm Lungs: diminished breath sounds bibasilar Abdomen: soft, non-tender; bowel sounds normal; no masses,  no organomegaly Extremities: extremities normal, atraumatic, no cyanosis or edema and Homans sign  is negative, no sign of DVT Wound: sternum stable  Lab Results: CBC: Recent Labs  09/26/14 0520 09/27/14 0430  WBC 9.0 9.0  HGB 8.8* 9.5*  HCT 27.4* 29.7*  PLT 137* 170   BMET:  Recent Labs  09/26/14 0520 09/27/14 0430  NA 135 133*  K 4.1 3.3*  CL 103 96*  CO2 23 29  GLUCOSE 99 122*  BUN 20 16  CREATININE 1.20* 1.14*  CALCIUM 7.8* 8.1*    PT/INR: No results for input(s): LABPROT, INR in the last 72 hours. Radiology: Dg Chest Port 1 View  09/27/2014   CLINICAL DATA:  Shortness of breath.  EXAM: PORTABLE CHEST - 1 VIEW  COMPARISON:  09/26/2014  FINDINGS: Interval removal of 1 of the right IJ catheters. The mediastinal drain tubes are stable. No  pneumothorax. Persistent postoperative pulmonary edema and areas of atelectasis. Small left effusion.  IMPRESSION: Stable postoperative support apparatus.  Persistent perihilar edema and atelectasis.  No pneumothorax.   Electronically Signed   By: Rudie Meyer M.D.   On: 09/27/2014 09:20     Assessment/Plan: S/P Procedure(s) (LRB): REDO STERNOTOMY (N/A) REPAIR OF ASCENDING AORTIC PSEUDOANEURYSM (N/A) TRANSESOPHAGEAL ECHOCARDIOGRAM (TEE) (N/A) Mobilize Diuresis Diabetes control d/c tubes/lines Continue foley due to diuresing patient, strict I&O, patient in ICU and urinary output monitoring Wean off NO D/c chest tube    [name] 09/27/2014 11:37 AM

## 2014-09-28 ENCOUNTER — Inpatient Hospital Stay (HOSPITAL_COMMUNITY): Payer: Medicare Other

## 2014-09-28 DIAGNOSIS — I719 Aortic aneurysm of unspecified site, without rupture: Secondary | ICD-10-CM

## 2014-09-28 LAB — COMPREHENSIVE METABOLIC PANEL
ALT: 41 U/L (ref 14–54)
AST: 37 U/L (ref 15–41)
Albumin: 2.7 g/dL — ABNORMAL LOW (ref 3.5–5.0)
Alkaline Phosphatase: 113 U/L (ref 38–126)
Anion gap: 8 (ref 5–15)
BUN: 11 mg/dL (ref 6–20)
CO2: 33 mmol/L — ABNORMAL HIGH (ref 22–32)
Calcium: 8.5 mg/dL — ABNORMAL LOW (ref 8.9–10.3)
Chloride: 92 mmol/L — ABNORMAL LOW (ref 101–111)
Creatinine, Ser: 1.2 mg/dL — ABNORMAL HIGH (ref 0.44–1.00)
GFR calc Af Amer: 50 mL/min — ABNORMAL LOW (ref >60)
GFR calc non Af Amer: 43 mL/min — ABNORMAL LOW (ref >60)
Glucose, Bld: 121 mg/dL — ABNORMAL HIGH (ref 65–99)
Potassium: 3.4 mmol/L — ABNORMAL LOW (ref 3.5–5.1)
Sodium: 133 mmol/L — ABNORMAL LOW (ref 135–145)
Total Bilirubin: 1 mg/dL (ref 0.3–1.2)
Total Protein: 5.9 g/dL — ABNORMAL LOW (ref 6.5–8.1)

## 2014-09-28 LAB — GLUCOSE, CAPILLARY
GLUCOSE-CAPILLARY: 115 mg/dL — AB (ref 65–99)
GLUCOSE-CAPILLARY: 116 mg/dL — AB (ref 65–99)
Glucose-Capillary: 117 mg/dL — ABNORMAL HIGH (ref 65–99)
Glucose-Capillary: 120 mg/dL — ABNORMAL HIGH (ref 65–99)
Glucose-Capillary: 135 mg/dL — ABNORMAL HIGH (ref 65–99)

## 2014-09-28 LAB — CBC
HCT: 30.2 % — ABNORMAL LOW (ref 36.0–46.0)
Hemoglobin: 9.9 g/dL — ABNORMAL LOW (ref 12.0–15.0)
MCH: 30.2 pg (ref 26.0–34.0)
MCHC: 32.8 g/dL (ref 30.0–36.0)
MCV: 92.1 fL (ref 78.0–100.0)
Platelets: 196 10*3/uL (ref 150–400)
RBC: 3.28 MIL/uL — ABNORMAL LOW (ref 3.87–5.11)
RDW: 15.6 % — ABNORMAL HIGH (ref 11.5–15.5)
WBC: 10 10*3/uL (ref 4.0–10.5)

## 2014-09-28 LAB — ANAEROBIC CULTURE: Gram Stain: NONE SEEN

## 2014-09-28 LAB — CARBOXYHEMOGLOBIN
CARBOXYHEMOGLOBIN: 1.7 % — AB (ref 0.5–1.5)
Methemoglobin: 0.7 % (ref 0.0–1.5)
O2 SAT: 71.6 %
TOTAL HEMOGLOBIN: 9.2 g/dL — AB (ref 12.0–16.0)

## 2014-09-28 LAB — DIGOXIN LEVEL: Digoxin Level: 0.6 ng/mL — ABNORMAL LOW (ref 0.8–2.0)

## 2014-09-28 LAB — MAGNESIUM: Magnesium: 1.3 mg/dL — ABNORMAL LOW (ref 1.7–2.4)

## 2014-09-28 LAB — TSH: TSH: 5.695 u[IU]/mL — ABNORMAL HIGH (ref 0.350–4.500)

## 2014-09-28 MED ORDER — POTASSIUM CHLORIDE 10 MEQ/50ML IV SOLN
INTRAVENOUS | Status: AC
  Filled 2014-09-28: qty 50

## 2014-09-28 MED ORDER — POTASSIUM CHLORIDE 10 MEQ/50ML IV SOLN
10.0000 meq | INTRAVENOUS | Status: AC
  Administered 2014-09-28 (×3): 10 meq via INTRAVENOUS
  Filled 2014-09-28 (×2): qty 50

## 2014-09-28 MED ORDER — POTASSIUM CHLORIDE 10 MEQ/50ML IV SOLN
10.0000 meq | INTRAVENOUS | Status: AC
  Administered 2014-09-28 (×3): 10 meq via INTRAVENOUS
  Filled 2014-09-28 (×3): qty 50

## 2014-09-28 MED ORDER — METOPROLOL SUCCINATE ER 25 MG PO TB24
25.0000 mg | ORAL_TABLET | Freq: Two times a day (BID) | ORAL | Status: DC
  Administered 2014-09-28 – 2014-09-30 (×5): 25 mg via ORAL
  Filled 2014-09-28 (×7): qty 1

## 2014-09-28 MED ORDER — MAGNESIUM SULFATE 4 GM/100ML IV SOLN
4.0000 g | Freq: Once | INTRAVENOUS | Status: AC
  Administered 2014-09-28: 4 g via INTRAVENOUS
  Filled 2014-09-28: qty 100

## 2014-09-28 NOTE — Progress Notes (Signed)
Patient ID: Ashley [name], female   DOB: 1940/08/01, 74 y.o.   MRN: [id number] TCTS DAILY ICU PROGRESS NOTE                   [address]            [place] [address]          [phone]   5 Days Post-Op Procedure(s) (LRB): REDO STERNOTOMY (N/A) REPAIR OF ASCENDING AORTIC PSEUDOANEURYSM (N/A) TRANSESOPHAGEAL ECHOCARDIOGRAM (TEE) (N/A)  Total Length of Stay:  LOS: 8 days   Subjective: Awake and alert, feels weak, some nausea  Objective: Vital signs in last 24 hours: Temp:  [97.6 F (36.4 C)-98.2 F (36.8 C)] 97.8 F (36.6 C) (09/18 0743) Pulse Rate:  [99-140] 105 (09/18 0900) Cardiac Rhythm:  [-] Atrial fibrillation (09/18 0715) Resp:  [0-26] 15 (09/18 0900) BP: (88-134)/(50-96) 107/70 mmHg (09/18 0900) SpO2:  [95 %-100 %] 99 % (09/18 0900) Weight:  [185 lb 6.5 oz (84.1 kg)] 185 lb 6.5 oz (84.1 kg) (09/18 0700)  Filed Weights   09/26/14 0500 09/27/14 0500 09/28/14 0700  Weight: 199 lb 1.2 oz (90.3 kg) 198 lb 6.6 oz (90 kg) 185 lb 6.5 oz (84.1 kg)    Weight change: -13 lb 0.1 oz (-5.9 kg)   Hemodynamic parameters for last 24 hours: CVP:  [6 mmHg-21 mmHg] 16 mmHg  Intake/Output from previous day: 09/17 0701 - 09/18 0700 In: 1339.8 [P.O.:460; I.V.:479.8; IV Piggyback:400] Out: 3710 [Urine:3710]  Intake/Output this shift: Total I/O In: 1298.6 [P.O.:120; I.V.:723.6; Other:275; IV Piggyback:180] Out: 185 [Urine:185]  Current Meds: Scheduled Meds: . acetaminophen  1,000 mg Oral 4 times per day   Or  . acetaminophen (TYLENOL) oral liquid 160 mg/5 mL  1,000 mg Per Tube 4 times per day  . antiseptic oral rinse  7 mL Mouth Rinse BID  . antiseptic oral rinse  7 mL Mouth Rinse QID  . aspirin EC  81 mg Oral Daily  . bisacodyl  10 mg Oral Daily   Or  . bisacodyl  10 mg Rectal Daily  . chlorpheniramine-HYDROcodone  5 mL Oral Q12H  . digoxin  0.125 mg Intravenous Daily  . docusate sodium  200 mg Oral Daily  . Influenza vac split quadrivalent PF  0.5  mL Intramuscular Tomorrow-1000  . insulin aspart  0-24 Units Subcutaneous 6 times per day  . magnesium sulfate 1 - 4 g bolus IVPB  4 g Intravenous Once  . metoCLOPramide (REGLAN) injection  10 mg Intravenous 4 times per day  . metolazone  5 mg Oral Daily  . metoprolol succinate  25 mg Oral BID  . pantoprazole  40 mg Oral Daily  . potassium chloride  10 mEq Intravenous Q1 Hr x 3  . potassium chloride  10 mEq Intravenous Q1 Hr x 3  . sildenafil  60 mg Oral TID  . sodium chloride  3 mL Intravenous Q12H  . spironolactone  12.5 mg Oral Daily   Continuous Infusions: . sodium chloride 20 mL/hr at 09/25/14 1000  . sodium chloride    . sodium chloride 20 mL/hr at 09/28/14 0900  . amiodarone 30 mg/hr (09/28/14 0900)  . furosemide (LASIX) infusion 15 mg/hr (09/28/14 0900)  . lactated ringers Stopped (09/23/14 2300)  . milrinone 0.375 mcg/kg/min (09/28/14 0900)  . norepinephrine (LEVOPHED) Adult infusion Stopped (09/25/14 0615)   PRN Meds:.sodium chloride, Gerhardt's butt cream, metoprolol, midazolam, morphine injection, ondansetron (ZOFRAN) IV, oxyCODONE, promethazine, sodium chloride, traMADol  General appearance:  alert and cooperative Neurologic: intact Heart: irregularly irregular rhythm Lungs: diminished breath sounds bibasilar Abdomen: hypoactive bowel sounds, abdomen more distended then yesterday not tender Extremities: extremities normal, atraumatic, no cyanosis or edema and Homans sign is negative, no sign of DVT Wound: sternum stable  Lab Results: CBC: Recent Labs  09/27/14 0430 09/28/14 0455  WBC 9.0 10.0  HGB 9.5* 9.9*  HCT 29.7* 30.2*  PLT 170 196   BMET:  Recent Labs  09/27/14 2115 09/28/14 0455  NA 129* 133*  K 3.2* 3.4*  CL 89* 92*  CO2 30 33*  GLUCOSE 148* 121*  BUN 15 11  CREATININE 1.19* 1.20*  CALCIUM 8.4* 8.5*    PT/INR: No results for input(s): LABPROT, INR in the last 72 hours. Radiology: Dg Chest Port 1 View  09/28/2014   CLINICAL DATA:  Status  post cardiac surgery.  EXAM: PORTABLE CHEST - 1 VIEW  COMPARISON:  09/27/2014  FINDINGS: The right IJ catheter is stable. The cardiac silhouette, mediastinal and hilar contours are unchanged. The lungs demonstrate improved aeration with resolving edema and atelectasis. There is a persistent small left effusion.  IMPRESSION: Slight improved lung aeration with resolving edema and atelectasis.   Electronically Signed   By: Rudie Meyer M.D.   On: 09/28/2014 09:09     Assessment/Plan: S/P Procedure(s) (LRB): REDO STERNOTOMY (N/A) REPAIR OF ASCENDING AORTIC PSEUDOANEURYSM (N/A) TRANSESOPHAGEAL ECHOCARDIOGRAM (TEE) (N/A) Mobilize Diuresis Diabetes control Continue foley due to strict I&O, patient in ICU and urinary output monitoring Check abdominal film No further blood per rectum, h/h stable, history of colonoscopy dec 2015 per patient  Slow progress  Renal function stable still on lasix drip Still afib not on heparin  [name] 09/28/2014 9:39 AM

## 2014-09-28 NOTE — Clinical Social Work Note (Signed)
Clinical Social Work Assessment  Patient Details  Name: Ashley [name] MRN: [id number] Date of Birth: 04-02-1940  Date of referral:  09/28/14               Reason for consult:  Facility Placement                Permission sought to share information with:  Other Permission granted to share information::  Yes, Verbal Permission Granted  Name::        Agency::   ([place] [facility])  Relationship::     Contact Information:     Housing/Transportation Living arrangements for the past 2 months:  Single Family Home Source of Information:  Patient Patient Interpreter Needed:  None Criminal Activity/Legal Involvement Pertinent to Current Situation/Hospitalization:  No - Comment as needed Significant Relationships:  Adult Children Lives with:    Do you feel safe going back to the place where you live?  No Need for family participation in patient care:  No (Coment)  Care giving concerns: Pt lives alone and does not have 24 hour care.  She agrees with ST rehab stay prior to returning home.   Social Worker assessment / plan: CSW will begin bed search in [employer]. Per the pt's wishes.  CSW will facilitate NH tx as appropriate. Employment status:  Retired Database administrator PT Recommendations:  Skilled Nursing Facility Information / Referral to community resources:  Skilled Nursing Facility  Patient/Family's Response to care: Pt agreeable to SNF if covered by her insurance.   Patient/Family's Understanding of and Emotional Response to Diagnosis, Current Treatment, and Prognosis:  Pt lethargic during visit, speaking mostly with eyes shut.  She described her surgery as being "serious stuff" and her recovery being "hard." Emotional Assessment Appearance:  Appears stated age Attitude/Demeanor/Rapport:  Lethargic Affect (typically observed):  Calm Orientation:  Oriented to Self, Oriented to Place, Oriented to  Time, Oriented to Situation Alcohol / Substance use:  Never  Used Psych involvement (Current and /or in the community):  No (Comment)  Discharge Needs  Concerns to be addressed:  Discharge Planning Concerns Readmission within the last 30 days:    Current discharge risk:  None Barriers to Discharge:  Insurance Authorization   Linna Caprice, LCSW 09/28/2014, 12:22 PM

## 2014-09-28 NOTE — Progress Notes (Signed)
Patient complains about chest pain 6/10. Initially, patient contributed her pain due to strong cough.  Pain medication was given and patient was able to rest for couple hours. Patient woke up closer to 6 am and started complaining about chest pain again. This time, the patient states that "every time she takes breath in, it hurts more". Morphine was given to the patient to provide comfort and help with increased anxiety. Patient informs that Morphine helped slightly. Continue to monitor the patient.

## 2014-09-28 NOTE — Plan of Care (Signed)
Patient needs encouragement to stay up in chair and do IS. Patient not ambulated this am d/t a fib rate 120's. Will continue to assess.

## 2014-09-28 NOTE — Progress Notes (Signed)
Patient ID: Ashley [name], female   DOB: 02-21-1940, 74 y.o.   MRN: [id number] P Advanced Heart Failure Rounding Note  Primary Physician: Dr Karsten Fells Primary Cardiologist: Dr Armanda Magic previously. Primary HF: Bensimhon  Subjective:    Successfully extubated 09/24/14 after weaning off NO. Swan d/c 09/25/14 On milrinone 0.375 and Lasix 15 mg/hr, got metolazone yesterday.   She diuresed well again yesterday, negative I/Os and weight down.  Still nauseated and getting meds for this.  CVP remains 16, HR remains elevated 110s in atrial fibrillation.    Objective:   Weight Range: 185 lb 6.5 oz (84.1 kg) Body mass index is 32.85 kg/(m^2).   Vital Signs:   Temp:  [97.6 F (36.4 C)-98.2 F (36.8 C)] 97.8 F (36.6 C) (09/18 0743) Pulse Rate:  [99-140] 105 (09/18 0900) Resp:  [0-26] 15 (09/18 0900) BP: (88-134)/(50-96) 107/70 mmHg (09/18 0900) SpO2:  [95 %-100 %] 99 % (09/18 0900) Weight:  [185 lb 6.5 oz (84.1 kg)] 185 lb 6.5 oz (84.1 kg) (09/18 0700) Last BM Date: 09/27/14  Weight change: Filed Weights   09/26/14 0500 09/27/14 0500 09/28/14 0700  Weight: 199 lb 1.2 oz (90.3 kg) 198 lb 6.6 oz (90 kg) 185 lb 6.5 oz (84.1 kg)    Intake/Output:   Intake/Output Summary (Last 24 hours) at 09/28/14 0914 Last data filed at 09/28/14 0900  Gross per 24 hour  Intake 1534.8 ml  Output   3370 ml  Net -1835.2 ml     Physical Exam: General: NAD HEENT: Normal Neck: supple. RIJ swan. JVP 14 cm. Carotids 2+ bilat; no bruits. No lymphadenopathy or thryomegaly noted. R Cordis in place. Cor: PMI nondisplaced. Tachy. Irregularly irregular. 2/6 TR Lungs: CTA anteriorly Abdomen: soft, NT, ND. No HSM. No bruits or masses. +BS Extremities: no cyanosis, clubbing, rash. 1+ ankle edema. SCDs in place. GU: Foley Neuro: Moves all 4 extremities without difficulty. No focal deficits. Affect flat but appropriate   Telemetry: Afib RVR 100s-120  Labs: CBC  Recent Labs  09/27/14 0430  09/28/14 0455  WBC 9.0 10.0  HGB 9.5* 9.9*  HCT 29.7* 30.2*  MCV 92.5 92.1  PLT 170 196   Basic Metabolic Panel  Recent Labs  09/27/14 2115 09/28/14 0455  NA 129* 133*  K 3.2* 3.4*  CL 89* 92*  CO2 30 33*  GLUCOSE 148* 121*  BUN 15 11  CALCIUM 8.4* 8.5*  MG  --  1.3*   Liver Function Tests  Recent Labs  09/27/14 0430 09/28/14 0455  AST 51* 37  ALT 53 41  ALKPHOS 115 113  BILITOT 1.1 1.0  PROT 5.6* 5.9*  ALBUMIN 2.7* 2.7*   No results for input(s): LIPASE, AMYLASE in the last 72 hours. Cardiac Enzymes No results for input(s): CKTOTAL, CKMB, CKMBINDEX, TROPONINI in the last 72 hours.  BNP: BNP (last 3 results) No results for input(s): BNP in the last 8760 hours.  ProBNP (last 3 results) No results for input(s): PROBNP in the last 8760 hours.   D-Dimer No results for input(s): DDIMER in the last 72 hours. Hemoglobin A1C No results for input(s): HGBA1C in the last 72 hours. Fasting Lipid Panel No results for input(s): CHOL, HDL, LDLCALC, TRIG, CHOLHDL, LDLDIRECT in the last 72 hours. Thyroid Function Tests No results for input(s): TSH, T4TOTAL, T3FREE, THYROIDAB in the last 72 hours.  Invalid input(s): FREET3  Other results:     Imaging/Studies:  Dg Chest Port 1 View  09/28/2014   CLINICAL DATA:  Status  post cardiac surgery.  EXAM: PORTABLE CHEST - 1 VIEW  COMPARISON:  09/27/2014  FINDINGS: The right IJ catheter is stable. The cardiac silhouette, mediastinal and hilar contours are unchanged. The lungs demonstrate improved aeration with resolving edema and atelectasis. There is a persistent small left effusion.  IMPRESSION: Slight improved lung aeration with resolving edema and atelectasis.   Electronically Signed   By: Rudie Meyer M.D.   On: 09/28/2014 09:09   Dg Chest Port 1 View  09/27/2014   CLINICAL DATA:  Shortness of breath.  EXAM: PORTABLE CHEST - 1 VIEW  COMPARISON:  09/26/2014  FINDINGS: Interval removal of 1 of the right IJ catheters. The  mediastinal drain tubes are stable. No pneumothorax. Persistent postoperative pulmonary edema and areas of atelectasis. Small left effusion.  IMPRESSION: Stable postoperative support apparatus.  Persistent perihilar edema and atelectasis.  No pneumothorax.   Electronically Signed   By: Rudie Meyer M.D.   On: 09/27/2014 09:20    Latest Echo  Latest Cath   Medications:     Scheduled Medications: . acetaminophen  1,000 mg Oral 4 times per day   Or  . acetaminophen (TYLENOL) oral liquid 160 mg/5 mL  1,000 mg Per Tube 4 times per day  . antiseptic oral rinse  7 mL Mouth Rinse BID  . antiseptic oral rinse  7 mL Mouth Rinse QID  . aspirin EC  81 mg Oral Daily  . bisacodyl  10 mg Oral Daily   Or  . bisacodyl  10 mg Rectal Daily  . chlorpheniramine-HYDROcodone  5 mL Oral Q12H  . digoxin  0.125 mg Intravenous Daily  . docusate sodium  200 mg Oral Daily  . Influenza vac split quadrivalent PF  0.5 mL Intramuscular Tomorrow-1000  . insulin aspart  0-24 Units Subcutaneous 6 times per day  . magnesium sulfate 1 - 4 g bolus IVPB  4 g Intravenous Once  . metoCLOPramide (REGLAN) injection  10 mg Intravenous 4 times per day  . metolazone  5 mg Oral Daily  . metoprolol succinate  25 mg Oral BID  . pantoprazole  40 mg Oral Daily  . potassium chloride  10 mEq Intravenous Q1 Hr x 3  . potassium chloride  10 mEq Intravenous Q1 Hr x 3  . sildenafil  60 mg Oral TID  . sodium chloride  3 mL Intravenous Q12H  . spironolactone  12.5 mg Oral Daily    Infusions: . sodium chloride 20 mL/hr at 09/25/14 1000  . sodium chloride    . sodium chloride 20 mL/hr at 09/28/14 0900  . amiodarone 30 mg/hr (09/28/14 0900)  . furosemide (LASIX) infusion 15 mg/hr (09/28/14 0900)  . lactated ringers Stopped (09/23/14 2300)  . milrinone 0.375 mcg/kg/min (09/28/14 0900)  . norepinephrine (LEVOPHED) Adult infusion Stopped (09/25/14 0615)    PRN Medications: sodium chloride, Gerhardt's butt cream, metoprolol,  midazolam, morphine injection, ondansetron (ZOFRAN) IV, oxyCODONE, promethazine, sodium chloride, traMADol   Assessment/Plan   1. 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 H/o AVR (bioprosthesis) and MV ring 2012 3. 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  4. AKI 5. Persistent atrial fibrillation with RVR.  6. Acute respiratory failure: Resolved.  7. Pulmonary arterial hypertension.   Continue amiodarone gtt + digoxin + Toprol XL for rate control.  Increase Toprol XL to 25 bid.  Will accept HR <  125 for now while we diurese her.  Avoiding anticoagulation due to aortic hematoma on admit. Continue SCDs.   Diuresed well yesterday on Lasix gtt at 15 with metolazone 5 daily.  Continue this regimen today.  She remains quite volume overloaded with elevated JVP due to primarily RV failure. CVP 16 today.  - Continue milrinone for RV support and pulmonary HTN.  Good co-ox today.  - Revatio for pulmonary hypertension.  - Needs ongoing diuresis as above.  - Follow CVP. - Replete K and Mg, also on spironolactone 12.5 daily.   Length of Stay: 8  Brenetta Penny,MD 9:14 AM 09/28/2014   .

## 2014-09-28 NOTE — Clinical Social Work Placement (Signed)
   CLINICAL SOCIAL WORK PLACEMENT  NOTE  Date:  09/28/2014  Patient Details  Name: Ashley [name] MRN: [id number] Date of Birth: 07/02/1940  Clinical Social Work is seeking post-discharge placement for this patient at the Skilled  Nursing Facility level of care (*CSW will initial, date and re-position this form in  chart as items are completed):  No   Patient/family provided with [facility] Clinical Social Work Department's list of facilities offering this level of care within the geographic area requested by the patient (or if unable, by the patient's family).  Yes   Patient/family informed of their freedom to choose among providers that offer the needed level of care, that participate in Medicare, Medicaid or managed care program needed by the patient, have an available bed and are willing to accept the patient.  No   Patient/family informed of [facility]'s ownership interest in [facility] and [facility], as well as of the fact that they are under no obligation to receive care at these facilities.  PASRR submitted to EDS on 09/28/14     PASRR number received on 09/28/14     Existing PASRR number confirmed on       FL2 transmitted to all facilities in geographic area requested by pt/family on       FL2 transmitted to all facilities within larger geographic area on       Patient informed that his/her managed care company has contracts with or will negotiate with certain facilities, including the following:            Patient/family informed of bed offers received.  Patient chooses bed at       Physician recommends and patient chooses bed at      Patient to be transferred to   on  .  Patient to be transferred to facility by       Patient family notified on   of transfer.  Name of family member notified:        PHYSICIAN       Additional Comment:    _______________________________________________ Linna Caprice, LCSW 09/28/2014, 12:29 PM

## 2014-09-28 NOTE — Progress Notes (Signed)
Patient ID: Ashley [name], female   DOB: 05/29/1940, 74 y.o.   MRN: [id number] EVENING ROUNDS NOTE :     [address]       [place] [address]             [phone]                 5 Days Post-Op Procedure(s) (LRB): REDO STERNOTOMY (N/A) REPAIR OF ASCENDING AORTIC PSEUDOANEURYSM (N/A) TRANSESOPHAGEAL ECHOCARDIOGRAM (TEE) (N/A)  Total Length of Stay:  LOS: 8 days  BP 122/76 mmHg  Pulse 137  Temp(Src) 97.8 F (36.6 C) (Oral)  Resp 23  Ht  (1.6 m)  Wt 185 lb 6.5 oz (84.1 kg)  BMI 32.85 kg/m2  SpO2 92%  .Intake/Output      09/17 0701 - 09/18 0700 09/18 0701 - 09/19 0700   P.O. 460 530   I.V. (mL/kg) 479.8 (5.7) 618 (7.3)   IV Piggyback 400 330   Total Intake(mL/kg) 1339.8 (15.9) 1478 (17.6)   Urine (mL/kg/hr) 3710 (1.8) 1495 (1.6)   Chest Tube     Total Output 3710 1495   Net -2370.2 -17          . sodium chloride 20 mL/hr at 09/25/14 1000  . sodium chloride    . sodium chloride 20 mL/hr at 09/28/14 1700  . amiodarone 30 mg/hr (09/28/14 1600)  . furosemide (LASIX) infusion 15 mg/hr (09/28/14 1600)  . lactated ringers Stopped (09/23/14 2300)  . milrinone 0.375 mcg/kg/min (09/28/14 1724)  . norepinephrine (LEVOPHED) Adult infusion Stopped (09/25/14 0615)     Lab Results  Component Value Date   WBC 10.0 09/28/2014   HGB 9.9* 09/28/2014   HCT 30.2* 09/28/2014   PLT 196 09/28/2014   GLUCOSE 121* 09/28/2014   CHOL 169 10/11/2010   TRIG 100 10/11/2010   HDL 47 10/11/2010   LDLCALC 102* 10/11/2010   ALT 41 09/28/2014   AST 37 09/28/2014   NA 133* 09/28/2014   K 3.4* 09/28/2014   CL 92* 09/28/2014   CREATININE 1.20* 09/28/2014   BUN 11 09/28/2014   CO2 33* 09/28/2014   TSH 5.695* 09/28/2014   INR 1.28 09/20/2014   HGBA1C 6.8* 09/22/2014   Repeat tsh elevated 5.6 on amiodarone  Abdominal film done:   Dg Abd Portable 1v  09/28/2014   CLINICAL DATA:  74 year old female with a history of redo sternotomy, ascending aortic aneurysm  repair 09/23/2014.  EXAM: PORTABLE ABDOMEN - 1 VIEW  COMPARISON:  No recent abdominal plain film.  Prior CT 11/27/2007  FINDINGS: Gas within the right colon transverse colon, and sigmoid colon. Paucity of gas within the distal descending colon. No rectal gas.  Surgical changes of cholecystectomy, as well as abdominal hernia repair.  Epicardial pacing leads project over the left upper quadrant. Base of the chest not visualized.  No displaced fracture.  Pelvic thermister in place.  IMPRESSION: Single view demonstrates partial distention of the colon, with no evidence of obstruction. Changes may reflect colonic ileus, given the patient's history. If there is concern for acute abdominal process, CT would be recommended.  Surgical changes of the abdomen, as above.  Signed,  Yvone Neu. Loreta Ave, DO  [facility]  [facility]   Electronically Signed   By: Gilmer Mor D.O.   On: 09/28/2014 15:40    [name] MD  Beeper [phone] Office [phone] 09/28/2014 5:53 PM

## 2014-09-29 ENCOUNTER — Inpatient Hospital Stay (HOSPITAL_COMMUNITY): Payer: Medicare Other

## 2014-09-29 DIAGNOSIS — R5381 Other malaise: Secondary | ICD-10-CM

## 2014-09-29 DIAGNOSIS — I4891 Unspecified atrial fibrillation: Secondary | ICD-10-CM

## 2014-09-29 LAB — CBC
HCT: 31.5 % — ABNORMAL LOW (ref 36.0–46.0)
Hemoglobin: 9.9 g/dL — ABNORMAL LOW (ref 12.0–15.0)
MCH: 28.9 pg (ref 26.0–34.0)
MCHC: 31.4 g/dL (ref 30.0–36.0)
MCV: 91.8 fL (ref 78.0–100.0)
Platelets: 244 10*3/uL (ref 150–400)
RBC: 3.43 MIL/uL — ABNORMAL LOW (ref 3.87–5.11)
RDW: 15.8 % — ABNORMAL HIGH (ref 11.5–15.5)
WBC: 8.3 10*3/uL (ref 4.0–10.5)

## 2014-09-29 LAB — GLUCOSE, CAPILLARY
GLUCOSE-CAPILLARY: 106 mg/dL — AB (ref 65–99)
GLUCOSE-CAPILLARY: 111 mg/dL — AB (ref 65–99)
Glucose-Capillary: 106 mg/dL — ABNORMAL HIGH (ref 65–99)
Glucose-Capillary: 115 mg/dL — ABNORMAL HIGH (ref 65–99)
Glucose-Capillary: 131 mg/dL — ABNORMAL HIGH (ref 65–99)
Glucose-Capillary: 95 mg/dL (ref 65–99)

## 2014-09-29 LAB — CARBOXYHEMOGLOBIN
Carboxyhemoglobin: 1.6 % — ABNORMAL HIGH (ref 0.5–1.5)
Methemoglobin: 0.9 % (ref 0.0–1.5)
O2 Saturation: 65.8 %
TOTAL HEMOGLOBIN: 10.6 g/dL — AB (ref 12.0–16.0)

## 2014-09-29 LAB — COMPREHENSIVE METABOLIC PANEL
ALT: 35 U/L (ref 14–54)
AST: 34 U/L (ref 15–41)
Albumin: 2.6 g/dL — ABNORMAL LOW (ref 3.5–5.0)
Alkaline Phosphatase: 107 U/L (ref 38–126)
Anion gap: 7 (ref 5–15)
BUN: 14 mg/dL (ref 6–20)
CO2: 36 mmol/L — ABNORMAL HIGH (ref 22–32)
Calcium: 8.4 mg/dL — ABNORMAL LOW (ref 8.9–10.3)
Chloride: 87 mmol/L — ABNORMAL LOW (ref 101–111)
Creatinine, Ser: 1.3 mg/dL — ABNORMAL HIGH (ref 0.44–1.00)
GFR calc Af Amer: 46 mL/min — ABNORMAL LOW (ref >60)
GFR calc non Af Amer: 39 mL/min — ABNORMAL LOW (ref >60)
Glucose, Bld: 105 mg/dL — ABNORMAL HIGH (ref 65–99)
Potassium: 3.5 mmol/L (ref 3.5–5.1)
Sodium: 130 mmol/L — ABNORMAL LOW (ref 135–145)
Total Bilirubin: 0.8 mg/dL (ref 0.3–1.2)
Total Protein: 5.8 g/dL — ABNORMAL LOW (ref 6.5–8.1)

## 2014-09-29 LAB — POCT I-STAT, CHEM 8
BUN: 19 mg/dL (ref 6–20)
CHLORIDE: 84 mmol/L — AB (ref 101–111)
Calcium, Ion: 1.14 mmol/L (ref 1.13–1.30)
Creatinine, Ser: 1.4 mg/dL — ABNORMAL HIGH (ref 0.44–1.00)
Glucose, Bld: 119 mg/dL — ABNORMAL HIGH (ref 65–99)
HCT: 36 % (ref 36.0–46.0)
Hemoglobin: 12.2 g/dL (ref 12.0–15.0)
POTASSIUM: 3.9 mmol/L (ref 3.5–5.1)
Sodium: 129 mmol/L — ABNORMAL LOW (ref 135–145)
TCO2: 34 mmol/L (ref 0–100)

## 2014-09-29 LAB — MAGNESIUM: Magnesium: 2.1 mg/dL (ref 1.7–2.4)

## 2014-09-29 MED ORDER — ENOXAPARIN SODIUM 30 MG/0.3ML ~~LOC~~ SOLN
30.0000 mg | SUBCUTANEOUS | Status: DC
  Filled 2014-09-29 (×2): qty 0.3

## 2014-09-29 MED ORDER — CETYLPYRIDINIUM CHLORIDE 0.05 % MT LIQD
7.0000 mL | Freq: Two times a day (BID) | OROMUCOSAL | Status: DC
  Administered 2014-09-29 – 2014-10-06 (×11): 7 mL via OROMUCOSAL

## 2014-09-29 MED ORDER — POTASSIUM CHLORIDE 10 MEQ/50ML IV SOLN
10.0000 meq | INTRAVENOUS | Status: AC
  Administered 2014-09-29 (×3): 10 meq via INTRAVENOUS
  Filled 2014-09-29 (×3): qty 50

## 2014-09-29 MED ORDER — METOLAZONE 5 MG PO TABS
5.0000 mg | ORAL_TABLET | Freq: Once | ORAL | Status: AC
  Administered 2014-09-29: 5 mg via ORAL
  Filled 2014-09-29 (×2): qty 1

## 2014-09-29 MED ORDER — CEPHALEXIN 500 MG PO CAPS
500.0000 mg | ORAL_CAPSULE | Freq: Three times a day (TID) | ORAL | Status: DC
  Administered 2014-09-29 – 2014-10-05 (×19): 500 mg via ORAL
  Filled 2014-09-29 (×22): qty 1

## 2014-09-29 MED ORDER — ENOXAPARIN SODIUM 30 MG/0.3ML ~~LOC~~ SOLN
30.0000 mg | SUBCUTANEOUS | Status: DC
  Administered 2014-09-29 – 2014-10-05 (×7): 30 mg via SUBCUTANEOUS
  Filled 2014-09-29 (×8): qty 0.3

## 2014-09-29 NOTE — Plan of Care (Signed)
Problem: Phase II Progression Outcomes Goal: Progress activity as tolerated unless otherwise ordered Outcome: Progressing Ambulated 120 ft x2 today. Only stopped 1 time with each ambulated. Ambulated on 3L Goal: Vital signs remain stable Outcome: Progressing Continues in afib with rates 100-120's

## 2014-09-29 NOTE — Progress Notes (Signed)
Rehab Admissions Coordinator Note:  Patient was screened by Trish Mage for appropriateness for an Inpatient Acute Rehab Consult.  At this time, we are recommending Inpatient Rehab consult.  Trish Mage 09/29/2014, 3:05 PM  I can be reached at [phone].

## 2014-09-29 NOTE — Consult Note (Signed)
Physical Medicine and Rehabilitation Consult  Reason for Consult: Debility Referring Physician: Dr. Donata Clay   HPI: Ashley [name] is a 74 y.o. female with history of HTN, PAF, s/p  MAZE and AVR 04/2010 who was admitted to OSH on 09/19/14 with chest pain and cough.  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  Coumadin reversed and patient was taken to OR for redo sternotomy and repair of pseudoaneurysm on 09/23/14 by Dr. Cornelius Moras. Dr. Gala Romney consulted to help manage fluid overload and PAF.  She was stated on milrinone as well as IV diuresis.   She tolerated extubation on 09/14 and nausea due to constipation resolved but po intake remains poor.  She continue to have A fib with RVR despite addition of BB as well as digoxin and amiodarone. Therapy ongoing and patient noted to have delayed processing with disorientation as well as problems with mobility. CIR recommended for follow up therapy.     Review of Systems  HENT: Negative for hearing loss.   Eyes: Negative for blurred vision and double vision.  Respiratory: Positive for shortness of breath. Negative for cough.   Cardiovascular: Positive for chest pain (chest wall tenderness).  Gastrointestinal: Negative for heartburn, nausea and abdominal pain.  Genitourinary: Negative for hematuria.  Musculoskeletal: Positive for myalgias, back pain and joint pain.  Skin: Negative for itching and rash.  Neurological: Positive for dizziness (with positional changes. ). Negative for speech change, focal weakness and headaches.      Past Medical History  Diagnosis Date  . HTN (hypertension)   . Dyslipidemia   . GERD (gastroesophageal reflux disease)   . Mitral regurgitation     on recent TEE 07/07/2010  . DJD (degenerative joint disease), cervical     s/p epidural steroid injections  . Hiatal hernia   . Osteopenia    frax neg 11/10, repeat in 2-4 years DJD C spine Dr. Ethelene Hal s/p epidural steroid injections   . Paroxysmal atrial fibrillation     on coumadin  . Diastolic dysfunction   . Rheumatic aortic stenosis     I will get an echo today to evaluate her murmur  . Heart murmur   . Diverticulitis     Past Surgical History  Procedure Laterality Date  . Total vaginal hysterectomy    . Cholecystectomy    . Left knee surgery    . Bilateral shoulder surgery    . Umbilical hernia repair x2    . Mitral valve repair  04/29/2010    26-mm Edwards annuloplasty ring and posterior commissure plasty, Dr Donata Clay   . Aortic valve replacement  04/29/2010    19 mm supra-annular pericardial tissue valve(serial number [id number]) Dr Donata Clay  . Cox-maze microwave ablation  04/29/2010    left sided, Dr Donata Clay  . Delayed sternal closure  04/30/2010    Dr Donata Clay  . Cardiac catheterization  04/27/2010    Dr Armanda Magic  . Cardioverted  05/19/2010    Dr Donato Schultz  . C5-c6 fusion    . Rf catheter ablation of afib  11/2009  . US echocardiography  07-2010, 05-04-2011    moderate AS of AVR, mo MR, mild pulm HTN RSVP 50-31mmHg, grade3, 05-04-2011 Echo normal LVF, stable MV ring with mild MS, mild LAE, mild PR, mod TRmoderate pulmonary HTN, prosthetic AVR with mild AS, grade 2 diastolic  dysfunction  . Replacement ascending aorta N/A 09/23/2014    Procedure: REPAIR OF ASCENDING AORTIC PSEUDOANEURYSM;  Surgeon: Kerin Perna, MD;  Location: [facility] OR;  Service: Open Heart Surgery;  Laterality: N/A;  Nitrous oxide  . Tee without cardioversion N/A 09/23/2014    Procedure: TRANSESOPHAGEAL ECHOCARDIOGRAM (TEE);  Surgeon: Kerin Perna, MD;  Location: [facility] OR;  Service: Open Heart Surgery;  Laterality: N/A;    Family History  Problem Relation Age of Onset  . Diabetes type II    . Coronary artery disease Father     s/p cabg in the 1970's  . Heart disease    . Heart disease Father   . Heart attack Mother   . Hypertension  Brother   . Hypertension Sister   . Parkinsonism Sister      Social History:   Lives alone. Still works part time as a [employer] Services operational officer.  Family to provide assistance after discharge?  She  reports that she has quit smoking years ago. Her smoking use included Cigarettes. She does not have any smokeless tobacco history on file. She reports that she does not drink alcohol or use illicit drugs.     Allergies  Allergen Reactions  . Codeine Nausea And Vomiting  . Darvocet [Propoxyphene N-Acetaminophen] Nausea And Vomiting  . Iodine Other (See Comments)    Pt does not recall this reaction  . Lisinopril Other (See Comments)    Possible cough    Medications Prior to Admission  Medication Sig Dispense Refill  . acetaminophen (TYLENOL) 325 MG tablet Take 650 mg by mouth every 6 (six) hours as needed (pain).     Marland Kitchen alendronate (FOSAMAX) 70 MG tablet Take 70 mg by mouth once a week. On Saturdays  11  . amoxicillin (AMOXIL) 875 MG tablet Take 875 mg by mouth 2 (two) times daily. 10 day course started 09/16/14  0  . Calcium Carb-Cholecalciferol (CALCIUM + D3 PO) Take 1 tablet by mouth daily. Calcium 1200 mg    . carvedilol (COREG) 25 MG tablet Take 25 mg by mouth 2 (two) times daily with a meal.   6  . cyclobenzaprine (FLEXERIL) 10 MG tablet Take 10 mg by mouth 3 (three) times daily as needed.  0  . ferrous sulfate 325 (65 FE) MG EC tablet Take 325 mg by mouth 2 (two) times daily with a meal.      . furosemide (LASIX) 40 MG tablet Take 40 mg by mouth See admin instructions. Take 1 tablet (40 mg) by mouth with breakfast and supper, may take an additional tablet (40 mg) at bedtime as needed for leg swelling  6  . HYDROcodone-acetaminophen (NORCO/VICODIN) 5-325 MG per tablet Take 1 tablet by mouth every 4 (four) hours as needed.  0  . lisinopril (PRINIVIL,ZESTRIL) 40 MG tablet Take 40 mg by mouth daily with supper.   11  . meclizine (ANTIVERT) 25 MG tablet Take 25 mg by mouth 3 (three) times daily as needed.  0    . montelukast (SINGULAIR) 10 MG tablet Take 10 mg by mouth daily with supper.  11  . pantoprazole (PROTONIX) 40 MG tablet Take 40 mg by mouth daily.      . potassium chloride SA (K-DUR,KLOR-CON) 20 MEQ tablet Take 20 mEq by mouth daily with supper.    . pravastatin (PRAVACHOL) 20 MG tablet Take 20 mg by mouth daily with supper.   11  . warfarin (COUMADIN) 2.5 MG tablet Take 2.5-3.75 mg by mouth daily with supper. Take  1 tablet (2.5 mg) by mouth on 1st day, then take 1 1/2 tablets (3.75 mg) on 2nd day, then repeat      Home: Home Living Family/patient expects to be discharged to:: Private residence Living Arrangements: Alone Available Help at Discharge: Family, Available 24 hours/day (children (per Case Mgr note-pt unsure)) Type of Home: Apartment Home Access: Level entry Home Layout: One level Bathroom Shower/Tub: Tub/shower unit, Curtain Bathroom Accessibility: Yes Home Equipment: Walker - 2 wheels, Tub bench  Functional History: Prior Function Level of Independence: Independent Functional Status:  Mobility: Bed Mobility Overal bed mobility: Needs Assistance Bed Mobility: Rolling, Sidelying to Sit Rolling: Mod assist Sidelying to sit: Mod assist Supine to sit: Max assist, +2 for physical assistance, +2 for safety/equipment, HOB elevated General bed mobility comments: incr time and cues; vc to maintain sternal precautions; assist to scoot to EOB Transfers Overall transfer level: Needs assistance Equipment used: 1 person hand held assist Transfers: Sit to/from Stand Sit to Stand: From elevated surface, Min assist Stand pivot transfers: Min assist, +2 safety/equipment General transfer comment: pt anxious with max encouragement; sit to stand x 2 needing just as many cues for 2nd attempt (for setup, sternal precautions) Ambulation/Gait General Gait Details: unable with 1 person assist due to lines (RN not available)    ADL:    Cognition: Cognition Overall Cognitive Status:  No family/caregiver present to determine baseline cognitive functioning Orientation Level: Oriented X4 Cognition Arousal/Alertness: Awake/alert Behavior During Therapy: WFL for tasks assessed/performed Overall Cognitive Status: No family/caregiver present to determine baseline cognitive functioning Area of Impairment: Attention, Memory, Following commands, Safety/judgement, Awareness, Problem solving Current Attention Level: Sustained Memory: Decreased recall of precautions, Decreased short-term memory Following Commands: Follows one step commands with increased time Safety/Judgement: Decreased awareness of safety, Decreased awareness of deficits Awareness: Intellectual Problem Solving: Slow processing, Difficulty sequencing, Requires verbal cues, Requires tactile cues, Decreased initiation General Comments: alert; slow processing; slightly disoriented re: specific surgery she had and "2012" (although this is when she had 1st heart surgery)  Blood pressure 106/66, pulse 105, temperature 98.4 F (36.9 C), temperature source Oral, resp. rate 12, height 5\' 3"  (1.6 m), weight 80.9 kg (178 lb 5.6 oz), SpO2 97 %. Physical Exam  Nursing note and vitals reviewed. Constitutional: She is oriented to person, place, and time. She appears well-developed and well-nourished.  HENT:  Head: Normocephalic and atraumatic.  Eyes: Conjunctivae are normal. Pupils are equal, round, and reactive to light.  Neck: Normal range of motion. Neck supple.  Cardiovascular: An irregularly irregular rhythm present. Tachycardia present.   Respiratory: No accessory muscle usage. No respiratory distress. She has decreased breath sounds in the right lower field, the left middle field and the left lower field. She has no wheezes.  Weak cough due to pain  GI: She exhibits distension. Bowel sounds are decreased. There is no tenderness. There is no rebound.  Musculoskeletal: She exhibits edema.  Right hand cool to touch--+  pulses. Bilateral forearms with min edema and 2+ edema BLE. Chest/abdomen sore.  Neurological: She is alert and oriented to person, place, and time.  MMT: 3/5 delt,bicep,tricep,hi. LE: 3/5 hf,ke and adf/apf  Skin: Skin is warm and dry. No rash noted. No erythema.  Psychiatric: She has a normal mood and affect. Her speech is normal and behavior is normal. Judgment and thought content normal.    Results for orders placed or performed during the hospital encounter of 09/20/14 (from the past 24 hour(s))  Glucose, capillary     Status:  Abnormal   Collection Time: 09/28/14  8:13 PM  Result Value Ref Range   Glucose-Capillary 135 (H) 65 - 99 mg/dL   Comment 1 Notify RN    Comment 2 Document in Chart   Glucose, capillary     Status: None   Collection Time: 09/28/14 11:48 PM  Result Value Ref Range   Glucose-Capillary 95 65 - 99 mg/dL   Comment 1 Notify RN    Comment 2 Document in Chart   Glucose, capillary     Status: Abnormal   Collection Time: 09/29/14  4:03 AM  Result Value Ref Range   Glucose-Capillary 106 (H) 65 - 99 mg/dL   Comment 1 Notify RN    Comment 2 Document in Chart   CBC     Status: Abnormal   Collection Time: 09/29/14  4:14 AM  Result Value Ref Range   WBC 8.3 4.0 - 10.5 K/uL   RBC 3.43 (L) 3.87 - 5.11 MIL/uL   Hemoglobin 9.9 (L) 12.0 - 15.0 g/dL   HCT 16.1 (L) 09.6 - 04.5 %   MCV 91.8 78.0 - 100.0 fL   MCH 28.9 26.0 - 34.0 pg   MCHC 31.4 30.0 - 36.0 g/dL   RDW 40.9 (H) 81.1 - 91.4 %   Platelets 244 150 - 400 K/uL  Comprehensive metabolic panel     Status: Abnormal   Collection Time: 09/29/14  4:14 AM  Result Value Ref Range   Sodium 130 (L) 135 - 145 mmol/L   Potassium 3.5 3.5 - 5.1 mmol/L   Chloride 87 (L) 101 - 111 mmol/L   CO2 36 (H) 22 - 32 mmol/L   Glucose, Bld 105 (H) 65 - 99 mg/dL   BUN 14 6 - 20 mg/dL   Creatinine, Ser 7.82 (H) 0.44 - 1.00 mg/dL   Calcium 8.4 (L) 8.9 - 10.3 mg/dL   Total Protein 5.8 (L) 6.5 - 8.1 g/dL   Albumin 2.6 (L) 3.5 - 5.0 g/dL    AST 34 15 - 41 U/L   ALT 35 14 - 54 U/L   Alkaline Phosphatase 107 38 - 126 U/L   Total Bilirubin 0.8 0.3 - 1.2 mg/dL   GFR calc non Af Amer 39 (L) >60 mL/min   GFR calc Af Amer 46 (L) >60 mL/min   Anion gap 7 5 - 15  Magnesium     Status: None   Collection Time: 09/29/14  4:14 AM  Result Value Ref Range   Magnesium 2.1 1.7 - 2.4 mg/dL  Carboxyhemoglobin     Status: Abnormal   Collection Time: 09/29/14  7:35 AM  Result Value Ref Range   Total hemoglobin 10.6 (L) 12.0 - 16.0 g/dL   O2 Saturation 95.6 %   Carboxyhemoglobin 1.6 (H) 0.5 - 1.5 %   Methemoglobin 0.9 0.0 - 1.5 %  Glucose, capillary     Status: Abnormal   Collection Time: 09/29/14  8:06 AM  Result Value Ref Range   Glucose-Capillary 115 (H) 65 - 99 mg/dL  Glucose, capillary     Status: Abnormal   Collection Time: 09/29/14 11:54 AM  Result Value Ref Range   Glucose-Capillary 106 (H) 65 - 99 mg/dL   Dg Chest Port 1 View  09/29/2014   CLINICAL DATA:  74 year old female status post repair of ascending aortic pseudo aneurysm  EXAM: PORTABLE CHEST - 1 VIEW  COMPARISON:  Prior chest x-ray 09/28/2014  FINDINGS: Stable cardiac and mediastinal contours. Atherosclerotic calcifications in the transverse aorta. Patient is  status post median sternotomy. Evidence of prior aortic root and valve replacement. Right IJ central venous catheter in unchanged position with the tip in the distal SVC. Small bilateral pleural effusions, slightly enlarged on the right. Persistent linear atelectasis in the right mid lung. No overt pulmonary edema or new airspace consolidation. No pneumothorax.  IMPRESSION: 1. Slightly lower inspiratory volumes with persistent small bilateral pleural effusions and bibasilar atelectasis. 2. Persistent linear atelectasis in the right mid lung. 3. No pneumothorax or pulmonary edema.   Electronically Signed   By: Malachy Moan M.D.   On: 09/29/2014 07:52   Dg Chest Port 1 View  09/28/2014   CLINICAL DATA:  Status post  cardiac surgery.  EXAM: PORTABLE CHEST - 1 VIEW  COMPARISON:  09/27/2014  FINDINGS: The right IJ catheter is stable. The cardiac silhouette, mediastinal and hilar contours are unchanged. The lungs demonstrate improved aeration with resolving edema and atelectasis. There is a persistent small left effusion.  IMPRESSION: Slight improved lung aeration with resolving edema and atelectasis.   Electronically Signed   By: Rudie Meyer M.D.   On: 09/28/2014 09:09   Dg Abd Portable 1v  09/28/2014   CLINICAL DATA:  74 year old female with a history of redo sternotomy, ascending aortic aneurysm repair 09/23/2014.  EXAM: PORTABLE ABDOMEN - 1 VIEW  COMPARISON:  No recent abdominal plain film.  Prior CT 11/27/2007  FINDINGS: Gas within the right colon transverse colon, and sigmoid colon. Paucity of gas within the distal descending colon. No rectal gas.  Surgical changes of cholecystectomy, as well as abdominal hernia repair.  Epicardial pacing leads project over the left upper quadrant. Base of the chest not visualized.  No displaced fracture.  Pelvic thermister in place.  IMPRESSION: Single view demonstrates partial distention of the colon, with no evidence of obstruction. Changes may reflect colonic ileus, given the patient's history. If there is concern for acute abdominal process, CT would be recommended.  Surgical changes of the abdomen, as above.  Signed,  Yvone Neu. Loreta Ave, DO  [facility]  [facility]   Electronically Signed   By: Gilmer Mor D.O.   On: 09/28/2014 15:40  3  Assessment/Plan: Diagnosis: debility after aortic pseudoaneurysm/repair 1. Does the need for close, 24 hr/day medical supervision in concert with the patient's rehab needs make it unreasonable for this patient to be served in a less intensive setting? Yes 2. Co-Morbidities requiring supervision/potential complications: afib, s/p vent 3. Due to bladder management, bowel management, safety,  skin/wound care, disease management, medication administration, pain management and patient education, does the patient require 24 hr/day rehab nursing? Yes 4. Does the patient require coordinated care of a physician, rehab nurse, PT (1-2 hrs/day, 5 days/week) and OT (1-2 hrs/day, 5 days/week) to address physical and functional deficits in the context of the above medical diagnosis(es)? Yes and Potentially Addressing deficits in the following areas: balance, endurance, locomotion, strength, transferring, bowel/bladder control, bathing, dressing, feeding, grooming, toileting, psychosocial support and pain mgt 5. Can the patient actively participate in an intensive therapy program of at least 3 hrs of therapy per day at least 5 days per week? Potentially 6. The potential for patient to make measurable gains while on inpatient rehab is good 7. Anticipated functional outcomes upon discharge from inpatient rehab are modified independent  with PT, modified independent with OT, n/a with SLP. 8. Estimated rehab length of stay to reach the above functional goals is: 7-10 days 9. Does the patient have adequate social supports  and living environment to accommodate these discharge functional goals? Yes 10. Anticipated D/C setting: Home 11. Anticipated post D/C treatments: HH therapy 12. Overall Rehab/Functional Prognosis: excellent  RECOMMENDATIONS: This patient's condition is appropriate for continued rehabilitative care in the following setting: CIR Patient has agreed to participate in recommended program. Potentially Note that insurance prior authorization may be required for reimbursement for recommended care.  Comment: Rehab Admissions Coordinator to follow up.  Thanks,  Ranelle Oyster, MD, [place] [facility]     09/29/2014

## 2014-09-29 NOTE — Progress Notes (Signed)
Physical Therapy Treatment Patient Details Name: ANUHEA GASSNER MRN: [id number] DOB: [birth year] Today's Date: 09/29/2014    History of Present Illness Adm with chest pain 9/10. found to have 7 cm pseudoaneurysm of ascending aorta; 9/13 re-do sternotomy for aorta repair; extubated 9/14 PMHx- bil shoulder surgery, C5-6 fusion, AVR and MVR 2012    PT Comments    Patient with some confusion and requires repeated cues for safe mobility (and to maintain sternal precautions). Patient has supportive family, however per RN, her daughter has concerns re: ability to care for patient unless she is moving much better. Continues with decr balance.    Follow Up Recommendations  CIR     Equipment Recommendations  None recommended by PT    Recommendations for Other Services OT consult     Precautions / Restrictions Precautions Precautions: Sternal;Fall Restrictions Weight Bearing Restrictions: No (sternal precautions)    Mobility  Bed Mobility Overal bed mobility: Needs Assistance Bed Mobility: Rolling;Sidelying to Sit Rolling: Mod assist Sidelying to sit: Mod assist       General bed mobility comments: incr time and cues; vc to maintain sternal precautions; assist to scoot to EOB  Transfers Overall transfer level: Needs assistance Equipment used: 1 person hand held assist Transfers: Sit to/from Stand Sit to Stand: From elevated surface;Min assist Stand pivot transfers: Min assist;+2 safety/equipment       General transfer comment: pt anxious with max encouragement; sit to stand x 2 needing just as many cues for 2nd attempt (for setup, sternal precautions)  Ambulation/Gait             General Gait Details: unable with 1 person assist due to lines (RN not available)   Stairs            Wheelchair Mobility    Modified Rankin (Stroke Patients Only)       Balance     Sitting balance-Leahy Scale: Fair     Standing balance support: No upper extremity  supported Standing balance-Leahy Scale: Poor                      Cognition Arousal/Alertness: Awake/alert Behavior During Therapy: WFL for tasks assessed/performed Overall Cognitive Status: No family/caregiver present to determine baseline cognitive functioning Area of Impairment: Attention;Memory;Following commands;Safety/judgement;Awareness;Problem solving   Current Attention Level: Sustained Memory: Decreased recall of precautions;Decreased short-term memory Following Commands: Follows one step commands with increased time Safety/Judgement: Decreased awareness of safety;Decreased awareness of deficits Awareness: Intellectual Problem Solving: Slow processing;Difficulty sequencing;Requires verbal cues;Requires tactile cues;Decreased initiation General Comments: alert; slow processing; slightly disoriented re: specific surgery she had and "2012" (although this is when she had 1st heart surgery)    Exercises General Exercises - Lower Extremity Ankle Circles/Pumps: AROM;Both;10 reps Heel Slides: AROM;Strengthening;Both;5 reps;Supine (resisted extension) Hip Flexion/Marching: AAROM;Both;10 reps;Standing    General Comments        Pertinent Vitals/Pain Pain Assessment: Faces Faces Pain Scale: Hurts little more Pain Location: chest Pain Descriptors / Indicators: Discomfort Pain Intervention(s): Limited activity within patient's tolerance;Monitored during session;Repositioned    Home Living                      Prior Function            PT Goals (current goals can now be found in the care plan section) Acute Rehab PT Goals Patient Stated Goal: be able to live alone again Time For Goal Achievement: 10/03/14 Progress towards PT goals: Progressing toward goals  Frequency  Min 3X/week    PT Plan Discharge plan needs to be updated    Co-evaluation             End of Session Equipment Utilized During Treatment: Gait belt;Oxygen Activity Tolerance:  Patient tolerated treatment well Patient left: in chair;with call bell/phone within reach     Time: 1252-1329 PT Time Calculation (min) (ACUTE ONLY): 37 min  Charges:  $Therapeutic Activity: 23-37 mins                    G Codes:      SASSER,LYNN 09-30-2014, 1:45 PM  Pager [phone]

## 2014-09-29 NOTE — Care Management Important Message (Signed)
Important Message  Patient Details  Name: Ashley [name] MRN: [id number] Date of Birth: 02-10-1940   Medicare Important Message Given:  Yes-third notification given    Kyla Balzarine 09/29/2014, 2:30 PM

## 2014-09-29 NOTE — Progress Notes (Signed)
[facility] BRIEF SICU PROGRESS NOTE  6 Days Post-Op  S/P Procedure(s) (LRB): REDO STERNOTOMY (N/A) REPAIR OF ASCENDING AORTIC PSEUDOANEURYSM (N/A) TRANSESOPHAGEAL ECHOCARDIOGRAM (TEE) (N/A)   Stable day Rate-controlled Afib O2 sats 94-97% Diuresing well  Plan: Continue current plan  Purcell Nails 09/29/2014 6:21 PM

## 2014-09-29 NOTE — Progress Notes (Signed)
Patient ID: Ashley [name], female   DOB: 1940/06/23, 74 y.o.   MRN: [id number]  Advanced Heart Failure Rounding Note  Primary Physician: Dr Karsten Fells Primary Cardiologist: Dr Armanda Magic previously. Primary HF: Bensimhon  Subjective:    Successfully extubated 09/24/14 after weaning off NO. Swan d/c 09/25/14  On milrinone 0.375 and Lasix 15 mg/hr, got 5 mg metolazone daily over weekend.   She continues to diurese well on current regimen.  Out 1.5 L and down 7 lbs.  Not as nauseated this am.  Had a BM that seems to have helped. CVP 13. HR remains elevated 110s in atrial fibrillation.    Objective:   Weight Range: 178 lb 5.6 oz (80.9 kg) Body mass index is 31.6 kg/(m^2).   Vital Signs:   Temp:  [97.3 F (36.3 C)-98.1 F (36.7 C)] 97.3 F (36.3 C) (09/19 0406) Pulse Rate:  [98-137] 115 (09/19 0700) Resp:  [0-23] 16 (09/19 0700) BP: (75-151)/(46-94) 105/69 mmHg (09/19 0700) SpO2:  [90 %-100 %] 97 % (09/19 0700) Weight:  [178 lb 5.6 oz (80.9 kg)] 178 lb 5.6 oz (80.9 kg) (09/19 0600) Last BM Date: 09/27/14  Weight change: Filed Weights   09/27/14 0500 09/28/14 0700 09/29/14 0600  Weight: 198 lb 6.6 oz (90 kg) 185 lb 6.5 oz (84.1 kg) 178 lb 5.6 oz (80.9 kg)    Intake/Output:   Intake/Output Summary (Last 24 hours) at 09/29/14 0706 Last data filed at 09/29/14 0700  Gross per 24 hour  Intake 2783.1 ml  Output   4357 ml  Net -1573.9 ml     Physical Exam: General: NAD, seated in chair. HEENT: Normal Neck: supple. RIJ swan. JVP 11-12 cm. Carotids 2+ bilat; no bruits. No lymphadenopathy or thyromegaly . R Cordis in place. Cor: PMI nondisplaced. Tachy. Irregularly irregular. 2/6 TR Lungs: CTA  Abdomen: soft, nontender, nondistended. No HSM. No bruits or masses. Good bowel sounds. Extremities: no cyanosis, clubbing, rash. 1+ ankle edema. SCDs in place. GU: Foley Neuro: Moves all 4 extremities without difficulty. No focal deficits. Affect flat but  appropriate   Telemetry: Afib RVR 100s-110s  Labs: CBC  Recent Labs  09/28/14 0455 09/29/14 0414  WBC 10.0 8.3  HGB 9.9* 9.9*  HCT 30.2* 31.5*  MCV 92.1 91.8  PLT 196 244   Basic Metabolic Panel  Recent Labs  09/28/14 0455 09/29/14 0414  NA 133* 130*  K 3.4* 3.5  CL 92* 87*  CO2 33* 36*  GLUCOSE 121* 105*  BUN 11 14  CALCIUM 8.5* 8.4*  MG 1.3* 2.1   Liver Function Tests  Recent Labs  09/28/14 0455 09/29/14 0414  AST 37 34  ALT 41 35  ALKPHOS 113 107  BILITOT 1.0 0.8  PROT 5.9* 5.8*  ALBUMIN 2.7* 2.6*   No results for input(s): LIPASE, AMYLASE in the last 72 hours. Cardiac Enzymes No results for input(s): CKTOTAL, CKMB, CKMBINDEX, TROPONINI in the last 72 hours.  BNP: BNP (last 3 results) No results for input(s): BNP in the last 8760 hours.  ProBNP (last 3 results) No results for input(s): PROBNP in the last 8760 hours.   D-Dimer No results for input(s): DDIMER in the last 72 hours. Hemoglobin A1C No results for input(s): HGBA1C in the last 72 hours. Fasting Lipid Panel No results for input(s): CHOL, HDL, LDLCALC, TRIG, CHOLHDL, LDLDIRECT in the last 72 hours. Thyroid Function Tests  Recent Labs  09/28/14 0455  TSH 5.695*    Other results:     Imaging/Studies:  Dg Chest Port 1 View  09/28/2014   CLINICAL DATA:  Status post cardiac surgery.  EXAM: PORTABLE CHEST - 1 VIEW  COMPARISON:  09/27/2014  FINDINGS: The right IJ catheter is stable. The cardiac silhouette, mediastinal and hilar contours are unchanged. The lungs demonstrate improved aeration with resolving edema and atelectasis. There is a persistent small left effusion.  IMPRESSION: Slight improved lung aeration with resolving edema and atelectasis.   Electronically Signed   By: Rudie Meyer M.D.   On: 09/28/2014 09:09   Dg Abd Portable 1v  09/28/2014   CLINICAL DATA:  74 year old female with a history of redo sternotomy, ascending aortic aneurysm repair 09/23/2014.  EXAM:  PORTABLE ABDOMEN - 1 VIEW  COMPARISON:  No recent abdominal plain film.  Prior CT 11/27/2007  FINDINGS: Gas within the right colon transverse colon, and sigmoid colon. Paucity of gas within the distal descending colon. No rectal gas.  Surgical changes of cholecystectomy, as well as abdominal hernia repair.  Epicardial pacing leads project over the left upper quadrant. Base of the chest not visualized.  No displaced fracture.  Pelvic thermister in place.  IMPRESSION: Single view demonstrates partial distention of the colon, with no evidence of obstruction. Changes may reflect colonic ileus, given the patient's history. If there is concern for acute abdominal process, CT would be recommended.  Surgical changes of the abdomen, as above.  Signed,  Yvone Neu. Loreta Ave, DO  [facility]  [facility]   Electronically Signed   By: Gilmer Mor D.O.   On: 09/28/2014 15:40    Latest Echo  Latest Cath   Medications:     Scheduled Medications: . antiseptic oral rinse  7 mL Mouth Rinse BID  . antiseptic oral rinse  7 mL Mouth Rinse QID  . aspirin EC  81 mg Oral Daily  . bisacodyl  10 mg Oral Daily   Or  . bisacodyl  10 mg Rectal Daily  . chlorpheniramine-HYDROcodone  5 mL Oral Q12H  . digoxin  0.125 mg Intravenous Daily  . docusate sodium  200 mg Oral Daily  . Influenza vac split quadrivalent PF  0.5 mL Intramuscular Tomorrow-1000  . insulin aspart  0-24 Units Subcutaneous 6 times per day  . metoCLOPramide (REGLAN) injection  10 mg Intravenous 4 times per day  . metoprolol succinate  25 mg Oral BID  . pantoprazole  40 mg Oral Daily  . potassium chloride  10 mEq Intravenous Q1 Hr x 3  . sildenafil  60 mg Oral TID  . sodium chloride  3 mL Intravenous Q12H  . spironolactone  12.5 mg Oral Daily    Infusions: . sodium chloride 20 mL/hr at 09/25/14 1000  . sodium chloride    . sodium chloride 20 mL/hr at 09/29/14 0700  . amiodarone 30 mg/hr (09/29/14 0700)   . furosemide (LASIX) infusion 15 mg/hr (09/29/14 0700)  . lactated ringers Stopped (09/23/14 2300)  . milrinone 0.375 mcg/kg/min (09/29/14 0700)  . norepinephrine (LEVOPHED) Adult infusion Stopped (09/25/14 0615)    PRN Medications: sodium chloride, Gerhardt's butt cream, metoprolol, midazolam, morphine injection, ondansetron (ZOFRAN) IV, oxyCODONE, promethazine, sodium chloride, traMADol   Assessment/Plan   1. 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 H/o AVR (bioprosthesis) and MV ring 2012 3. 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  4. AKI 5. Persistent atrial fibrillation with  RVR.  6. Acute respiratory failure: Resolved.  7. Pulmonary arterial hypertension.   She remains on milrinone for RV support.   Volume status improving but remains elevated with RV failure. CVP 13. Cr up slightly 1.2 -> 1.3.  Continue lasix gtt at 15.  Will give one more day of 5 mg metolazone.  Goal weight unclear but down 19 lbs from highest weight this admission.  Coox pending. Continue amiodarone gtt + digoxin + Toprol XL for rate control. Toprol increased to 25 BID 9/18. Goal HR currently < 125 with diuresis. Avoiding anticoagulation due to aortic hematoma on admit. Continue SCDs. On Revatio for pulmonary hypertension.  Replete K and Mg for goal K > 4.0 and Mg >2.0.  K 3.5 this am.  Run of K ordered. Also on spironolactone 12.5.  Length of Stay: [address] [place], [place] 7:06 AM 09/29/2014  Advanced Heart Failure Team Pager [phone] (M-F; 7a - 4p)  Please contact [facility] for night-coverage after hours (4p -7a ) and weekends on [email or web address]  Patient seen with PA, agree with the above note.  CVP 13 today with co-ox 66%. She continues to diurese well, still has excessive volume.  - Continue Lasix gtt +  metolazone today.  - Can decrease milrinone to 0.25.   HR 100s-120 in atrial fibrillation.  Hopefully rate will come down as we wean down on milrinone.   Marca Ancona 09/29/2014 9:39 AM

## 2014-09-29 NOTE — Progress Notes (Signed)
Nutrition Follow-up  DOCUMENTATION CODES:   Obesity unspecified  INTERVENTION:   No nutrition intervention at this time --- patient declined  NEW NUTRITION DIAGNOSIS:   Increased nutrient needs related to  (post op healing) as evidenced by estimated needs, ongoing  GOAL:   Patient will meet greater than or equal to 90% of their needs, progressing  MONITOR:   PO intake, Labs, Weight trends, I & O's   ASSESSMENT:   74 year old woman with hypertension, dyslipidemia, PAF on coumadin and valvular heart disease who underwent mitral valve repair, MAZE procedure, and AVR with a 19 mm pericardial valve in 04/2010. she developed upper substernal chest pain and cough. This has worsened and she was admitted to [facility] in [place] last Thursday. She underwent a cardiac cath there that reportedly showed no coronary disease. 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    Pt s/p procedures 9/13: REDO STERNOTOMY REPAIR OF ASCENDING AORTIC PSEUDOANEURYSM  TRANSESOPHAGEAL ECHOCARDIOGRAM (TEE)   Pt extubated 9/14.  States she ate well this AM at breakfast.  PO intake poor at 10% per flowsheet records.  Nutrient needs increased given post-op state.  RD offered oral nutrition supplements, however, pt declined.  Diet Order:  Diet full liquid Room service appropriate?: Yes; Fluid consistency:: Thin  Skin:  Reviewed, no issues  Last BM:  9/17  Height:   Ht Readings from Last 1 Encounters:  09/20/14  (1.6 m)    Weight:   Wt Readings from Last 1 Encounters:  09/29/14 178 lb 5.6 oz (80.9 kg)    Ideal Body Weight:  52.27 kg  BMI:  Body mass index is 31.6 kg/(m^2).  Estimated Nutritional Needs:   Kcal:  1850-2050  Protein:  105-115 gm  Fluid:  per MD  EDUCATION NEEDS:   No education needs  identified at this time  Maureen Chatters, RD, LDN Pager #: [phone] After-Hours Pager #: [phone]

## 2014-09-29 NOTE — Progress Notes (Signed)
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V.:1503.1; IV Piggyback:530] Out: 4357 [Urine:4355; Stool:2] Intake/Output this shift: Total I/O In: 81.7 [P.O.:60; I.V.:21.7] Out: 200 [Urine:200]  Serous drainage from the lower aspect of sternal incision Abdomen nontender Mild-moderate pedal edema No murmur  Lab Results:  Recent Labs  09/28/14 0455 09/29/14 0414 09/29/14 1822  WBC 10.0 8.3  --   HGB 9.9* 9.9* 12.2  HCT 30.2* 31.5* 36.0  PLT 196 244  --    BMET:  Recent Labs  09/28/14 0455 09/29/14 0414 09/29/14 1822  NA 133* 130* 129*  K 3.4* 3.5 3.9  CL 92* 87* 84*  CO2 33* 36*  --   GLUCOSE 121* 105* 119*  BUN CREATININE 1.20* 1.30* 1.40*  CALCIUM 8.5* 8.4*  --     PT/INR: No results for input(s): LABPROT, INR in the last 72 hours. ABG    Component Value Date/Time   PHART 7.368 09/24/2014 1853   HCO3 20.9 09/24/2014 1853   TCO2 34 09/29/2014 1822   ACIDBASEDEF 4.0* 09/24/2014 1853   O2SAT 65.8 09/29/2014 0735   CBG (last 3)   Recent Labs  09/29/14 0806 09/29/14 1154 09/29/14 1702  GLUCAP 115* 106* 131*    Assessment/Plan: S/P Procedure(s) (LRB): REDO STERNOTOMY (N/A) REPAIR OF ASCENDING AORTIC PSEUDOANEURYSM (N/A) TRANSESOPHAGEAL ECHOCARDIOGRAM (TEE) (N/A) Start oral Keflex well serous drainage continues more sternum Continue with physical therapy, nutritional support and medical management of her pulmonary hypertension and right heart failure.   LOS: 9 days    Ashley [name] 09/29/2014

## 2014-09-30 ENCOUNTER — Inpatient Hospital Stay (HOSPITAL_COMMUNITY): Payer: Medicare Other

## 2014-09-30 LAB — URINE CULTURE

## 2014-09-30 LAB — GLUCOSE, CAPILLARY
GLUCOSE-CAPILLARY: 102 mg/dL — AB (ref 65–99)
GLUCOSE-CAPILLARY: 102 mg/dL — AB (ref 65–99)
GLUCOSE-CAPILLARY: 126 mg/dL — AB (ref 65–99)
Glucose-Capillary: 125 mg/dL — ABNORMAL HIGH (ref 65–99)
Glucose-Capillary: 122 mg/dL — ABNORMAL HIGH (ref 65–99)
Glucose-Capillary: 112 mg/dL — ABNORMAL HIGH (ref 65–99)

## 2014-09-30 LAB — CBC
HCT: 31.3 % — ABNORMAL LOW (ref 36.0–46.0)
Hemoglobin: 10 g/dL — ABNORMAL LOW (ref 12.0–15.0)
MCH: 29.7 pg (ref 26.0–34.0)
MCHC: 31.9 g/dL (ref 30.0–36.0)
MCV: 92.9 fL (ref 78.0–100.0)
Platelets: 296 10*3/uL (ref 150–400)
RBC: 3.37 MIL/uL — ABNORMAL LOW (ref 3.87–5.11)
RDW: 15.8 % — ABNORMAL HIGH (ref 11.5–15.5)
WBC: 7.8 10*3/uL (ref 4.0–10.5)

## 2014-09-30 LAB — CARBOXYHEMOGLOBIN
Carboxyhemoglobin: 1.5 % (ref 0.5–1.5)
Methemoglobin: 0.9 % (ref 0.0–1.5)
O2 Saturation: 69 %
Total hemoglobin: 10 g/dL — ABNORMAL LOW (ref 12.0–16.0)

## 2014-09-30 LAB — BASIC METABOLIC PANEL
Anion gap: 8 (ref 5–15)
BUN: 16 mg/dL (ref 6–20)
CO2: 39 mmol/L — ABNORMAL HIGH (ref 22–32)
Calcium: 8.5 mg/dL — ABNORMAL LOW (ref 8.9–10.3)
Chloride: 83 mmol/L — ABNORMAL LOW (ref 101–111)
Creatinine, Ser: 1.37 mg/dL — ABNORMAL HIGH (ref 0.44–1.00)
GFR calc Af Amer: 43 mL/min — ABNORMAL LOW (ref >60)
GFR calc non Af Amer: 37 mL/min — ABNORMAL LOW (ref >60)
Glucose, Bld: 105 mg/dL — ABNORMAL HIGH (ref 65–99)
Potassium: 3.7 mmol/L (ref 3.5–5.1)
Sodium: 130 mmol/L — ABNORMAL LOW (ref 135–145)

## 2014-09-30 MED ORDER — MILRINONE IN DEXTROSE 20 MG/100ML IV SOLN
0.1250 ug/kg/min | INTRAVENOUS | Status: DC
  Administered 2014-09-30: 0.125 ug/kg/min via INTRAVENOUS

## 2014-09-30 MED ORDER — METOPROLOL SUCCINATE ER 25 MG PO TB24
37.5000 mg | ORAL_TABLET | Freq: Two times a day (BID) | ORAL | Status: DC
  Administered 2014-09-30 – 2014-10-04 (×8): 37.5 mg via ORAL
  Filled 2014-09-30 (×8): qty 2

## 2014-09-30 MED ORDER — AMIODARONE HCL 200 MG PO TABS: 200.0000 mg | ORAL_TABLET | Freq: Two times a day (BID) | ORAL | Status: DC

## 2014-09-30 MED ORDER — POTASSIUM CHLORIDE 10 MEQ/50ML IV SOLN
10.0000 meq | INTRAVENOUS | Status: AC | PRN
  Administered 2014-09-30 (×3): 10 meq via INTRAVENOUS

## 2014-09-30 MED ORDER — METOLAZONE 2.5 MG PO TABS
5.0000 mg | ORAL_TABLET | Freq: Once | ORAL | Status: AC
  Administered 2014-09-30: 5 mg via ORAL
  Filled 2014-09-30 (×2): qty 2

## 2014-09-30 NOTE — Care Management Note (Deleted)
Case Management Note  Patient Details  Name: Ashley [name] MRN: [id number] Date of Birth: 10-22-1940  Subjective/Objective:      Patient slow to progression.  Still not opening eyes much, but following commands.  R side seems to be affected by ? Neurological event.  Hope for CIR if bed available.  SNF for rehab as backup.  Family on board and updated daily.               Action/Plan:   Expected Discharge Date:                  Expected Discharge Plan:  IP Rehab Facility  In-House Referral:  Clinical Social Work  Discharge planning Services  CM Consult  Post Acute Care Choice:    Choice offered to:     DME Arranged:    DME Agency:     HH Arranged:    HH Agency:     Status of Service:  In process, will continue to follow  Medicare Important Message Given:  Yes-third notification given Date Medicare IM Given:    Medicare IM give by:    Date Additional Medicare IM Given:    Additional Medicare Important Message give by:     If discussed at Long Length of Stay Meetings, dates discussed:    Additional Comments:  Vangie Bicker, RN 09/30/2014, 12:14 PM

## 2014-09-30 NOTE — Progress Notes (Signed)
Rehab admissions - Evaluated for possible admission.  Patient was in the process of transferring earlier to 2W21.  I will follow up in am for potential rehab options.  Currently rehab beds are full today.  Call me for questions.  #[phone]

## 2014-09-30 NOTE — Progress Notes (Signed)
Physical Therapy Treatment Patient Details Name: Ashley [name] MRN: [id number] DOB: 04/13/1940 Today's Date: 09/30/2014    History of Present Illness Adm with chest pain 9/10. found to have 7 cm pseudoaneurysm of ascending aorta; 9/13 re-do sternotomy for aorta repair; extubated 9/14 PMHx- bil shoulder surgery, C5-6 fusion, AVR and MVR 2012    PT Comments    Overall improvement today. Unable to practice bed mobility this date and anticipate this will continue to require incr assist and cuing to maintain sternal precautions. Pt continues to require max cues to adhere to sternal precautions with all other mobility.    Follow Up Recommendations  CIR     Equipment Recommendations  None recommended by PT    Recommendations for Other Services OT consult     Precautions / Restrictions Precautions Precautions: Sternal;Fall Precaution Comments: Patient unable to recall precautions. The patient needs reinforcement to maintain precautions with functional mobility.   Restrictions Weight Bearing Restrictions: No (sternal precautions)    Mobility  Bed Mobility                  Transfers Overall transfer level: Needs assistance Equipment used: Pushed w/c Transfers: Sit to/from Stand Sit to Stand: From elevated surface;Min assist         General transfer comment: some anxiety and required vc for sequencing/set up; assist for anteior wt shift  Ambulation/Gait Ambulation/Gait assistance: Min assist Ambulation Distance (Feet): 400 Feet Assistive device:  (push w/c) Gait Pattern/deviations: Step-through pattern;Decreased stride length;Drifts right/left     General Gait Details: still in ICU, but fewer lines and able to walk +1 assist; encouraged to limit velocity due to increased HR (122-133)   Stairs            Wheelchair Mobility    Modified Rankin (Stroke Patients Only)       Balance Overall balance assessment: Needs assistance Sitting-balance support:  No upper extremity supported;Feet supported Sitting balance-Leahy Scale: Fair     Standing balance support: No upper extremity supported Standing balance-Leahy Scale: Poor Standing balance comment: slight posterior lean initially                    Cognition Arousal/Alertness: Awake/alert Behavior During Therapy: WFL for tasks assessed/performed Overall Cognitive Status: No family/caregiver present to determine baseline cognitive functioning Area of Impairment: Awareness;Problem solving           Awareness: Intellectual Problem Solving: Slow processing;Requires verbal cues;Difficulty sequencing;Requires tactile cues      Exercises      General Comments        Pertinent Vitals/Pain Pain Assessment: No/denies pain    Home Living                      Prior Function            PT Goals (current goals can now be found in the care plan section) Acute Rehab PT Goals Patient Stated Goal: be able to live alone again Time For Goal Achievement: 10/03/14 Progress towards PT goals: Progressing toward goals    Frequency  Min 3X/week    PT Plan Discharge plan needs to be updated    Co-evaluation             End of Session Equipment Utilized During Treatment: Gait belt;Oxygen Activity Tolerance: Patient tolerated treatment well Patient left: in chair;with call bell/phone within reach;with family/visitor present     Time: 1150-1230 PT Time Calculation (min) (ACUTE ONLY): 40 min  Charges:  $Gait Training: 38-52 mins                    G Codes:      SASSER,LYNN October 11, 2014, 1:17 PM Pager [phone]

## 2014-09-30 NOTE — Progress Notes (Signed)
7 Days Post-Op Procedure(s) (LRB): REDO STERNOTOMY (N/A) REPAIR OF ASCENDING AORTIC PSEUDOANEURYSM (N/A) TRANSESOPHAGEAL ECHOCARDIOGRAM (TEE) (N/A) Subjective: Doing well with diuresis, revatio and milrinone for RV dysfunction, pul HTN controlled afib- stable cxr clear Incision dry this am   Objective: Vital signs in last 24 hours: Temp:  [97.9 F (36.6 C)-98.4 F (36.9 C)] 98.1 F (36.7 C) (09/20 0400) Pulse Rate:  [56-131] 123 (09/20 0700) Cardiac Rhythm:  [-] Atrial fibrillation (09/20 0400) Resp:  [10-32] 15 (09/20 0700) BP: (89-126)/(51-85) 95/70 mmHg (09/20 0700) SpO2:  [92 %-100 %] 98 % (09/20 0700) Weight:  [172 lb 6.4 oz (78.2 kg)] 172 lb 6.4 oz (78.2 kg) (09/20 0500)  Hemodynamic parameters for last 24 hours: CVP:  [9 mmHg-16 mmHg] 16 mmHg  Intake/Output from previous day: 09/19 0701 - 09/20 0700 In: 1467.4 [P.O.:680; I.V.:587.4; IV Piggyback:200] Out: 3935 [Urine:3935] Intake/Output this shift:    Breathing easily extrem warm TR murmur  Lab Results:  Recent Labs  09/29/14 0414 09/29/14 1822 09/30/14 0400  WBC 8.3  --  7.8  HGB 9.9* 12.2 10.0*  HCT 31.5* 36.0 31.3*  PLT 244  --  296   BMET:  Recent Labs  09/29/14 0414 09/29/14 1822 09/30/14 0400  NA 130* 129* 130*  K 3.5 3.9 3.7  CL 87* 84* 83*  CO2 36*  --  39*  GLUCOSE 105* 119* 105*  BUN CREATININE 1.30* 1.40* 1.37*  CALCIUM 8.4*  --  8.5*    PT/INR: No results for input(s): LABPROT, INR in the last 72 hours. ABG    Component Value Date/Time   PHART 7.368 09/24/2014 1853   HCO3 20.9 09/24/2014 1853   TCO2 34 09/29/2014 1822   ACIDBASEDEF 4.0* 09/24/2014 1853   O2SAT 69.0 09/30/2014 0425   CBG (last 3)   Recent Labs  09/29/14 1702 09/29/14 1956 09/30/14 0423  GLUCAP 131* 111* 102*    Assessment/Plan: S/P Procedure(s) (LRB): REDO STERNOTOMY (N/A) REPAIR OF ASCENDING AORTIC PSEUDOANEURYSM (N/A) TRANSESOPHAGEAL ECHOCARDIOGRAM (TEE)  (N/A) Mobilize Diuresis Plan for transfer to step-down: see transfer orders Wean lasix drip per HF team Place PICC for longterm milrinone  LOS: 10 days    Ashley [name] 09/30/2014

## 2014-09-30 NOTE — Clinical Social Work Note (Signed)
CSW put updated FL2 on chart for patient, and faxed out updated clinicals to SNFs in [facility].  Handoff given to unit CSW, this CSW to sign off.  Ervin Knack. Anterhaus, MSW, Theresia Majors [phone] 09/30/2014 3:36 PM

## 2014-09-30 NOTE — Progress Notes (Signed)
Patient transferred to 2W21. Belongings, SCDs, lunch tray at bedside. RN to receive patient at bedside. RN made aware of central line DC following PICC placement. No questions.  Hermina Barters, RN

## 2014-09-30 NOTE — Progress Notes (Addendum)
Patient ID: Ashley [name], female   DOB: 29-Feb-1940, 74 y.o.   MRN: [id number]  Advanced Heart Failure Rounding Note  Primary Physician: Dr Karsten Fells Primary Cardiologist: Dr Armanda Magic previously. Primary HF: Bensimhon  Subjective:    Successfully extubated 09/24/14 after weaning off NO. Swan d/c 09/25/14  Continues to feel a lot better.  Passing a lot of gas.  No SOB. Still a little sore.  On milrinone 0.25 and Lasix 15 mg/hr, got 5 mg metolazone yesterday. Coox 69.0 % Cr stable. 1.3 -> 1.4 -> 1.73  She continues to diurese well on current regimen.  Out 2.4 L and down 6 lbs. CVP 13. HR remains elevated 120s in atrial fibrillation.    Objective:   Weight Range: 172 lb 6.4 oz (78.2 kg) Body mass index is 30.55 kg/(m^2).   Vital Signs:   Temp:  [97.9 F (36.6 C)-98.4 F (36.9 C)] 98.1 F (36.7 C) (09/20 0400) Pulse Rate:  [56-131] 123 (09/20 0700) Resp:  [10-32] 15 (09/20 0700) BP: (89-126)/(51-85) 95/70 mmHg (09/20 0700) SpO2:  [92 %-100 %] 98 % (09/20 0700) Weight:  [172 lb 6.4 oz (78.2 kg)] 172 lb 6.4 oz (78.2 kg) (09/20 0500) Last BM Date: 09/29/14  Weight change: Filed Weights   09/28/14 0700 09/29/14 0600 09/30/14 0500  Weight: 185 lb 6.5 oz (84.1 kg) 178 lb 5.6 oz (80.9 kg) 172 lb 6.4 oz (78.2 kg)    Intake/Output:   Intake/Output Summary (Last 24 hours) at 09/30/14 0705 Last data filed at 09/30/14 0500  Gross per 24 hour  Intake 1323.98 ml  Output   3810 ml  Net -2486.02 ml     Physical Exam: General: NAD, reclined in bed HEENT: Normal Neck: supple. JVP 10 cm. Carotids 2+ bilat; no bruits. No lymphadenopathy or thyromegaly. R Cordis in place. Cor: PMI nondisplaced. Tachy. Irregularly irregular. 2/6 TR Lungs: Clear, slightly diminished bases Abdomen: soft, NT, ND. No hepatosplenomegaly. No bruits or masses noted. +BS Extremities: no cyanosis, clubbing, rash. 1+ LE edema to mid calf. GU: Foley Neuro: Moves all 4 extremities without difficulty.  No focal deficits. Affect flat but appropriate   Telemetry: Afib RVR 100s-120s  Labs: CBC  Recent Labs  09/29/14 0414 09/29/14 1822 09/30/14 0400  WBC 8.3  --  7.8  HGB 9.9* 12.2 10.0*  HCT 31.5* 36.0 31.3*  MCV 91.8  --  92.9  PLT 244  --  296   Basic Metabolic Panel  Recent Labs  09/28/14 0455 09/29/14 0414 09/29/14 1822 09/30/14 0400  NA 133* 130* 129* 130*  K 3.4* 3.5 3.9 3.7  CL 92* 87* 84* 83*  CO2 33* 36*  --  39*  GLUCOSE 121* 105* 119* 105*  BUN CALCIUM 8.5* 8.4*  --  8.5*  MG 1.3* 2.1  --   --    Liver Function Tests  Recent Labs  09/28/14 0455 09/29/14 0414  AST 37 34  ALT 41 35  ALKPHOS 113 107  BILITOT 1.0 0.8  PROT 5.9* 5.8*  ALBUMIN 2.7* 2.6*   No results for input(s): LIPASE, AMYLASE in the last 72 hours. Cardiac Enzymes No results for input(s): CKTOTAL, CKMB, CKMBINDEX, TROPONINI in the last 72 hours.  BNP: BNP (last 3 results) No results for input(s): BNP in the last 8760 hours.  ProBNP (last 3 results) No results for input(s): PROBNP in the last 8760 hours.   D-Dimer No results for input(s): DDIMER in the last 72 hours. Hemoglobin A1C  No results for input(s): HGBA1C in the last 72 hours. Fasting Lipid Panel No results for input(s): CHOL, HDL, LDLCALC, TRIG, CHOLHDL, LDLDIRECT in the last 72 hours. Thyroid Function Tests  Recent Labs  09/28/14 0455  TSH 5.695*    Other results:     Imaging/Studies:  Dg Chest Port 1 View  09/29/2014   CLINICAL DATA:  74 year old female status post repair of ascending aortic pseudo aneurysm  EXAM: PORTABLE CHEST - 1 VIEW  COMPARISON:  Prior chest x-ray 09/28/2014  FINDINGS: Stable cardiac and mediastinal contours. Atherosclerotic calcifications in the transverse aorta. Patient is status post median sternotomy. Evidence of prior aortic root and valve replacement. Right IJ central venous catheter in unchanged position with the tip in the distal SVC. Small bilateral pleural  effusions, slightly enlarged on the right. Persistent linear atelectasis in the right mid lung. No overt pulmonary edema or new airspace consolidation. No pneumothorax.  IMPRESSION: 1. Slightly lower inspiratory volumes with persistent small bilateral pleural effusions and bibasilar atelectasis. 2. Persistent linear atelectasis in the right mid lung. 3. No pneumothorax or pulmonary edema.   Electronically Signed   By: Malachy Moan M.D.   On: 09/29/2014 07:52   Dg Abd Portable 1v  09/29/2014   CLINICAL DATA:  Duodenal ileus. Additional history of ascending aortic aneurysm repair on 09/23/2014.  EXAM: PORTABLE ABDOMEN - 1 VIEW  COMPARISON:  Abdominal plain film dated 09/28/2014.  FINDINGS: The partial distention of the colon has resolved in the interval. Large bowel now appears normal in caliber throughout. There are no dilated small bowel loops seen. There is no evidence of soft tissue mass or abnormal fluid collection. No free intraperitoneal air seen.  Again noted are the surgical changes of previous cholecystectomy and abdominal wall hernia repair. Small caliber pelvic catheter unchanged in position. Degenerative changes again noted within the lumbar spine, mild to moderate in degree. No acute osseous abnormality.  IMPRESSION: No evidence of acute intra-abdominal or intrapelvic abnormality seen. Bowel gas pattern is now nonobstructive. No evidence of bowel obstruction or ileus.   Electronically Signed   By: Bary Richard M.D.   On: 09/29/2014 16:06   Dg Abd Portable 1v  09/28/2014   CLINICAL DATA:  74 year old female with a history of redo sternotomy, ascending aortic aneurysm repair 09/23/2014.  EXAM: PORTABLE ABDOMEN - 1 VIEW  COMPARISON:  No recent abdominal plain film.  Prior CT 11/27/2007  FINDINGS: Gas within the right colon transverse colon, and sigmoid colon. Paucity of gas within the distal descending colon. No rectal gas.  Surgical changes of cholecystectomy, as well as abdominal hernia  repair.  Epicardial pacing leads project over the left upper quadrant. Base of the chest not visualized.  No displaced fracture.  Pelvic thermister in place.  IMPRESSION: Single view demonstrates partial distention of the colon, with no evidence of obstruction. Changes may reflect colonic ileus, given the patient's history. If there is concern for acute abdominal process, CT would be recommended.  Surgical changes of the abdomen, as above.  Signed,  Yvone Neu. Loreta Ave, DO  [facility]  [facility]   Electronically Signed   By: Gilmer Mor D.O.   On: 09/28/2014 15:40    Latest Echo  Latest Cath   Medications:     Scheduled Medications: . antiseptic oral rinse  7 mL Mouth Rinse BID  . aspirin EC  81 mg Oral Daily  . bisacodyl  10 mg Oral Daily   Or  . bisacodyl  10 mg Rectal Daily  . cephALEXin  500 mg Oral 3 times per day  . digoxin  0.125 mg Intravenous Daily  . docusate sodium  200 mg Oral Daily  . enoxaparin (LOVENOX) injection  30 mg Subcutaneous Q24H  . Influenza vac split quadrivalent PF  0.5 mL Intramuscular Tomorrow-1000  . insulin aspart  0-24 Units Subcutaneous 6 times per day  . metoCLOPramide (REGLAN) injection  10 mg Intravenous 4 times per day  . metoprolol succinate  25 mg Oral BID  . pantoprazole  40 mg Oral Daily  . sildenafil  60 mg Oral TID  . sodium chloride  3 mL Intravenous Q12H  . spironolactone  12.5 mg Oral Daily    Infusions: . sodium chloride 20 mL/hr at 09/25/14 1000  . sodium chloride    . sodium chloride 20 mL/hr at 09/29/14 1800  . furosemide (LASIX) infusion 15 mg/hr (09/30/14 0400)  . lactated ringers Stopped (09/23/14 2300)  . milrinone 0.25 mcg/kg/min (09/30/14 0400)  . norepinephrine (LEVOPHED) Adult infusion Stopped (09/25/14 0615)    PRN Medications: sodium chloride, Gerhardt's butt cream, metoprolol, midazolam, ondansetron (ZOFRAN) IV, oxyCODONE, potassium chloride, promethazine, sodium  chloride, traMADol   Assessment/Plan   1. 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 H/o AVR (bioprosthesis) and MV ring 2012 3. 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  4. AKI 5. Persistent atrial fibrillation with RVR.  6. Acute respiratory failure: Resolved.  7. Pulmonary arterial hypertension.   She remains on milrinone for RV support.   Volume status improving but remains elevated with RV failure. CVP remains 13 and 1+ LE edema. Cr up stable 1.3 -> 1.4 -> 1.37.  Continue lasix gtt at 15 for today. Consider one more day of metolazone 5 mg.   Goal weight unclear but down 27 lbs from highest weight this admission.  Coox 69%. Continue digoxin + Toprol XL for rate control. Toprol increased to 25 BID 9/18. Amio drip d/c'd 9/19. Goal HR currently < 125 with diuresis. Avoiding anticoagulation due to aortic hematoma on admit. Continue SCDs. On Revatio for pulmonary hypertension.  Replete K and Mg for goal K > 4.0 and Mg >2.0.  K 3.5 this am.  Run of K this am. Also on spironolactone 12.5.  Length of Stay: [address]  Graciella Freer, [place] 7:05 AM 09/30/2014  Advanced Heart Failure Team Pager [phone] (M-F; 7a - 4p)  Please contact [facility] for night-coverage after hours (4p -7a ) and weekends on [email or web address]  Patient seen with PA, agree with the above note.    She continues to diurese well. Still volume overloaded.  Will continue Lasix gtt 15 mg/hr today and dose again with metolazone 5 mg daily.  Creatinine stable.   She is on milrinone for RV support.  Slowly titrating down.  Co-ox 69% today.  Will decrease to 0.125 mcg/kg/min, co-ox in am.   Remains in atrial fibrillation, rate today 100s-110s.  Stopped amiodarone gtt.  She has tolerated Toprol XL so far, will increase to 37.5 mg bid.  Continue  digoxin.  No anticoagulation yet due to aortic hematoma so will need to continue rate control.  Hopefully rate will improve further when milrinone titrated to off.   Walking with PT.   Marca Ancona 09/30/2014

## 2014-10-01 ENCOUNTER — Inpatient Hospital Stay (HOSPITAL_COMMUNITY): Payer: Medicare Other

## 2014-10-01 LAB — CBC
HCT: 31 % — ABNORMAL LOW (ref 36.0–46.0)
Hemoglobin: 9.8 g/dL — ABNORMAL LOW (ref 12.0–15.0)
MCH: 29.3 pg (ref 26.0–34.0)
MCHC: 31.6 g/dL (ref 30.0–36.0)
MCV: 92.5 fL (ref 78.0–100.0)
Platelets: 320 10*3/uL (ref 150–400)
RBC: 3.35 MIL/uL — ABNORMAL LOW (ref 3.87–5.11)
RDW: 15.5 % (ref 11.5–15.5)
WBC: 8.9 10*3/uL (ref 4.0–10.5)

## 2014-10-01 LAB — CARBOXYHEMOGLOBIN
Carboxyhemoglobin: 2.3 % — ABNORMAL HIGH (ref 0.5–1.5)
Methemoglobin: 0.8 % (ref 0.0–1.5)
O2 Saturation: 66.6 %
Total hemoglobin: 9.6 g/dL — ABNORMAL LOW (ref 12.0–16.0)

## 2014-10-01 LAB — BASIC METABOLIC PANEL
Anion gap: 10 (ref 5–15)
BUN: 18 mg/dL (ref 6–20)
CO2: 39 mmol/L — ABNORMAL HIGH (ref 22–32)
Calcium: 8.5 mg/dL — ABNORMAL LOW (ref 8.9–10.3)
Chloride: 80 mmol/L — ABNORMAL LOW (ref 101–111)
Creatinine, Ser: 1.53 mg/dL — ABNORMAL HIGH (ref 0.44–1.00)
GFR calc Af Amer: 38 mL/min — ABNORMAL LOW (ref >60)
GFR calc non Af Amer: 32 mL/min — ABNORMAL LOW (ref >60)
Glucose, Bld: 91 mg/dL (ref 65–99)
Potassium: 3.2 mmol/L — ABNORMAL LOW (ref 3.5–5.1)
Sodium: 129 mmol/L — ABNORMAL LOW (ref 135–145)

## 2014-10-01 LAB — GLUCOSE, CAPILLARY
GLUCOSE-CAPILLARY: 103 mg/dL — AB (ref 65–99)
GLUCOSE-CAPILLARY: 135 mg/dL — AB (ref 65–99)
GLUCOSE-CAPILLARY: 88 mg/dL (ref 65–99)
GLUCOSE-CAPILLARY: 99 mg/dL (ref 65–99)
Glucose-Capillary: 162 mg/dL — ABNORMAL HIGH (ref 65–99)
Glucose-Capillary: 153 mg/dL — ABNORMAL HIGH (ref 65–99)

## 2014-10-01 MED ORDER — POTASSIUM CHLORIDE CRYS ER 20 MEQ PO TBCR
40.0000 meq | EXTENDED_RELEASE_TABLET | Freq: Once | ORAL | Status: AC
  Administered 2014-10-01: 40 meq via ORAL
  Filled 2014-10-01: qty 2

## 2014-10-01 MED ORDER — SODIUM CHLORIDE 0.9 % IJ SOLN
10.0000 mL | INTRAMUSCULAR | Status: DC | PRN
  Administered 2014-10-02 – 2014-10-04 (×3): 10 mL
  Filled 2014-10-01 (×3): qty 40

## 2014-10-01 MED ORDER — TORSEMIDE 20 MG PO TABS
40.0000 mg | ORAL_TABLET | Freq: Every day | ORAL | Status: DC
  Administered 2014-10-02: 40 mg via ORAL
  Filled 2014-10-01: qty 2

## 2014-10-01 MED ORDER — POTASSIUM CHLORIDE CRYS ER 20 MEQ PO TBCR
40.0000 meq | EXTENDED_RELEASE_TABLET | Freq: Every day | ORAL | Status: DC
  Administered 2014-10-02 – 2014-10-06 (×5): 40 meq via ORAL
  Filled 2014-10-01 (×5): qty 2

## 2014-10-01 MED ORDER — LACTULOSE 10 GM/15ML PO SOLN
20.0000 g | Freq: Once | ORAL | Status: AC
  Administered 2014-10-01: 20 g via ORAL

## 2014-10-01 MED ORDER — METOLAZONE 2.5 MG PO TABS
5.0000 mg | ORAL_TABLET | Freq: Once | ORAL | Status: AC
  Administered 2014-10-01: 5 mg via ORAL
  Filled 2014-10-01: qty 2

## 2014-10-01 NOTE — Progress Notes (Signed)
Rehab admissions - I met with patient and gave her brochures about inpatient rehab.  Patient will have a copay of $295 a day for 6 days if she comes to inpatient rehab.  Patient not sure what she wants to do and will talk with her daughter.  OT consult is pending.  Will await OT consult completion and then submit to insurance carrier.  Call me for questions.  #[phone]

## 2014-10-01 NOTE — Progress Notes (Addendum)
      [address]       [place] [address]             [phone]      8 Days Post-Op Procedure(s) (LRB): REDO STERNOTOMY (N/A) REPAIR OF ASCENDING AORTIC PSEUDOANEURYSM (N/A) TRANSESOPHAGEAL ECHOCARDIOGRAM (TEE) (N/A)   Subjective:  Ashley [name] complains of mild constipation.  Otherwise she is doing fairly well.  She is ambulating with assistance.   Objective: Vital signs in last 24 hours: Temp:  [98 F (36.7 C)-98.3 F (36.8 C)] 98 F (36.7 C) (09/21 0321) Pulse Rate:  [44-132] 109 (09/21 0321) Cardiac Rhythm:  [-] Atrial fibrillation (09/21 0733) Resp:  [17-29] 20 (09/21 0321) BP: (81-131)/(54-80) 109/64 mmHg (09/21 0321) SpO2:  [93 %-100 %] 95 % (09/21 0321) Weight:  [169 lb 6.4 oz (76.839 kg)] 169 lb 6.4 oz (76.839 kg) (09/21 0911)  Intake/Output from previous day: 09/20 0701 - 09/21 0700 In: 420.2 [P.O.:240; I.V.:130.2; IV Piggyback:50] Out: 3175 [Urine:3175]  General appearance: alert, cooperative and no distress Heart: regular rate and rhythm Lungs: clear to auscultation bilaterally Abdomen: soft, non-tender; bowel sounds normal; no masses,  no organomegaly Extremities: edema 1-2+ Wound: clean and dry  Lab Results:  Recent Labs  09/30/14 0400 10/01/14 0305  WBC 7.8 8.9  HGB 10.0* 9.8*  HCT 31.3* 31.0*  PLT 296 320   BMET:  Recent Labs  09/30/14 0400 10/01/14 0305  NA 130* 129*  K 3.7 3.2*  CL 83* 80*  CO2 39* 39*  GLUCOSE 105* 91  BUN 16 18  CREATININE 1.37* 1.53*  CALCIUM 8.5* 8.5*    PT/INR: No results for input(s): LABPROT, INR in the last 72 hours. ABG    Component Value Date/Time   PHART 7.368 09/24/2014 1853   HCO3 20.9 09/24/2014 1853   TCO2 34 09/29/2014 1822   ACIDBASEDEF 4.0* 09/24/2014 1853   O2SAT 66.6 10/01/2014 0615   CBG (last 3)   Recent Labs  09/30/14 2359 10/01/14 0439 10/01/14 0906  GLUCAP 99 88 162*    Assessment/Plan: S/P Procedure(s) (LRB): REDO STERNOTOMY (N/A) REPAIR OF ASCENDING  AORTIC PSEUDOANEURYSM (N/A) TRANSESOPHAGEAL ECHOCARDIOGRAM (TEE) (N/A)  1. CV- NSR- weaning Milrinone as tolerated per HF team 2. Pulm- wean oxygen as tolerated, CXR with minimal effusion present 3. Renal- creatinine up to 1.53, on Lasix gtt, weight is at baseline, but 1-2+ pitting LE edema present, lasix per HF 4. DM- cbgs controlled, continue SSIP 5. Deconditioning- CIR consult placed 6. Dispo- patient stable, weaning Milrinone, Lasix gtt as tolerated, TED hose for edema  LOS: 11 days    [name], Ashley 10/01/2014  patient examined and medical record reviewed,agree with above note. [name] 10/02/2014

## 2014-10-01 NOTE — Progress Notes (Addendum)
Consulted for assistance with Revatio--per benefits check with insurance- Pt copay will be $18- prior auth not required --

## 2014-10-01 NOTE — Progress Notes (Signed)
Physical Therapy Treatment Patient Details Name: Ashley [name] MRN: [id number] DOB: Oct 14, 1940 Today's Date: 10/01/2014    History of Present Illness Adm with chest pain 9/10. found to have 7 cm pseudoaneurysm of ascending aorta; 9/13 re-do sternotomy for aorta repair; extubated 9/14 PMHx- bil shoulder surgery, C5-6 fusion, AVR and MVR 2012    PT Comments    Pt disoriented as she awakened today and attempting to get OOB without assist and pulling on bedrails on arrival. Unable to recall sternal precautions and required max cues for this and step by step instructions for mobility. More alert by end of walk. Discussed ?d/c plans and she states her daughter from [place] is coming to visit today and they will discuss. She does not think she can provide 24 hr care.    Follow Up Recommendations  CIR     Equipment Recommendations  None recommended by PT    Recommendations for Other Services OT consult     Precautions / Restrictions Precautions Precautions: Sternal;Fall Precaution Comments: Patient unable to recall precautions. The patient needs reinforcement to maintain precautions with functional mobility.   Restrictions Weight Bearing Restrictions: No (sternal precautions)    Mobility  Bed Mobility               General bed mobility comments: nearly up to EOB on arrival, pulling with UEs on bedrail  Transfers Overall transfer level: Needs assistance Equipment used: Rolling walker (2 wheeled) Transfers: Sit to/from Stand Sit to Stand: Min assist;From elevated surface         General transfer comment: continues to require vc for sequencing/set up; assist for anteior wt shift; from bed and BSC  Ambulation/Gait Ambulation/Gait assistance: Min assist Ambulation Distance (Feet): 160 Feet Assistive device: Rolling walker (2 wheeled) Gait Pattern/deviations: Step-through pattern;Step-to pattern;Decreased stride length;Drifts right/left Gait velocity: decr    General Gait Details: vc for safe use of RW; required repetitive cues and assist to maneuver RW when stepping sideways into narrow bathroom (unable to process how to complete the task without constant cues "what do I do next?"   Stairs            Wheelchair Mobility    Modified Rankin (Stroke Patients Only)       Balance     Sitting balance-Leahy Scale: Fair     Standing balance support: No upper extremity supported Standing balance-Leahy Scale: Fair                      Cognition Arousal/Alertness: Awake/alert Behavior During Therapy: Flat affect Overall Cognitive Status: Impaired/Different from baseline Area of Impairment: Awareness;Problem solving;Orientation;Attention;Memory;Safety/judgement Orientation Level: Place;Situation Current Attention Level: Sustained (difficulty attending when 2 people directing) Memory: Decreased recall of precautions;Decreased short-term memory   Safety/Judgement: Decreased awareness of safety;Decreased awareness of deficits Awareness: Intellectual Problem Solving: Slow processing;Requires verbal cues;Difficulty sequencing;Requires tactile cues General Comments: attempting to get OOB by herself on arrival; disoriented to place and situation although quickly re-oriented; did not recognize PT despite recognizing me yesterday; step by step cues needed for scoot, sit to stand, and maintain sternal precuations    Exercises      General Comments        Pertinent Vitals/Pain Pain Assessment: No/denies pain (except when coughing; uses pillow to splint)    Home Living                      Prior Function  PT Goals (current goals can now be found in the care plan section) Acute Rehab PT Goals Patient Stated Goal: be able to live alone again Time For Goal Achievement: 10/03/14 Progress towards PT goals: Not progressing toward goals - comment (more confused today)    Frequency  Min 3X/week    PT Plan  Current plan remains appropriate    Co-evaluation             End of Session Equipment Utilized During Treatment: Gait belt;Oxygen Activity Tolerance: Patient tolerated treatment well Patient left: Other (comment) (with NT and transporter getting into w/c for xray)     Time: 0828-0904 PT Time Calculation (min) (ACUTE ONLY): 36 min  Charges:  $Gait Training: 23-37 mins                    G Codes:      SASSER,LYNN October 22, 2014, 9:19 AM  Pager [phone]

## 2014-10-01 NOTE — Progress Notes (Signed)
Patient ID: Ashley [name], female   DOB: 1940-02-12, 74 y.o.   MRN: [id number]  Advanced Heart Failure Rounding Note  Primary Physician: Dr Karsten Fells Primary Cardiologist: Dr Armanda Magic previously. Primary HF: Bensimhon  Subjective:    Successfully extubated 09/24/14 after weaning off NO. Swan d/c 09/25/14  Feels much better. Denies dyspnea. Ambulating.  On milrinone 0.125 and Lasix 15 mg/hr, got 5 mg metolazone again yesterday. Coox 67% Cr up slightly 1.37 -> 1.53  She continues to diurese well on current regimen.  Out 2.7 L and down 3 lbs.    Objective:   Weight Range: 172 lb 6.4 oz (78.2 kg) Body mass index is 30.55 kg/(m^2).   Vital Signs:   Temp:  [98 F (36.7 C)-98.3 F (36.8 C)] 98 F (36.7 C) (09/21 0321) Pulse Rate:  [44-132] 109 (09/21 0321) Resp:  [17-29] 20 (09/21 0321) BP: (81-131)/(54-80) 109/64 mmHg (09/21 0321) SpO2:  [93 %-100 %] 95 % (09/21 0321) Last BM Date: 09/30/14  Weight change: Filed Weights   09/28/14 0700 09/29/14 0600 09/30/14 0500  Weight: 185 lb 6.5 oz (84.1 kg) 178 lb 5.6 oz (80.9 kg) 172 lb 6.4 oz (78.2 kg)    Intake/Output:   Intake/Output Summary (Last 24 hours) at 10/01/14 0854 Last data filed at 10/01/14 0324  Gross per 24 hour  Intake  108.5 ml  Output   2825 ml  Net -2716.5 ml     Physical Exam: General: NAD, reclined in bed HEENT: Normal Neck: supple. JVP 10 cm. Carotids 2+ bilat; no bruits. No lymphadenopathy or thyromegaly. R Cordis in place. Cor: PMI nondisplaced. Tachy. Irregularly irregular. 2/6 TR Lungs: Clear, slightly diminished bases Abdomen: soft, nontender, nondistended. No hepatosplenomegaly. No bruits or masses noted. +BS Extremities: no cyanosis, clubbing, rash. 1+ LE edema to mid calf. GU: Foley Neuro: Moves all 4 extremities without difficulty. No focal deficits. Affect flat but appropriate   Telemetry: Afib RVR 100s-120s  Labs: CBC  Recent Labs  09/30/14 0400 10/01/14 0305  WBC 7.8  8.9  HGB 10.0* 9.8*  HCT 31.3* 31.0*  MCV 92.9 92.5  PLT 296 320   Basic Metabolic Panel  Recent Labs  09/29/14 0414  09/30/14 0400 10/01/14 0305  NA 130*  < > 130* 129*  K 3.5  < > 3.7 3.2*  CL 87*  < > 83* 80*  CO2 36*  --  39* 39*  GLUCOSE 105*  < > 105* 91  BUN 14  < > 16 18  CALCIUM 8.4*  --  8.5* 8.5*  MG 2.1  --   --   --   < > = values in this interval not displayed. Liver Function Tests  Recent Labs  09/29/14 0414  AST 34  ALT 35  ALKPHOS 107  BILITOT 0.8  PROT 5.8*  ALBUMIN 2.6*   No results for input(s): LIPASE, AMYLASE in the last 72 hours. Cardiac Enzymes No results for input(s): CKTOTAL, CKMB, CKMBINDEX, TROPONINI in the last 72 hours.  BNP: BNP (last 3 results) No results for input(s): BNP in the last 8760 hours.  ProBNP (last 3 results) No results for input(s): PROBNP in the last 8760 hours.   D-Dimer No results for input(s): DDIMER in the last 72 hours. Hemoglobin A1C No results for input(s): HGBA1C in the last 72 hours. Fasting Lipid Panel No results for input(s): CHOL, HDL, LDLCALC, TRIG, CHOLHDL, LDLDIRECT in the last 72 hours. Thyroid Function Tests No results for input(s): TSH, T4TOTAL, T3FREE, THYROIDAB in  the last 72 hours.  Invalid input(s): FREET3  Other results:     Imaging/Studies:  Dg Chest Port 1 View  09/30/2014   CLINICAL DATA:  Followup the ascending aortic pseudoaneurysm repair.  EXAM: PORTABLE CHEST - 1 VIEW  COMPARISON:  Yesterday.  FINDINGS: Stable enlarged cardiac silhouette. Right jugular catheter tip in the superior vena cava. No significant change in bilateral atelectasis. Median sternotomy wires and prosthetic heart valves. Mild scoliosis.  IMPRESSION: Stable cardiomegaly and bilateral atelectasis.   Electronically Signed   By: Beckie Salts M.D.   On: 09/30/2014 08:35   Dg Abd Portable 1v  09/29/2014   CLINICAL DATA:  Duodenal ileus. Additional history of ascending aortic aneurysm repair on 09/23/2014.   EXAM: PORTABLE ABDOMEN - 1 VIEW  COMPARISON:  Abdominal plain film dated 09/28/2014.  FINDINGS: The partial distention of the colon has resolved in the interval. Large bowel now appears normal in caliber throughout. There are no dilated small bowel loops seen. There is no evidence of soft tissue mass or abnormal fluid collection. No free intraperitoneal air seen.  Again noted are the surgical changes of previous cholecystectomy and abdominal wall hernia repair. Small caliber pelvic catheter unchanged in position. Degenerative changes again noted within the lumbar spine, mild to moderate in degree. No acute osseous abnormality.  IMPRESSION: No evidence of acute intra-abdominal or intrapelvic abnormality seen. Bowel gas pattern is now nonobstructive. No evidence of bowel obstruction or ileus.   Electronically Signed   By: Bary Richard M.D.   On: 09/29/2014 16:06    Latest Echo  Latest Cath   Medications:     Scheduled Medications: . antiseptic oral rinse  7 mL Mouth Rinse BID  . aspirin EC  81 mg Oral Daily  . bisacodyl  10 mg Oral Daily   Or  . bisacodyl  10 mg Rectal Daily  . cephALEXin  500 mg Oral 3 times per day  . digoxin  0.125 mg Intravenous Daily  . docusate sodium  200 mg Oral Daily  . enoxaparin (LOVENOX) injection  30 mg Subcutaneous Q24H  . Influenza vac split quadrivalent PF  0.5 mL Intramuscular Tomorrow-1000  . insulin aspart  0-24 Units Subcutaneous 6 times per day  . metoCLOPramide (REGLAN) injection  10 mg Intravenous 4 times per day  . metoprolol succinate  37.5 mg Oral BID  . pantoprazole  40 mg Oral Daily  . sildenafil  60 mg Oral TID  . sodium chloride  3 mL Intravenous Q12H  . spironolactone  12.5 mg Oral Daily    Infusions: . sodium chloride 20 mL/hr at 09/25/14 1000  . sodium chloride    . sodium chloride 20 mL/hr at 09/29/14 1800  . furosemide (LASIX) infusion 15 mg/hr (09/30/14 2054)  . lactated ringers Stopped (09/23/14 2300)  . milrinone 0.125  mcg/kg/min (09/30/14 1625)  . norepinephrine (LEVOPHED) Adult infusion Stopped (09/25/14 0615)    PRN Medications: sodium chloride, Gerhardt's butt cream, metoprolol, midazolam, ondansetron (ZOFRAN) IV, oxyCODONE, promethazine, sodium chloride, traMADol   Assessment/Plan   1. 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 H/o AVR (bioprosthesis) and MV ring 2012 3. 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  4. AKI 5. Persistent atrial fibrillation with RVR.  6. Acute respiratory failure: Resolved.  7. Pulmonary arterial hypertension.   She remains on milrinone at 0.125 for RV support.  Volume status improving but remains elevated with RV failure. CVP remains 13 and 1+ LE edema. Cr up stable 1.3 -> 1.4 -> 1.37.  Continue lasix gtt at 15  Goal weight unclear but down 30 lbs from highest weight this admission.  Coox 66.6% today on Milrinone 0.125. Continue digoxin + Toprol XL for rate control. Toprol increased to 37.5 BID 9/20. Amio drip d/c'd 9/19. Goal HR currently < 125 with diuresis. Avoiding anticoagulation due to aortic hematoma on admit. Continue SCDs. On Revatio for pulmonary hypertension.  Replete K and Mg for goal K > 4.0 and Mg >2.0.  K 3.2 this am. Will give 40 meq x 2 today and start 40 meq daily tomorrow per pharm recommendation.  Also on spironolactone 12.5.  Length of Stay: [address], [place] 8:54 AM 10/01/2014  Advanced Heart Failure Team Pager [phone] (M-F; 7a - 4p)  Please contact [facility] for night-coverage after hours (4p -7a ) and weekends on [email or web address]  Patient seen and examined with Otilio Saber, PA-C. We discussed all aspects of the encounter. I agree with the assessment and plan as stated above.   Much improved today. Weight back to baseline. Co-ox good.  Ambulating. Will stop milrinone and IV lasix. Continue sildenafil. Start torsemide. Pull Foley. Likely will need short SNF stay. Discussed with Dr. Donata Clay at bedside.   Bensimhon, Daniel,MD 10:49 PM

## 2014-10-01 NOTE — Progress Notes (Signed)
Peripherally Inserted Central Catheter/Midline Placement  The IV Nurse has discussed with the patient and/or persons authorized to consent for the patient, the purpose of this procedure and the potential benefits and risks involved with this procedure.  The benefits include less needle sticks, lab draws from the catheter and patient may be discharged home with the catheter.  Risks include, but not limited to, infection, bleeding, blood clot (thrombus formation), and puncture of an artery; nerve damage and irregular heat beat.  Alternatives to this procedure were also discussed.  PICC/Midline Placement Documentation        [name] 10/01/2014, 3:09 PM

## 2014-10-01 NOTE — Progress Notes (Signed)
CSW met with pt and dtr at bedside and provided bed offers.  They will review and discuss with other family members.  CSW will continue to follow.  Domenica Reamer, [place] Social Worker [phone]

## 2014-10-02 LAB — GLUCOSE, CAPILLARY
GLUCOSE-CAPILLARY: 102 mg/dL — AB (ref 65–99)
GLUCOSE-CAPILLARY: 121 mg/dL — AB (ref 65–99)
GLUCOSE-CAPILLARY: 126 mg/dL — AB (ref 65–99)
GLUCOSE-CAPILLARY: 149 mg/dL — AB (ref 65–99)
Glucose-Capillary: 96 mg/dL (ref 65–99)
Glucose-Capillary: 140 mg/dL — ABNORMAL HIGH (ref 65–99)
Glucose-Capillary: 123 mg/dL — ABNORMAL HIGH (ref 65–99)

## 2014-10-02 LAB — BASIC METABOLIC PANEL
ANION GAP: 8 (ref 5–15)
BUN: 18 mg/dL (ref 6–20)
CHLORIDE: 81 mmol/L — AB (ref 101–111)
CO2: 42 mmol/L — AB (ref 22–32)
CREATININE: 1.37 mg/dL — AB (ref 0.44–1.00)
Calcium: 9 mg/dL (ref 8.9–10.3)
GFR calc non Af Amer: 37 mL/min — ABNORMAL LOW (ref >60)
GFR, EST AFRICAN AMERICAN: 43 mL/min — AB (ref >60)
Glucose, Bld: 128 mg/dL — ABNORMAL HIGH (ref 65–99)
Potassium: 3.6 mmol/L (ref 3.5–5.1)
Sodium: 131 mmol/L — ABNORMAL LOW (ref 135–145)

## 2014-10-02 LAB — CARBOXYHEMOGLOBIN
Carboxyhemoglobin: 2 % — ABNORMAL HIGH (ref 0.5–1.5)
Methemoglobin: 0.9 % (ref 0.0–1.5)
O2 Saturation: 71.6 %
Total hemoglobin: 10 g/dL — ABNORMAL LOW (ref 12.0–16.0)

## 2014-10-02 MED ORDER — RISAQUAD PO CAPS
1.0000 | ORAL_CAPSULE | Freq: Two times a day (BID) | ORAL | Status: DC
  Administered 2014-10-03 – 2014-10-06 (×7): 1 via ORAL
  Filled 2014-10-02 (×12): qty 1

## 2014-10-02 MED ORDER — SILDENAFIL CITRATE 20 MG PO TABS
40.0000 mg | ORAL_TABLET | Freq: Three times a day (TID) | ORAL | Status: DC
  Administered 2014-10-02 – 2014-10-06 (×11): 40 mg via ORAL
  Filled 2014-10-02 (×15): qty 2

## 2014-10-02 MED ORDER — INSULIN ASPART 100 UNIT/ML ~~LOC~~ SOLN
0.0000 [IU] | SUBCUTANEOUS | Status: DC
  Administered 2014-10-02 – 2014-10-04 (×6): 2 [IU] via SUBCUTANEOUS

## 2014-10-02 MED ORDER — MAGNESIUM HYDROXIDE 400 MG/5ML PO SUSP
30.0000 mL | Freq: Every day | ORAL | Status: AC
  Administered 2014-10-02: 30 mL via ORAL
  Filled 2014-10-02: qty 30

## 2014-10-02 NOTE — Progress Notes (Signed)
Pt ambulated 371ft with NT on 1L N/C using walker. Pt tolerated well and returned to chair in room; call bell within reach. RN will cont to monitor.

## 2014-10-02 NOTE — Progress Notes (Addendum)
CARDIAC REHAB PHASE I   PRE:  Rate/Rhythm: 88 afib    BP: sitting 113/65    SaO2: 96 1L, 89 RA  MODE:  Ambulation: 350 ft   POST:  Rate/Rhythm: 120 afib    BP: sitting 128/90     SaO2: 97 2L  Pt moving well with min assist. Used RW. SaO2 89 RA on EOB so used O2 for walking today. To recliner after walk, no major c/o. Encouraged IS. [phone]  Elissa Lovett [place] CES, ACSM 10/02/2014 11:48 AM

## 2014-10-02 NOTE — Progress Notes (Signed)
Rehab admissions - I called patient's daughter, Dennie Bible.  They are interested in a rehab outside the hospital to avoid the copays of in house rehab.  Also patient does not live here in [place].  I will let case manager and social worker know of need for SNF level rehab.  Call me for questions. #[phone]

## 2014-10-02 NOTE — Progress Notes (Addendum)
Patient ID: Ashley [name], female   DOB: 21-Dec-1940, 74 y.o.   MRN: [id number]  Advanced Heart Failure Rounding Note  Primary Physician: Dr Karsten Fells Primary Cardiologist: Dr Armanda Magic previously. Primary HF: Bensimhon  Subjective:    Successfully extubated 09/24/14 after weaning off NO. Swan d/c 09/25/14 Cordis removed 10/02/14 in am  Cordis removed this am.  So has been lying flat for the last little while.  Ambulating without difficulty, Says weight at home prior to becoming ill was 163.  Weighs 164 lbs this am. Denies SOB.  Mostly sore when she coughs, no breakthrough pain.  Coox 71.6%. Milrinone stopped 9/21.  Out 0.9L and weight shows down 5 lbs. Lasix gtt stopped 9/21, transition to po torsemide today.    Objective:   Weight Range: 164 lb 11.2 oz (74.707 kg) Body mass index is 29.18 kg/(m^2).   Vital Signs:   Temp:  [98.7 F (37.1 C)-99 F (37.2 C)] 98.7 F (37.1 C) (09/22 0420) Pulse Rate:  [75-114] 97 (09/22 0719) Resp:  [18] 18 (09/22 0420) BP: (102-120)/(63-80) 115/63 mmHg (09/22 0719) SpO2:  [90 %-99 %] 99 % (09/22 0420) Weight:  [164 lb 11.2 oz (74.707 kg)-169 lb 6.4 oz (76.839 kg)] 164 lb 11.2 oz (74.707 kg) (09/22 0420) Last BM Date: 10/01/14  Weight change: Filed Weights   09/30/14 0500 10/01/14 0911 10/02/14 0420  Weight: 172 lb 6.4 oz (78.2 kg) 169 lb 6.4 oz (76.839 kg) 164 lb 11.2 oz (74.707 kg)    Intake/Output:   Intake/Output Summary (Last 24 hours) at 10/02/14 0910 Last data filed at 10/02/14 0856  Gross per 24 hour  Intake    240 ml  Output    920 ml  Net   -680 ml     Physical Exam: General: NAD,flat in bed HEENT: Normal Neck: supple. JVP 10 cm. Carotids 2+ bilat; no bruits. No lymphadenopathy or thyromegaly noted. R neck bandage clean, dry, and intact from cordis removal.  Cor: PMI nondisplaced. Irregularly irregular. 2/6 TR Lungs: CTA, diminished bases. Abdomen: soft, NT, ND. No HSM. No bruits or masses noted.  +BS Extremities: no cyanosis, clubbing, rash. TED hose in place.  Trace ankle edema. GU: Foley Neuro: Moves all 4 extremities without difficulty. No focal deficits. Affect flat but appropriate   Telemetry: Afib 80-90s  Labs: CBC  Recent Labs  09/30/14 0400 10/01/14 0305  WBC 7.8 8.9  HGB 10.0* 9.8*  HCT 31.3* 31.0*  MCV 92.9 92.5  PLT 296 320   Basic Metabolic Panel  Recent Labs  09/30/14 0400 10/01/14 0305  NA 130* 129*  K 3.7 3.2*  CL 83* 80*  CO2 39* 39*  GLUCOSE 105* 91  BUN 16 18  CALCIUM 8.5* 8.5*   Liver Function Tests No results for input(s): AST, ALT, ALKPHOS, BILITOT, PROT, ALBUMIN in the last 72 hours. No results for input(s): LIPASE, AMYLASE in the last 72 hours. Cardiac Enzymes No results for input(s): CKTOTAL, CKMB, CKMBINDEX, TROPONINI in the last 72 hours.  BNP: BNP (last 3 results) No results for input(s): BNP in the last 8760 hours.  ProBNP (last 3 results) No results for input(s): PROBNP in the last 8760 hours.   D-Dimer No results for input(s): DDIMER in the last 72 hours. Hemoglobin A1C No results for input(s): HGBA1C in the last 72 hours. Fasting Lipid Panel No results for input(s): CHOL, HDL, LDLCALC, TRIG, CHOLHDL, LDLDIRECT in the last 72 hours. Thyroid Function Tests No results for input(s): TSH, T4TOTAL, T3FREE, THYROIDAB in  the last 72 hours.  Invalid input(s): FREET3  Other results:     Imaging/Studies:  Dg Chest 2 View  10/01/2014   CLINICAL DATA:  Shortness of breath.  Weakness.  EXAM: CHEST  2 VIEW  COMPARISON:  09/30/2014  FINDINGS: Right IJ line tip: SVC. Prosthetic cardiac valves. Atherosclerotic aortic arch. Moderate enlargement of the cardiopericardial silhouette.  Small amount of fluid in the minor fissure on the right. Scarring or atelectasis in the right upper lobe. Trace bilateral pleural effusions with blunting of the posterior costophrenic angles. Epicardial pacer leads. Currently no overt edema.   IMPRESSION: 1. Moderate enlargement of the cardiopericardial silhouette, with trace bilateral pleural effusions but no current overt edema. 2. Scarring or atelectasis in the right upper lobe.   Electronically Signed   By: Gaylyn Rong M.D.   On: 10/01/2014 09:36   Dg Chest Port 1 View  10/01/2014   CLINICAL DATA:  Left-sided PICC placement.  EXAM: PORTABLE CHEST - 1 VIEW  COMPARISON:  10/01/2014  FINDINGS: Right IJ central line tip overlies superior vena cava. Left-sided PICC line has been placed, tip overlying the lower superior vena cava.  Heart is enlarged. Status post median sternotomy and valve replacement. There is subsegmental atelectasis at the left lung base and right perihilar region. Improved aeration at the right lung base. No pneumothorax. No pulmonary edema.  IMPRESSION: 1. Interval placement of left-sided PICC line, tip overlying the level of the lower superior vena cava. 2. Improved aeration at the right lung base.  No pneumothorax.   Electronically Signed   By: Norva Pavlov M.D.   On: 10/01/2014 16:10    Latest Echo  Latest Cath   Medications:     Scheduled Medications: . antiseptic oral rinse  7 mL Mouth Rinse BID  . aspirin EC  81 mg Oral Daily  . bisacodyl  10 mg Oral Daily   Or  . bisacodyl  10 mg Rectal Daily  . cephALEXin  500 mg Oral 3 times per day  . digoxin  0.125 mg Intravenous Daily  . docusate sodium  200 mg Oral Daily  . enoxaparin (LOVENOX) injection  30 mg Subcutaneous Q24H  . Influenza vac split quadrivalent PF  0.5 mL Intramuscular Tomorrow-1000  . insulin aspart  0-24 Units Subcutaneous 6 times per day  . metoCLOPramide (REGLAN) injection  10 mg Intravenous 4 times per day  . metoprolol succinate  37.5 mg Oral BID  . pantoprazole  40 mg Oral Daily  . potassium chloride  40 mEq Oral Daily  . sildenafil  60 mg Oral TID  . sodium chloride  3 mL Intravenous Q12H  . spironolactone  12.5 mg Oral Daily  . torsemide  40 mg Oral Daily     Infusions: . sodium chloride 20 mL/hr at 09/25/14 1000  . sodium chloride    . sodium chloride 20 mL/hr at 09/29/14 1800  . lactated ringers Stopped (09/23/14 2300)    PRN Medications: sodium chloride, Gerhardt's butt cream, metoprolol, midazolam, ondansetron (ZOFRAN) IV, oxyCODONE, promethazine, sodium chloride, sodium chloride, traMADol   Assessment/Plan   1. 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 H/o AVR (bioprosthesis) and MV ring 2012 3. 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  4. AKI 5. Persistent atrial fibrillation with RVR.  6. Acute respiratory failure: Resolved.  7. Pulmonary arterial hypertension.   Volume  status good. Will transition to po diuretics today with 40 mg Torsemide daily.  Previous baseline weight 163 per pt.  164 lbs this am. Down > 30 lbs from highest weight this admission.  Coox 71.6% today off Milrinone. Continue Revatio for pulmonary hypertension. Cordis d/c'd this am.  Continue digoxin + Toprol XL for rate control. Toprol increased to 37.5 BID 9/20. Amio drip d/c'd 9/19. Goal HR currently < 125 with diuresis.   Avoiding anticoagulation due to aortic hematoma on admit. Continue SCDs.   Replete K and Mg for goal K > 4.0 and Mg >2.0.  BMET pending. On spironolactone 12.5.  Length of Stay: [address]  Mariam Dollar [place], [place] 9:10 AM 10/02/2014  Advanced Heart Failure Team Pager [phone] (M-F; 7a - 4p)  Please contact [facility] for night-coverage after hours (4p -7a ) and weekends on [email or web address]  Patient seen and examined with Otilio Saber, PA-C. We discussed all aspects of the encounter. I agree with the assessment and plan as stated above.   She looks great. Volume status is good. Now on po diuretics. Watch renal function closely. Continue Revatio. Can drop to   tid - will need to get approval. I will consult case management. Likely can go to SNF tomorrow. Will discuss anti-coagulation with Dr. Donata Clay. I favor Eliquis, if he feels she is acceptable candidate.   Bensimhon, Daniel,MD 4:33 PM

## 2014-10-02 NOTE — Evaluation (Signed)
Occupational Therapy Evaluation Patient Details Name: Ashley [name] MRN: [id number] DOB: [birth year] Today's Date: 10/02/2014    History of Present Illness Adm with chest pain 9/10. found to have 7 cm pseudoaneurysm of ascending aorta; 9/13 re-do sternotomy for aorta repair; extubated 9/14 PMHx- bil shoulder surgery, C5-6 fusion, AVR and MVR 2012   Clinical Impression   Patient presenting with deconditioning and decreased ADL, functional mobility independence. Patient independent>mod I PTA. Patient currently functioning at an overall min guard>min assist level. Patient will benefit from acute OT to increase overall independence in the areas of ADLs, functional mobility, and overall safety in order to safely discharge to venue listed below.   Pt 95% on 2 L/min via [place] at beginning of session. Pt on RA during activity and 02 sats decreased > 84%. Donned [place] at end of session.     Follow Up Recommendations  CIR    Equipment Recommendations  Other (comment) (TBD)    Recommendations for Other Services  None at this time    Precautions / Restrictions Precautions Precautions: Sternal;Fall Precaution Comments: Pt requires cues to verbalize and adhere to sternal precautions  Restrictions Weight Bearing Restrictions: No    Mobility Bed Mobility General bed mobility comments: Pt up in recliner upon OT entering/exiting room  Transfers Overall transfer level: Needs assistance Equipment used: None Transfers: Sit to/from Stand Sit to Stand: Min guard General transfer comment: Min guard for safety, cues for technique and safety    Balance Overall balance assessment: Needs assistance Sitting-balance support: No upper extremity supported;Feet supported Sitting balance-Leahy Scale: Good     Standing balance support: No upper extremity supported;During functional activity Standing balance-Leahy Scale: Poor    ADL Overall ADL's : Needs assistance/impaired Eating/Feeding: Set  up;Sitting   Grooming: Min guard;Supervision/safety;Standing Grooming Details (indicate cue type and reason): at sink, pt needed to sit due to complaints of "my legs are getting weak".  Upper Body Bathing: Set up;Sitting   Lower Body Bathing: Min guard;Sit to/from stand   Upper Body Dressing : Set up;Sitting   Lower Body Dressing: Minimal assistance;Sit to/from stand   Toilet Transfer: Min guard;BSC General ADL Comments: Pt requires up to min assist for LB ADLs due to decreased activity tolerance/endurance and decreased mobility. Pt stood from recliner and with incontinence upon standing. Pt ambulated > sink with min guard assist. Pt stood at sink to clean self (peri care due to incontinence). Pt sat on BSC secondary to complaints of "my legs are getting weak". Pt ambulated back to recliner and sat. 02 sats =85% on room air, therefore donned 2 L/min via . Notified RN that pt up in recliner    Pertinent Vitals/Pain Pain Assessment: No/denies pain (except when coughing)     Hand Dominance Right   Extremity/Trunk Assessment Upper Extremity Assessment Upper Extremity Assessment: Generalized weakness (unable to fully assess secondary to sternal precautions)   Lower Extremity Assessment Lower Extremity Assessment: Defer to PT evaluation   Cervical / Trunk Assessment Cervical / Trunk Assessment: Other exceptions Cervical / Trunk Exceptions: sternotomy   Communication Communication Communication: No difficulties   Cognition Arousal/Alertness: Awake/alert Behavior During Therapy: Flat affect Overall Cognitive Status: Impaired/Different from baseline Area of Impairment: Awareness;Problem solving;Orientation;Attention;Memory;Safety/judgement Orientation Level: Time Current Attention Level: Sustained Memory: Decreased recall of precautions;Decreased short-term memory Following Commands: Follows one step commands with increased time Safety/Judgement: Decreased awareness of  safety;Decreased awareness of deficits Awareness: Emergent Problem Solving: Slow processing;Requires verbal cues;Difficulty sequencing;Requires tactile cues  Home Living Family/patient expects to be discharged to:: Private residence Living Arrangements: Alone Available Help at Discharge: Family;Available PRN/intermittently;Neighbor (per pt report) Type of Home: Apartment Home Access: Level entry     Home Layout: One level     Bathroom Shower/Tub: Tub/shower unit;Curtain   Firefighter: Standard     Home Equipment: Environmental consultant - 2 wheels;Shower seat   Prior Functioning/Environment Level of Independence: Independent     OT Diagnosis: Generalized weakness   OT Problem List: Decreased strength;Decreased activity tolerance;Decreased knowledge of use of DME or AE;Decreased cognition;Decreased safety awareness   OT Treatment/Interventions: Self-care/ADL training;Therapeutic exercise;Energy conservation;DME and/or AE instruction;Patient/family education;Balance training    OT Goals(Current goals can be found in the care plan section) Acute Rehab OT Goals Patient Stated Goal: be able to live alone again OT Goal Formulation: With patient Time For Goal Achievement: 10/16/14 Potential to Achieve Goals: Good ADL Goals Pt Will Perform Grooming: with supervision;standing Pt Will Perform Lower Body Bathing: with supervision;sit to/from stand (AE prn) Pt Will Perform Lower Body Dressing: with supervision;sit to/from stand (AE prn) Pt Will Transfer to Toilet: with supervision;ambulating;bedside commode Pt Will Perform Tub/Shower Transfer: Tub transfer;shower seat;ambulating;with supervision Additional ADL Goal #1: Pt will be able to verbalize at least 3 energy conservation techniques in order to increase safety and independence with ADLs and functional mobility   OT Frequency: Min 2X/week   Barriers to D/C: Decreased caregiver support   End of Session Equipment Utilized  During Treatment: Gait belt Nurse Communication: Other (comment) (pt up in recliner)  Activity Tolerance: Patient tolerated treatment well Patient left: in chair;with call bell/phone within reach   Time: 1228-1256 OT Time Calculation (min): 28 min Charges:  OT General Charges $OT Visit: 1 Procedure OT Evaluation $Initial OT Evaluation Tier I: 1 Procedure OT Treatments $Self Care/Home Management : 8-22 mins  CLAY,PATRICIA , MS, OTR/L, CLT Pager: [phone]  10/02/2014, 1:36 PM

## 2014-10-02 NOTE — Progress Notes (Addendum)
      [address]       [place] [address]             [phone]      9 Days Post-Op Procedure(s) (LRB): REDO STERNOTOMY (N/A) REPAIR OF ASCENDING AORTIC PSEUDOANEURYSM (N/A) TRANSESOPHAGEAL ECHOCARDIOGRAM (TEE) (N/A)   Subjective:  Ashley [name] has no complaints she is feeling better this morning.  Objective: Vital signs in last 24 hours: Temp:  [98.7 F (37.1 C)-99 F (37.2 C)] 98.7 F (37.1 C) (09/22 0420) Pulse Rate:  [75-114] 97 (09/22 0719) Cardiac Rhythm:  [-] Atrial fibrillation (09/22 0726) Resp:  [18] 18 (09/22 0420) BP: (102-120)/(63-80) 115/63 mmHg (09/22 0719) SpO2:  [90 %-99 %] 99 % (09/22 0420) Weight:  [164 lb 11.2 oz (74.707 kg)-169 lb 6.4 oz (76.839 kg)] 164 lb 11.2 oz (74.707 kg) (09/22 0420)  Intake/Output from previous day: 09/21 0701 - 09/22 0700 In: -  Out: 920 [Urine:920] Intake/Output this shift: Total I/O In: 240 [P.O.:240] Out: -   General appearance: alert, cooperative and no distress Heart: regular rate and rhythm Lungs: clear to auscultation bilaterally Abdomen: soft, non-tender; bowel sounds normal; no masses,  no organomegaly Extremities: edema 1-2+ Wound: clean and dry  Lab Results:  Recent Labs  09/30/14 0400 10/01/14 0305  WBC 7.8 8.9  HGB 10.0* 9.8*  HCT 31.3* 31.0*  PLT 296 320   BMET:  Recent Labs  09/30/14 0400 10/01/14 0305  NA 130* 129*  K 3.7 3.2*  CL 83* 80*  CO2 39* 39*  GLUCOSE 105* 91  BUN 16 18  CREATININE 1.37* 1.53*  CALCIUM 8.5* 8.5*    PT/INR: No results for input(s): LABPROT, INR in the last 72 hours. ABG    Component Value Date/Time   PHART 7.368 09/24/2014 1853   HCO3 20.9 09/24/2014 1853   TCO2 34 09/29/2014 1822   ACIDBASEDEF 4.0* 09/24/2014 1853   O2SAT 71.6 10/02/2014 0502   CBG (last 3)   Recent Labs  10/02/14 0013 10/02/14 0417 10/02/14 0847  GLUCAP 149* 96 140*    Assessment/Plan: S/P Procedure(s) (LRB): REDO STERNOTOMY (N/A) REPAIR OF ASCENDING  AORTIC PSEUDOANEURYSM (N/A) TRANSESOPHAGEAL ECHOCARDIOGRAM (TEE) (N/A)  1. CV- hemodynamically stable,- Milrinone weaned off yesterday- continue digoxin, Lopressor, and Revatio 2. Pulm- wean oxygen as tolerated, continue IS 3. Renal- off Lasix gtt, continue Aldactone, Demedex- repeat BMET in AM 4. DM- cbgs controlled, continue SSIP, new diagnosis will get education 5. Deconditioning- continue PT, OT- likely for SNF at discharge 6. Dispo- patient stable, continue current care,   LOS: 12 days    [name], [name] 10/02/2014  Still titrating HF meds- aim for SNF in [facility]  Patient prefers [facility] in [place] patient examined and medical record reviewed,agree with above note. [name] 10/02/2014

## 2014-10-02 NOTE — Care Management Important Message (Signed)
Important Message  Patient Details  Name: ANTANASIA KACZYNSKI MRN: [id number] Date of Birth: 02/04/1940   Medicare Important Message Given:  Yes-fourth notification given    Orson Aloe 10/02/2014, 9:13 AM

## 2014-10-02 NOTE — Progress Notes (Signed)
Received consult again for Revatio- per benefits check pt does not need prior auth- see below note done on 10/01/14  Consulted for assistance with Revatio--per benefits check with insurance- Pt copay will be $18- prior auth not required --

## 2014-10-03 LAB — BASIC METABOLIC PANEL
Anion gap: 10 (ref 5–15)
BUN: 20 mg/dL (ref 6–20)
CHLORIDE: 83 mmol/L — AB (ref 101–111)
CO2: 42 mmol/L — AB (ref 22–32)
Calcium: 9.1 mg/dL (ref 8.9–10.3)
Creatinine, Ser: 1.48 mg/dL — ABNORMAL HIGH (ref 0.44–1.00)
GFR calc Af Amer: 39 mL/min — ABNORMAL LOW (ref >60)
GFR calc non Af Amer: 34 mL/min — ABNORMAL LOW (ref >60)
GLUCOSE: 104 mg/dL — AB (ref 65–99)
Potassium: 3.8 mmol/L (ref 3.5–5.1)
SODIUM: 135 mmol/L (ref 135–145)

## 2014-10-03 LAB — GLUCOSE, CAPILLARY
GLUCOSE-CAPILLARY: 134 mg/dL — AB (ref 65–99)
GLUCOSE-CAPILLARY: 123 mg/dL — AB (ref 65–99)
Glucose-Capillary: 100 mg/dL — ABNORMAL HIGH (ref 65–99)
Glucose-Capillary: 150 mg/dL — ABNORMAL HIGH (ref 65–99)
Glucose-Capillary: 94 mg/dL (ref 65–99)

## 2014-10-03 LAB — CBC
HCT: 30.4 % — ABNORMAL LOW (ref 36.0–46.0)
Hemoglobin: 9.7 g/dL — ABNORMAL LOW (ref 12.0–15.0)
MCH: 30.4 pg (ref 26.0–34.0)
MCHC: 31.9 g/dL (ref 30.0–36.0)
MCV: 95.3 fL (ref 78.0–100.0)
Platelets: 370 10*3/uL (ref 150–400)
RBC: 3.19 MIL/uL — ABNORMAL LOW (ref 3.87–5.11)
RDW: 16 % — ABNORMAL HIGH (ref 11.5–15.5)
WBC: 7.4 10*3/uL (ref 4.0–10.5)

## 2014-10-03 MED ORDER — DIPHENHYDRAMINE HCL 25 MG PO CAPS
25.0000 mg | ORAL_CAPSULE | Freq: Four times a day (QID) | ORAL | Status: DC | PRN
  Administered 2014-10-03: 25 mg via ORAL
  Filled 2014-10-03: qty 1

## 2014-10-03 MED ORDER — SODIUM CHLORIDE 0.9 % IV BOLUS (SEPSIS)
500.0000 mL | Freq: Once | INTRAVENOUS | Status: AC
  Administered 2014-10-03: 500 mL via INTRAVENOUS

## 2014-10-03 MED ORDER — TORSEMIDE 20 MG PO TABS
20.0000 mg | ORAL_TABLET | Freq: Every day | ORAL | Status: DC
  Filled 2014-10-03: qty 1

## 2014-10-03 NOTE — Care Management Important Message (Signed)
Important Message  Patient Details  Name: [name] MRN: [id number] Date of Birth: 03-02-1940   Medicare Important Message Given:  Yes-fourth notification given    Bernadette Hoit 10/03/2014, 10:40 AM

## 2014-10-03 NOTE — Progress Notes (Signed)
Patient ID: Ashley [name], female   DOB: 1940-03-31, 74 y.o.   MRN: [id number]  Advanced Heart Failure Rounding Note  Primary Physician: Dr Karsten Fells Primary Cardiologist: Dr Armanda Magic previously. Primary HF: Bensimhon  Subjective:    09/24/14 Successfully extubated after weaning off NO. 09/25/14 Swan d/c  10/01/14 Milrinone d/c  10/02/14 Cordis removed. Coox 71.6% off milrinone  Feels good today.  No complaints currently.    Out 0.7L and down 2 lbs on 40 mg torsemide daily.    Objective:   Weight Range: 163 lb 11.2 oz (74.254 kg) Body mass index is 29.01 kg/(m^2).   Vital Signs:   Temp:  [98.1 F (36.7 C)-98.2 F (36.8 C)] 98.2 F (36.8 C) (09/23 0419) Pulse Rate:  [85-95] 85 (09/23 0419) Resp:  [18] 18 (09/23 0419) BP: (120-149)/(72-81) 131/75 mmHg (09/23 0500) SpO2:  [96 %-99 %] 96 % (09/23 0419) Weight:  [163 lb 11.2 oz (74.254 kg)] 163 lb 11.2 oz (74.254 kg) (09/23 0419) Last BM Date: 10/03/14  Weight change: Filed Weights   10/01/14 0911 10/02/14 0420 10/03/14 0419  Weight: 169 lb 6.4 oz (76.839 kg) 164 lb 11.2 oz (74.707 kg) 163 lb 11.2 oz (74.254 kg)    Intake/Output:   Intake/Output Summary (Last 24 hours) at 10/03/14 0802 Last data filed at 10/02/14 2103  Gross per 24 hour  Intake    360 ml  Output   1050 ml  Net   -690 ml     Physical Exam: General: NAD, in recliner HEENT: Normal Neck: supple. JVP 8 cm. Carotids 2+ bilat; no bruits. No lymphadenopathy or thyromegaly . R neck dressing clean, dry, and intact from cordis removal.  Cor: PMI nondisplaced. Irregularly irregular. 2/6 TR Lungs: CTA Abdomen: soft, NT, ND. No HSM. No bruits or masses noted. +BS Extremities: no cyanosis, clubbing, rash. TED hose in place.  Trace ankle edema. GU: Foley Neuro: Moves all 4 extremities without difficulty. No focal deficits. Affect pleasant   Telemetry: Afib 80-90s  Labs: CBC  Recent Labs  10/01/14 0305 10/03/14 0512  WBC 8.9 7.4  HGB 9.8*  9.7*  HCT 31.0* 30.4*  MCV 92.5 95.3  PLT 320 370   Basic Metabolic Panel  Recent Labs  10/02/14 1000 10/03/14 0512  NA 131* 135  K 3.6 3.8  CL 81* 83*  CO2 42* 42*  GLUCOSE 128* 104*  BUN 18 20  CALCIUM 9.0 9.1   Liver Function Tests No results for input(s): AST, ALT, ALKPHOS, BILITOT, PROT, ALBUMIN in the last 72 hours. No results for input(s): LIPASE, AMYLASE in the last 72 hours. Cardiac Enzymes No results for input(s): CKTOTAL, CKMB, CKMBINDEX, TROPONINI in the last 72 hours.  BNP: BNP (last 3 results) No results for input(s): BNP in the last 8760 hours.  ProBNP (last 3 results) No results for input(s): PROBNP in the last 8760 hours.   D-Dimer No results for input(s): DDIMER in the last 72 hours. Hemoglobin A1C No results for input(s): HGBA1C in the last 72 hours. Fasting Lipid Panel No results for input(s): CHOL, HDL, LDLCALC, TRIG, CHOLHDL, LDLDIRECT in the last 72 hours. Thyroid Function Tests No results for input(s): TSH, T4TOTAL, T3FREE, THYROIDAB in the last 72 hours.  Invalid input(s): FREET3  Other results:     Imaging/Studies:  Dg Chest 2 View  10/01/2014   CLINICAL DATA:  Shortness of breath.  Weakness.  EXAM: CHEST  2 VIEW  COMPARISON:  09/30/2014  FINDINGS: Right IJ line tip: SVC. Prosthetic cardiac  valves. Atherosclerotic aortic arch. Moderate enlargement of the cardiopericardial silhouette.  Small amount of fluid in the minor fissure on the right. Scarring or atelectasis in the right upper lobe. Trace bilateral pleural effusions with blunting of the posterior costophrenic angles. Epicardial pacer leads. Currently no overt edema.  IMPRESSION: 1. Moderate enlargement of the cardiopericardial silhouette, with trace bilateral pleural effusions but no current overt edema. 2. Scarring or atelectasis in the right upper lobe.   Electronically Signed   By: Gaylyn Rong M.D.   On: 10/01/2014 09:36   Dg Chest Port 1 View  10/01/2014   CLINICAL  DATA:  Left-sided PICC placement.  EXAM: PORTABLE CHEST - 1 VIEW  COMPARISON:  10/01/2014  FINDINGS: Right IJ central line tip overlies superior vena cava. Left-sided PICC line has been placed, tip overlying the lower superior vena cava.  Heart is enlarged. Status post median sternotomy and valve replacement. There is subsegmental atelectasis at the left lung base and right perihilar region. Improved aeration at the right lung base. No pneumothorax. No pulmonary edema.  IMPRESSION: 1. Interval placement of left-sided PICC line, tip overlying the level of the lower superior vena cava. 2. Improved aeration at the right lung base.  No pneumothorax.   Electronically Signed   By: Norva Pavlov M.D.   On: 10/01/2014 16:10    Latest Echo  Latest Cath   Medications:     Scheduled Medications: . acidophilus  1 capsule Oral BID  . antiseptic oral rinse  7 mL Mouth Rinse BID  . aspirin EC  81 mg Oral Daily  . bisacodyl  10 mg Oral Daily   Or  . bisacodyl  10 mg Rectal Daily  . cephALEXin  500 mg Oral 3 times per day  . digoxin  0.125 mg Intravenous Daily  . docusate sodium  200 mg Oral Daily  . enoxaparin (LOVENOX) injection  30 mg Subcutaneous Q24H  . Influenza vac split quadrivalent PF  0.5 mL Intramuscular Tomorrow-1000  . insulin aspart  0-24 Units Subcutaneous 6 times per day  . metoCLOPramide (REGLAN) injection  10 mg Intravenous 4 times per day  . metoprolol succinate  37.5 mg Oral BID  . pantoprazole  40 mg Oral Daily  . potassium chloride  40 mEq Oral Daily  . sildenafil  40 mg Oral TID  . sodium chloride  3 mL Intravenous Q12H  . spironolactone  12.5 mg Oral Daily  . torsemide  40 mg Oral Daily    Infusions: . sodium chloride 20 mL/hr at 09/25/14 1000  . sodium chloride    . sodium chloride 20 mL/hr at 09/29/14 1800  . lactated ringers Stopped (09/23/14 2300)    PRN Medications: sodium chloride, diphenhydrAMINE, Gerhardt's butt cream, metoprolol, midazolam, ondansetron  (ZOFRAN) IV, oxyCODONE, promethazine, sodium chloride, sodium chloride, traMADol   Assessment/Plan   1. 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 H/o AVR (bioprosthesis) and MV ring 2012 3. 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  4. AKI 5. Persistent atrial fibrillation with RVR.  6. Acute respiratory failure: Resolved.  7. Pulmonary arterial hypertension.   Volume status stable. Continue 40 mg Torsemide daily.  Consider adding back her lisinopril with stable BP.   At baseline weight of 163 per pt. Down > 30 lbs from highest weight this admission.  Continue Revatio at 40 mg TID. No need  for prior auth per CM, Will be $18 a month for pt.   Continue digoxin + Toprol XL for rate control. Toprol increased to 37.5 BID 9/20. Amio drip d/c'd 9/19. Goal HR currently < 125 with diuresis.   Anticoagulation per Dr Donata Clay due to aortic hematoma on admit.  On K supp and spironolactone 12.5.  Length of Stay: [address] [place], [place] 8:02 AM 10/03/2014  Advanced Heart Failure Team Pager [phone] (M-F; 7a - 4p)  Please contact [facility] for night-coverage after hours (4p -7a ) and weekends on [email or web address]  Patient seen and examined with Otilio Saber, PA-C. We discussed all aspects of the encounter. I agree with the assessment and plan as stated above.   Overall doing well but she is dry. We gave NS 500cc bolus. Hold diuretics. Consider restarting home lasix in 1-2 days. Should be ready for d/c over the weekend.   Bensimhon, Daniel,MD 1:13 PM

## 2014-10-03 NOTE — Progress Notes (Signed)
Physical Therapy Treatment Patient Details Name: CAMRIN LAPRE MRN: [id number] DOB: 01-Mar-1940 Today's Date: 10/03/2014    History of Present Illness Adm with chest pain 9/10. found to have 7 cm pseudoaneurysm of ascending aorta; 9/13 re-do sternotomy for aorta repair; extubated 9/14 PMHx- bil shoulder surgery, C5-6 fusion, AVR and MVR 2012    PT Comments    Pt limited by orthostasis while performing balance exercises. Unable to progress to ambulation (See vitals flow sheet). PA present and aware; RN/NT in to assist with toileting needs and aware.   Follow Up Recommendations  SNF     Equipment Recommendations  None recommended by PT    Recommendations for Other Services OT consult     Precautions / Restrictions Precautions Precautions: Sternal;Fall Precaution Comments: Patient able to recall precautions, however requires cues to maintain precautions with functional mobility.  Restrictions Weight Bearing Restrictions: No (sternal precautions)    Mobility  Bed Mobility               General bed mobility comments: nearly up to EOB on arrival, pulling with UEs on bedrail  Transfers Overall transfer level: Needs assistance Equipment used: Rolling walker (2 wheeled) Transfers: Sit to/from [employer] Sit to Stand: Min guard Stand pivot transfers: Min assist       General transfer comment: continues to require vc for sequencing/set up; close guard to stand (no LOB, +dizzy); hands-on min assist to pivot to commode  Ambulation/Gait     Assistive device: Rolling walker (2 wheeled)   Gait velocity: decr   General Gait Details: unable due to dizziness and orthostasis; stood for balance exercises with pt becoming progressively dizzy and requested to sit; BP taken (see flowsheet); LE exercises completed; stood again and took standing BP (+orhtostasis); returned to sitting; assisted to [facility] and then NT/RN arrived and took over   Praxair Mobility    Modified Rankin (Stroke Patients Only)       Balance Overall balance assessment: Needs assistance   Sitting balance-Leahy Scale: Fair     Standing balance support: Bilateral upper extremity supported Standing balance-Leahy Scale: Poor Standing balance comment: did not feel safe letting go of RW due to dizziness                    Cognition Arousal/Alertness: Awake/alert Behavior During Therapy: Flat affect Overall Cognitive Status: Impaired/Different from baseline Area of Impairment: Awareness;Problem solving;Attention;Safety/judgement Orientation Level: Place;Situation Current Attention Level: Sustained (difficulty attending when 2 people directing) Memory: Decreased recall of precautions   Safety/Judgement: Decreased awareness of safety;Decreased awareness of deficits Awareness: Intellectual Problem Solving: Slow processing;Requires verbal cues;Difficulty sequencing;Requires tactile cues General Comments: even after significantly dizzy and orthostatic with standing x 2, she wanted to go for a walk    Exercises General Exercises - Lower Extremity Ankle Circles/Pumps: AROM;Both;10 reps Long Arc Quad: AROM;Both;10 reps Hip Flexion/Marching: AROM;Both;10 reps;Seated;Standing Heel Raises: AROM;Both;5 reps;Standing Mini-Sqauts: AROM;Both;10 reps    General Comments        Pertinent Vitals/Pain Pain Assessment: 0-10 Pain Score: 8  Pain Location: sternum Pain Descriptors / Indicators: Aching Pain Intervention(s): Limited activity within patient's tolerance;Monitored during session;Repositioned;Patient requesting pain meds-RN notified    Home Living                      Prior Function            PT Goals (current goals can  now be found in the care plan section) Acute Rehab PT Goals Patient Stated Goal: be able to live alone again Time For Goal Achievement: 07-Oct-2014 Progress towards PT goals: Not progressing toward goals  - comment    Frequency  Min 3X/week    PT Plan Discharge plan needs to be updated    Co-evaluation             End of Session Equipment Utilized During Treatment: Gait belt;Oxygen Activity Tolerance: Treatment limited secondary to medical complications (Comment) (orthostasis) Patient left: Other (comment) (with NT on [facility])     Time: 4540-9811 PT Time Calculation (min) (ACUTE ONLY): 35 min  Charges:  $Therapeutic Activity: 23-37 mins                    G Codes:      SASSER,LYNN 2014/10/07, 9:33 AM Pager [phone]

## 2014-10-03 NOTE — Progress Notes (Addendum)
      [address]       [name] [address]             [phone]      10 Days Post-Op Procedure(s) (LRB): REDO STERNOTOMY (N/A) REPAIR OF ASCENDING AORTIC PSEUDOANEURYSM (N/A) TRANSESOPHAGEAL ECHOCARDIOGRAM (TEE) (N/A)   Subjective:  [name] is participating with PT this morning.  She is getting dizzy upon standing and BP readings obtained indicate possible orthostasis.  Objective: Vital signs in last 24 hours: Temp:  [98.1 F (36.7 C)-98.2 F (36.8 C)] 98.2 F (36.8 C) (09/23 0419) Pulse Rate:  [85-95] 85 (09/23 0419) Cardiac Rhythm:  [-] Atrial fibrillation (09/23 0700) Resp:  [18] 18 (09/23 0419) BP: (120-149)/(72-81) 131/75 mmHg (09/23 0500) SpO2:  [96 %-99 %] 96 % (09/23 0419) Weight:  [163 lb 11.2 oz (74.254 kg)] 163 lb 11.2 oz (74.254 kg) (09/23 0419)  Intake/Output from previous day: 09/22 0701 - 09/23 0700 In: 360 [P.O.:360] Out: 1050 [Urine:1050]  General appearance: alert, cooperative and no distress Heart: regular rate and rhythm Lungs: clear to auscultation bilaterally Abdomen: soft, non-tender; bowel sounds normal; no masses,  no organomegaly Extremities: edema trace Wound: clean and dry  Lab Results:  Recent Labs  10/01/14 0305 10/03/14 0512  WBC 8.9 7.4  HGB 9.8* 9.7*  HCT 31.0* 30.4*  PLT 320 370   BMET:  Recent Labs  10/02/14 1000 10/03/14 0512  NA 131* 135  K 3.6 3.8  CL 81* 83*  CO2 42* 42*  GLUCOSE 128* 104*  BUN 18 20  CREATININE 1.37* 1.48*  CALCIUM 9.0 9.1    PT/INR: No results for input(s): LABPROT, INR in the last 72 hours. ABG    Component Value Date/Time   PHART 7.368 09/24/2014 1853   HCO3 20.9 09/24/2014 1853   TCO2 34 09/29/2014 1822   ACIDBASEDEF 4.0* 09/24/2014 1853   O2SAT 71.6 10/02/2014 0502   CBG (last 3)   Recent Labs  10/02/14 2020 10/02/14 2348 10/03/14 0416  GLUCAP 126* 102* 100*    Assessment/Plan: S/P Procedure(s) (LRB): REDO STERNOTOMY (N/A) REPAIR OF ASCENDING  AORTIC PSEUDOANEURYSM (N/A) TRANSESOPHAGEAL ECHOCARDIOGRAM (TEE) (N/A)  1. CV- having issues with hypotension this morning- on Digoxin, Lopressor, and Revatio 2. Pulm- wean oxygen as tolerated 3. Renal- creatinine stable, on Aldactone and Demedex 4. DM- cbgs controlled,  5. Deconditioning- continue PT/OT, needs SNF at discharge 6. Dispo- patient with orthostatic changes today, will speak with HF team in regards to diuretics and possible adjustment in medication   LOS: 13 days    [name], [name] 10/03/2014  Patient continues to progress but she appears to be somewhat dry today and diuretics have been cut back Surgical incisions healing well Patient should be able to be discharged to skilled nursing facility Sunday-Monday  patient examined and medical record reviewed,agree with above note. [name] 10/03/2014

## 2014-10-03 NOTE — Discharge Summary (Signed)
Physician Discharge Summary  Patient ID: Ashley [name] MRN: [id number] DOB/AGE: [birth year] 74 y.o.  Admit date: 09/20/2014 Discharge date: 10/06/2014  Admission Diagnoses:  Patient Active Problem List   Diagnosis Date Noted  . Acute cor pulmonale   . PAH (pulmonary artery hypertension)   . Aortic aneurysm, including pseudoaneurysm 09/20/2014  . Hiatal hernia   . Osteopenia   . PURE HYPERCHOLESTEROLEMIA 10/21/2009  . RHEUMATIC AORTIC STENOSIS 10/21/2009  . ESSENTIAL HYPERTENSION, BENIGN 10/21/2009  . ATRIAL FIBRILLATION 10/21/2009  . ESOPHAGEAL REFLUX 10/21/2009  . GENERALIZED OSTEOARTHROSIS UNSPECIFIED SITE 10/21/2009   Discharge Diagnoses:   Patient Active Problem List   Diagnosis Date Noted  . Acute cor pulmonale   . PAH (pulmonary artery hypertension)   . Aortic aneurysm, including pseudoaneurysm 09/20/2014  . Hiatal hernia   . Osteopenia   . PURE HYPERCHOLESTEROLEMIA 10/21/2009  . RHEUMATIC AORTIC STENOSIS 10/21/2009  . ESSENTIAL HYPERTENSION, BENIGN 10/21/2009  . ATRIAL FIBRILLATION 10/21/2009  . ESOPHAGEAL REFLUX 10/21/2009  . GENERALIZED OSTEOARTHROSIS UNSPECIFIED SITE 10/21/2009   Discharged Condition: good  History of Present Illness:  Ashley [name] is a 74 yo white female with known history of HTN, Dyslipidemia, PAF on Coumadin and valvular heart disease.  She underwent Mitral Valve repair, MAZE, and AVR with a 19 mm Pericardial Valve by Dr. Donata [name] on 04/2010.  She had a complicated surgery and ultimately due to edema had to be left open at the end of the surgery.  She was able to be closed on POD #3.  She was here for about 2 weeks and was later discharged home.  She developed chest pain after discharge from the hospital which was unremarkable, other than a collection of fluid in the anterior mediastinum.  Since then she has done well.  However over the past several weeks the patient developed upper sternal chest pain and cough.  This had progressed and she  ultimately went to the local hospital and required admission.  She underwent cardiac catheterization without evidence of CAD.  She underwent CTA of the chest which showed a 7 cm Pseudoaneurysm in the anterior mediastinum anterior to the ascending aorta extending anterior to the sternum superiorly to the brachiocephalic vein.  It felt most of this was clot but there was a small amount of contrast extravasation from the aorta in the area of the previous patch.  The patient requested transfer to [name] for further care.    Hospital Course:   She was admitted to [name].  Her chest pain resolved with cough suppression.  She was evaluated by Dr. Donata [name] who felt she would benefit from surgical intervention.  She was taken to the operating room on 09/23/2014. She underwent redo sternotomy, right axillary cannulation, and repair of pseudoaneurysm of ascending aorta.  She tolerated the procedure and was taken to the SICU in stable condition.  During her stay in the SICU the patient was extubated the morning after surgery.  She was weaned off Nitric Oxide, Levophed, Epinephrine, and Milrinone drips.  Advanced Heart Failure team was consulted for assistance with management as patient has a long standing history of heart failure.  The patient developed rapid Atrial Fibrillation and was started on Amiodarone and Digoxin.  She was hypervolemic and was treated with IV diuresis.  Her chest tubes and arterial lines were removed without difficulty.  She developed a post operative Ileus.  This has slowly resolved.  She continued to make progress and was transferred to the stepdown unit in stable  condition.    She continues to progress.  She has been successfully weaned off Milrinone and Lasix drip.  She is optimized on heart failure medications.  She has developed some orthostatic changes and was treated with saline bolus with resolution of symptoms.  She is deconditioned but due to high out of pocket cost she  prefers to be discharged to a SNF instead of inpatient rehab.  She is ambulating with assistance.  She remains in rate controlled Atrial Fibrillation. She was started on low dose Coumadin 10/05/2014. PT and INR were monitored.  Her pacing wires have been removed.  She is tolerating a diet.  Her pre op HGA1C is 6.8. Per Dr. Donata [name], she will need to folow up with her medical doctor. Every other axillary staple will be removed on 09/26. Remaining staples will be removed 10/3. Should she continues to progress, we anticipate discharge to a SNF on 10/06/2014.           We ask the SNF to please do the following: 1. Please obtain vital signs at least one time daily 2.Please weigh the patient daily. If he or she continues to gain weight or develops lower extremity edema, contact the office at [phone]. 3. Ambulate patient at least three times daily and please use sternal precautions.  Consults: cardiology  Significant Diagnostic Studies:   CTA Chest  1. No evidence of pulmonary embolism. 2. Cardiomegaly, post aortic and mitral valve replacement with findings suggestive of mild pulmonary edema and small bilateral effusions. Further evaluation may be performed with a cardiac echo as clinically indicated. 3. Minimal mosaic attenuation of the lungs, more conspicuous in the bilateral lung bases, nonspecific but may be seen in the setting of airways disease.  Treatments: surgery:   1. Redo sternotomy. 2. Right axillary cannulation. 3. Repair of pseudoaneurysm of ascending aorta 4. Placement of femoral A-line for blood pressure monitoring.  Disposition: Stable and discharged to SNF  Discharge Medications  The patient has been discharged on:   1.Beta Blocker:  Yes [ x  ]                              No   [   ]                              If No, reason:  2.Ace Inhibitor/ARB: Yes [   ]                                     No  [ x   ]                                     If No,  reason:Elevated creatinine  3.Statin:   Yes [   ]                  No  [ x  ]                  If No, reason:No CAD  4.Marlowe KaysCarlyle [name]   ]                  No   [   ]  If No, reason:     Medication List    STOP taking these medications        amoxicillin 875 MG tablet  Commonly known as:  AMOXIL     carvedilol 25 MG tablet  Commonly known as:  COREG     HYDROcodone-acetaminophen 5-325 MG per tablet  Commonly known as:  NORCO/VICODIN     lisinopril 40 MG tablet  Commonly known as:  PRINIVIL,ZESTRIL      TAKE these medications        acetaminophen 325 MG tablet  Commonly known as:  TYLENOL  Take 650 mg by mouth every 6 (six) hours as needed (pain).     alendronate 70 MG tablet  Commonly known as:  FOSAMAX  Take 70 mg by mouth once a week. On Saturdays     aspirin 81 MG EC tablet  Take 1 tablet (81 mg total) by mouth daily.     CALCIUM + D3 PO  Take 1 tablet by mouth daily. Calcium 1200 mg     cyclobenzaprine 10 MG tablet  Commonly known as:  FLEXERIL  Take 10 mg by mouth 3 (three) times daily as needed.     ferrous sulfate 325 (65 FE) MG EC tablet  Take 325 mg by mouth 2 (two) times daily with a meal.     furosemide 40 MG tablet  Commonly known as:  LASIX  Take 40 mg by mouth See admin instructions. Take 1 tablet (40 mg) by mouth with breakfast and supper, may take an additional tablet (40 mg) at bedtime as needed for leg swelling     meclizine 25 MG tablet  Commonly known as:  ANTIVERT  Take 25 mg by mouth 3 (three) times daily as needed.     metoprolol succinate 50 MG 24 hr tablet  Commonly known as:  TOPROL-XL  Take 1 tablet (50 mg total) by mouth 2 (two) times daily. Take with or immediately following a meal.     montelukast 10 MG tablet  Commonly known as:  SINGULAIR  Take 10 mg by mouth daily with supper.     pantoprazole 40 MG tablet  Commonly known as:  PROTONIX  Take 40 mg by mouth daily.     potassium chloride SA 20 MEQ  tablet  Commonly known as:  K-DUR,KLOR-CON  Take 20 mEq by mouth daily with supper.     pravastatin 20 MG tablet  Commonly known as:  PRAVACHOL  Take 20 mg by mouth daily with supper.     sildenafil 20 MG tablet  Commonly known as:  REVATIO  Take 2 tablets (40 mg total) by mouth 3 (three) times daily.     spironolactone 25 MG tablet  Commonly known as:  ALDACTONE  Take 0.5 tablets (12.5 mg total) by mouth daily.     traMADol 50 MG tablet  Commonly known as:  ULTRAM  Take 1-2 tablets (50-100 mg total) by mouth every 4 (four) hours as needed for moderate pain.     warfarin 2.5 MG tablet  Commonly known as:  COUMADIN  Take 2.5-3.75 mg by mouth daily with supper. Take 1 tablet (2.5 mg) by mouth on 1st day, then take 1 1/2 tablets (3.75 mg) on 2nd day, then repeat       Follow-up Information    Follow up with Kerin Perna III, MD On 11/05/2014.   Specialty:  Cardiothoracic Surgery   Why:  Appointment is at 12:00   Contact information:  [address] [place] [address] [phone]       Follow up with [facility] On 11/05/2014.   Why:  Please get CXR at 11:30   Contact information:   [name]       Follow up with Arvilla Meres, MD On 10/13/2014.   Specialty:  Cardiology   Why:  at 1000 am. Please bring all of your medications to your visit.  Please call the office for patient parking code.   Contact information:   [address] [place] [address] [phone]       Follow up with SNF.   Why:  Please draw PT and INR on Wedenesday 10/08/2014 and call or fax results to Dr. Prescott Gum office      Signed: Lowella Dandy PA-C 10/06/2014, 8:29 AM

## 2014-10-03 NOTE — Progress Notes (Signed)
HF team received page concerning Ms Inda being orthostatic with SBP 115 -> 80s lying to sitting.  After discussing with MD, will give 500 cc NS bolus and hold torsemide for today.  Nurse aware.  Will start torsemide back tomorrow at 20 mg daily.    Casimiro Needle [address]" [place], PA-C 10/03/2014 9:19 AM

## 2014-10-03 NOTE — Progress Notes (Signed)
CSW spoke with pt daughter this morning- she would like to attempt to get pt into [facility] in [place]- CSW faxed referral to facility at 10am- bed offer pending.  Pt dtr stated that [facility] would be next choice- CSW confirmed bed availability for Monday if [place] facility is unable to admit.  CSW will continue to follow.  Merlyn Lot, LCSWA Clinical Social Worker [phone]

## 2014-10-03 NOTE — Progress Notes (Signed)
Occupational Therapy Treatment Patient Details Name: Ashley [name] MRN: [id number] DOB: [birth year] Today's Date: 10/03/2014    History of present illness Adm with chest pain 9/10. found to have 7 cm pseudoaneurysm of ascending aorta; 9/13 re-do sternotomy for aorta repair; extubated 9/14 PMHx- bil shoulder surgery, C5-6 fusion, AVR and MVR 2012   OT comments  Patient making slow progress, will continue plan of care for now. Pt limited today by lethargy. Pt limited by orthostasis with PT earlier. BP=111/76 prior to activity (seated in recliner) and 117/64 after activity (supine in bed). D/C plan has changed > SNF, believe this is appropriate prior to patient discharging>home alone.    Follow Up Recommendations  SNF;Supervision/Assistance - 24 hour    Equipment Recommendations  Other (comment) (TBD)    Recommendations for Other Services  None at this time   Precautions / Restrictions Precautions Precautions: Sternal;Fall Restrictions Weight Bearing Restrictions: No    Mobility Bed Mobility Overal bed mobility: Needs Assistance Bed Mobility: Sit to Supine Sit to supine: Mod assist   General bed mobility comments: Mod assist to assist with BLEs for sit>supine  Transfers Overall transfer level: Needs assistance Equipment used: None Transfers: Sit to/from Stand Sit to Stand: Min guard General transfer comment: Min guard for sit>stand from recliner and stand>sit EOB. Pt with increased lethargy.     Balance Overall balance assessment: Needs assistance Sitting-balance support: No upper extremity supported;Feet supported Sitting balance-Leahy Scale: Fair     Standing balance support: No upper extremity supported;During functional activity Standing balance-Leahy Scale: Poor    ADL General ADL Comments: Pt limited by lethargy this session. Upon entering room pt in recliner and stated she was sleepy. BP=111/76, pt stood and ambulated around bed > EOB>supine; BP=114/64. Pt with  orthostasis this am and limited with mobility. Encouraged pt to rest, as she was barely able to keep eyes open. Notified NT of patient's whereabouts.      Cognition   Behavior During Therapy: Flat affect Overall Cognitive Status: Impaired/Different from baseline Area of Impairment: Awareness;Problem solving;Attention;Safety/judgement   Current Attention Level: Sustained    Following Commands: Follows one step commands with increased time Safety/Judgement: Decreased awareness of safety;Decreased awareness of deficits Awareness: Intellectual Problem Solving: Slow processing;Requires verbal cues;Difficulty sequencing;Requires tactile cues General Comments: Pt asking to go for a walk even though she was lethargic and with difficulty keeping eyes open                 Pertinent Vitals/ Pain       Pain Assessment: Faces Faces Pain Scale: No hurt   Frequency Min 2X/week     Progress Toward Goals  OT Goals(current goals can now befound in the care plan section)  Progress towards OT goals: Progressing toward goals     Plan Discharge plan needs to be updated       End of Session Equipment Utilized During Treatment: Oxygen   Activity Tolerance Patient limited by lethargy   Patient Left in bed;with call bell/phone within reach;with chair alarm set   Nurse Communication Other (comment) (pt back to bed)     Time: 1351-1405 OT Time Calculation (min): 14 min  Charges: OT General Charges $OT Visit: 1 Procedure OT Treatments $Therapeutic Activity: 8-22 mins  CLAY,PATRICIA , MS, OTR/L, CLT Pager: [phone]  10/03/2014, 2:25 PM

## 2014-10-04 DIAGNOSIS — I482 Chronic atrial fibrillation: Secondary | ICD-10-CM

## 2014-10-04 LAB — GLUCOSE, CAPILLARY
GLUCOSE-CAPILLARY: 104 mg/dL — AB (ref 65–99)
Glucose-Capillary: 98 mg/dL (ref 65–99)
Glucose-Capillary: 118 mg/dL — ABNORMAL HIGH (ref 65–99)
Glucose-Capillary: 143 mg/dL — ABNORMAL HIGH (ref 65–99)
Glucose-Capillary: 107 mg/dL — ABNORMAL HIGH (ref 65–99)

## 2014-10-04 MED ORDER — METOCLOPRAMIDE HCL 10 MG PO TABS: 10.0000 mg | ORAL_TABLET | Freq: Three times a day (TID) | ORAL | Status: DC | PRN

## 2014-10-04 MED ORDER — METOPROLOL SUCCINATE ER 50 MG PO TB24
50.0000 mg | ORAL_TABLET | Freq: Two times a day (BID) | ORAL | Status: DC
  Administered 2014-10-04 – 2014-10-06 (×4): 50 mg via ORAL
  Filled 2014-10-04 (×4): qty 1

## 2014-10-04 NOTE — Progress Notes (Signed)
CARDIAC REHAB PHASE I   PRE:  Rate/Rhythm: 90 afib    BP: sitting 142/76    SaO2: 88 RA, 96 1L  MODE:  Ambulation: 350 ft   POST:  Rate/Rhythm: 100 afib    BP: sitting 107/88     SaO2: 96 1L  Pt in BR upon my arrival. SaO2 85-88 RA. Ambulated 350 ft with RW and 1L. Felt considerably weak at times. To bed after walk, SaO2 96 on 1L. Left on 1L in bed. Pt only inspiring 250-500 mL on IS. Encouraged more use and 2 more walks today. Overall doing well. [id number]   Elissa Lovett [place] CES, ACSM 10/04/2014 10:50 AM

## 2014-10-04 NOTE — Progress Notes (Addendum)
      [address]       [place] [address]             [phone]        11 Days Post-Op Procedure(s) (LRB): REDO STERNOTOMY (N/A) REPAIR OF ASCENDING AORTIC PSEUDOANEURYSM (N/A) TRANSESOPHAGEAL ECHOCARDIOGRAM (TEE) (N/A)  Subjective: Patient denies dizziness this am. She had a bowel movement earlier today.  Objective: Vital signs in last 24 hours: Temp:  [97.4 F (36.3 C)-97.6 F (36.4 C)] 97.6 F (36.4 C) (09/24 0454) Pulse Rate:  [81-99] 81 (09/24 0755) Cardiac Rhythm:  [-] Atrial fibrillation (09/24 0701) Resp:  [17-18] 18 (09/24 0454) BP: (89-127)/(57-82) 126/82 mmHg (09/24 0755) SpO2:  [95 %-98 %] 95 % (09/24 0454) Weight:  [164 lb 9.6 oz (74.662 kg)] 164 lb 9.6 oz (74.662 kg) (09/24 0454)  Pre op weight 79 kg Current Weight  10/04/14 164 lb 9.6 oz (74.662 kg)      Intake/Output from previous day: 09/23 0701 - 09/24 0700 In: 120 [P.O.:120] Out: 500 [Urine:500]   Physical Exam:  Cardiovascular: IRRR IRRR Pulmonary: Clear to auscultation bilaterally; no rales, wheezes, or rhonchi. Abdomen: Soft, non tender, bowel sounds present. Extremities: Trace bilateral lower extremity edema. Wounds: Clean and dry.  No erythema or signs of infection.  Lab Results: CBC: Recent Labs  10/03/14 0512  WBC 7.4  HGB 9.7*  HCT 30.4*  PLT 370   BMET:  Recent Labs  10/02/14 1000 10/03/14 0512  NA 131* 135  K 3.6 3.8  CL 81* 83*  CO2 42* 42*  GLUCOSE 128* 104*  BUN 18 20  CREATININE 1.37* 1.48*  CALCIUM 9.0 9.1    PT/INR:  Lab Results  Component Value Date   INR 1.28 09/20/2014   INR 2.23* 10/15/2010   INR 2.49* 10/14/2010   ABG:  INR: Will add last result for INR, ABG once components are confirmed Will add last 4 CBG results once components are confirmed  Assessment/Plan:  1. CV - Previous ortho stasis. A fib in the 90's this am. On Toprol XL 37.5 mg bid, Sildenafil 40 mg tid, Digoxin 0.125 mg IV, and Spironolactone 12.5 mg  bid. 2.  Pulmonary - Encourage incentive spirometer 3. Volume Overload - On Torsemide 20 mg daily. This was held yesterday and she was given a bolus of fluid for hypotension. 4.  Acute blood loss anemia - Last H and H stable at 9.7 and 30.4 5.DM-CBGs 150/98/104. Pre op HGA1C 6.8. Will need medical follow up after discharge 6.Deconditioning-continue with PT/OT 7. ID-on Keflex 8. Last creatinine was 1.48. Will recheck in am 9. Will discuss with Dr. Donata Clay when to remove every other axillary stapel 10. Will need SNF when ready for discharge-Monday  ZIMMERMAN,DONIELLE MPA-C 10/04/2014,8:00 AM  Patient making slow progress and will be ready for discharge to skilled nursing facility in [place] on Monday Patient requests [facility] in [place] hold Coumadin for her A. fib until she is seen back in the office because of risk of rebleeding from her diseased aorta. patient examined and medical record reviewed,agree with above note. [name] 10/04/2014

## 2014-10-04 NOTE — Progress Notes (Addendum)
Patient ID: Ashley [name], female   DOB: 03-Jul-1940, 74 y.o.   MRN: [id number]  Advanced Heart Failure Rounding Note  Primary Physician: Dr Karsten Fells Primary Cardiologist: Dr Armanda Magic previously. Primary HF: Bensimhon  Subjective:     Diuretics held yesterday and given 500cc NS due to overdiuresis.  Feels better today.  No complaints currently. Weight up a pound.     Objective:   Weight Range: 74.662 kg (164 lb 9.6 oz) Body mass index is 29.16 kg/(m^2).   Vital Signs:   Temp:  [97.4 F (36.3 C)-97.6 F (36.4 C)] 97.6 F (36.4 C) (09/24 0454) Pulse Rate:  [81-93] 81 (09/24 0755) Resp:  [17-18] 18 (09/24 0454) BP: (121-127)/(57-82) 126/82 mmHg (09/24 0755) SpO2:  [95 %-98 %] 95 % (09/24 0454) Weight:  [74.662 kg (164 lb 9.6 oz)] 74.662 kg (164 lb 9.6 oz) (09/24 0454) Last BM Date: 10/04/14  Weight change: Filed Weights   10/02/14 0420 10/03/14 0419 10/04/14 0454  Weight: 74.707 kg (164 lb 11.2 oz) 74.254 kg (163 lb 11.2 oz) 74.662 kg (164 lb 9.6 oz)    Intake/Output:   Intake/Output Summary (Last 24 hours) at 10/04/14 0900 Last data filed at 10/04/14 0750  Gross per 24 hour  Intake    240 ml  Output    500 ml  Net   -260 ml     Physical Exam: General: NAD, in bed HEENT: Normal Neck: supple. JVP 7-8 cm. Carotids 2+ bilat; no bruits. No lymphadenopathy or thyromegaly .  Cor: PMI nondisplaced. Irregularly irregular. 2/6 TR Lungs: CTA Abdomen: soft, NT, ND. No HSM. No bruits or masses noted. +BS Extremities: no cyanosis, clubbing, rash. TED hose in place.  No edema. Neuro: Moves all 4 extremities without difficulty. No focal deficits. Affect pleasant   Telemetry: Afib 80-90s  Labs: CBC  Recent Labs  10/03/14 0512  WBC 7.4  HGB 9.7*  HCT 30.4*  MCV 95.3  PLT 370   Basic Metabolic Panel  Recent Labs  10/02/14 1000 10/03/14 0512  NA 131* 135  K 3.6 3.8  CL 81* 83*  CO2 42* 42*  GLUCOSE 128* 104*  BUN 18 20  CALCIUM 9.0 9.1   Liver  Function Tests No results for input(s): AST, ALT, ALKPHOS, BILITOT, PROT, ALBUMIN in the last 72 hours. No results for input(s): LIPASE, AMYLASE in the last 72 hours. Cardiac Enzymes No results for input(s): CKTOTAL, CKMB, CKMBINDEX, TROPONINI in the last 72 hours.  BNP: BNP (last 3 results) No results for input(s): BNP in the last 8760 hours.  ProBNP (last 3 results) No results for input(s): PROBNP in the last 8760 hours.   D-Dimer No results for input(s): DDIMER in the last 72 hours. Hemoglobin A1C No results for input(s): HGBA1C in the last 72 hours. Fasting Lipid Panel No results for input(s): CHOL, HDL, LDLCALC, TRIG, CHOLHDL, LDLDIRECT in the last 72 hours. Thyroid Function Tests No results for input(s): TSH, T4TOTAL, T3FREE, THYROIDAB in the last 72 hours.  Invalid input(s): FREET3  Other results:     Imaging/Studies:  No results found.  Latest Echo  Latest Cath   Medications:     Scheduled Medications: . acidophilus  1 capsule Oral BID  . antiseptic oral rinse  7 mL Mouth Rinse BID  . aspirin EC  81 mg Oral Daily  . bisacodyl  10 mg Oral Daily   Or  . bisacodyl  10 mg Rectal Daily  . cephALEXin  500 mg Oral 3 times per  day  . digoxin  0.125 mg Intravenous Daily  . docusate sodium  200 mg Oral Daily  . enoxaparin (LOVENOX) injection  30 mg Subcutaneous Q24H  . Influenza vac split quadrivalent PF  0.5 mL Intramuscular Tomorrow-1000  . insulin aspart  0-24 Units Subcutaneous 6 times per day  . metoCLOPramide (REGLAN) injection  10 mg Intravenous 4 times per day  . metoprolol succinate  37.5 mg Oral BID  . pantoprazole  40 mg Oral Daily  . potassium chloride  40 mEq Oral Daily  . sildenafil  40 mg Oral TID  . sodium chloride  3 mL Intravenous Q12H  . spironolactone  12.5 mg Oral Daily  . torsemide  20 mg Oral Daily    Infusions: . sodium chloride 20 mL/hr at 09/25/14 1000  . sodium chloride    . sodium chloride 20 mL/hr at 09/29/14 1800  .  lactated ringers Stopped (09/23/14 2300)    PRN Medications: sodium chloride, diphenhydrAMINE, Gerhardt's butt cream, metoprolol, midazolam, ondansetron (ZOFRAN) IV, oxyCODONE, promethazine, sodium chloride, sodium chloride, traMADol   Assessment/Plan   1. 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 H/o AVR (bioprosthesis) and MV ring 2012 3. 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  4. AKI 5. Persistent atrial fibrillation  6. Acute respiratory failure: Resolved.  7. Pulmonary arterial hypertension.   Overall doing well. Volume status still on dry side. Continue to hold diuretics. Consider restarting home lasix in 1-2 days. Should be ready for d/c on Monday. Will stop digoxin. Increase Toprol.Will discuss anticoagulation with Dr. Donata Clay.   Length of Stay: 14  Arvilla Meres, MD  9:00 AM 10/04/2014  Advanced Heart Failure Team Pager [phone] (M-F; 7a - 4p)  Please contact [facility] for night-coverage after hours (4p -7a ) and weekends on [email or web address]

## 2014-10-04 NOTE — Progress Notes (Signed)
Patient ambulated about 300 ft. Using a rolling walker on RA, patient tolerated well, took one break during the walk. RN assessed vitals, saO2 91%, BP 127/78. No complaints at this time, pt. Back in bed, call light within reach.   Toya Smothers, RN

## 2014-10-04 NOTE — Progress Notes (Signed)
Pt ambulated 150 feet with RW and 1 L O2.  Pt stated she did not want to alarm RN, Pt states she has history of silent migraines that involve seeing black spots and fluttering in her peripheral vision.  Pt requested to be transported back to room in [facility].  WC retrieved and Pt returned to room.  Pt denies dizziness or headache.  Does not appear to be in any distress.  Pt states she felt better once back in room.  Will continue to monitor.

## 2014-10-05 LAB — BASIC METABOLIC PANEL WITH GFR
Anion gap: 9 (ref 5–15)
BUN: 20 mg/dL (ref 6–20)
CO2: 35 mmol/L — ABNORMAL HIGH (ref 22–32)
Calcium: 8.7 mg/dL — ABNORMAL LOW (ref 8.9–10.3)
Chloride: 91 mmol/L — ABNORMAL LOW (ref 101–111)
Creatinine, Ser: 1.56 mg/dL — ABNORMAL HIGH (ref 0.44–1.00)
GFR calc Af Amer: 37 mL/min — ABNORMAL LOW (ref >60)
GFR calc non Af Amer: 32 mL/min — ABNORMAL LOW (ref >60)
Glucose, Bld: 105 mg/dL — ABNORMAL HIGH (ref 65–99)
Potassium: 4.1 mmol/L (ref 3.5–5.1)
Sodium: 135 mmol/L (ref 135–145)

## 2014-10-05 LAB — GLUCOSE, CAPILLARY
Glucose-Capillary: 107 mg/dL — ABNORMAL HIGH (ref 65–99)
Glucose-Capillary: 109 mg/dL — ABNORMAL HIGH (ref 65–99)
Glucose-Capillary: 122 mg/dL — ABNORMAL HIGH (ref 65–99)
Glucose-Capillary: 125 mg/dL — ABNORMAL HIGH (ref 65–99)
Glucose-Capillary: 149 mg/dL — ABNORMAL HIGH (ref 65–99)

## 2014-10-05 MED ORDER — CEPHALEXIN 500 MG PO CAPS
500.0000 mg | ORAL_CAPSULE | Freq: Three times a day (TID) | ORAL | Status: AC
  Administered 2014-10-05 (×2): 500 mg via ORAL
  Filled 2014-10-05 (×2): qty 1

## 2014-10-05 MED ORDER — FUROSEMIDE 40 MG PO TABS
40.0000 mg | ORAL_TABLET | Freq: Every day | ORAL | Status: DC
  Administered 2014-10-05 – 2014-10-06 (×2): 40 mg via ORAL
  Filled 2014-10-05 (×2): qty 1

## 2014-10-05 MED ORDER — WARFARIN SODIUM 2.5 MG PO TABS: 2.5000 mg | ORAL_TABLET | Freq: Every day | ORAL | Status: DC

## 2014-10-05 MED ORDER — WARFARIN - PHARMACIST DOSING INPATIENT: Freq: Every day | Status: DC

## 2014-10-05 MED ORDER — WARFARIN SODIUM 2.5 MG PO TABS
2.5000 mg | ORAL_TABLET | Freq: Every day | ORAL | Status: DC
  Administered 2014-10-05: 2.5 mg via ORAL
  Filled 2014-10-05: qty 1

## 2014-10-05 NOTE — Discharge Instructions (Signed)
We ask the SNF to please do the following: 1. Please obtain vital signs at least one time daily 2.Please weigh the patient daily. If he or she continues to gain weight or develops lower extremity edema, contact the office at [phone]. 3. Ambulate patient at least three times daily and please use sternal precautions.     Aortic Pseudoaneurysm, Care After Refer to this sheet in the next few weeks. These instructions provide you with information on caring for yourself after your procedure. Your health care provider may also give you specific instructions. Your treatment has been planned according to current medical practices, but problems sometimes occur. Call your health care provider if you have any problems or questions after your procedure. HOME CARE INSTRUCTIONS   Take medicines only as directed by your health care provider.  If your health care provider has prescribed elastic stockings, wear them as directed.  Take frequent naps or rest often throughout the day.  Avoid lifting over 10 lbs (4.5 kg) or pushing or pulling things with your arms for 6-8 weeks or as directed by your health care provider.  Avoid driving or airplane travel for 4-6 weeks after surgery or as directed by your health care provider. If you are riding in a car for an extended period, stop every 1-2 hours to stretch your legs. Keep a record of your medicines and medical history with you when traveling.  Do not drive or operate heavy machinery while taking pain medicine. (narcotics).  Do not cross your legs.  Do not use any tobacco products including cigarettes, chewing tobacco, or electronic cigarettes. If you need help quitting, ask your health care provider.  Do not take baths, swim, or use a hot tub until your health care provider approves. Take showers once your health care provider approves. Pat incisions dry. Do not rub incisions with a washcloth or towel.  Avoid climbing stairs and using the handrail  to pull yourself up for the first 2-3 weeks after surgery.  Return to work as directed by your health care provider.  Drink enough fluid to keep your urine clear or pale yellow.  Do not strain to have a bowel movement. Eat high-fiber foods if you become constipated. You may also take a medicine to help you have a bowel movement (laxative) as directed by your health care provider.  Resume sexual activity as directed by your health care provider. Men should not use medicines for erectile dysfunction until their doctor says it isokay.  If you had a certain type of heart condition in the past, you may need to take antibiotic medicine before having dental work or surgery. Let your dentist and health care providers know if you had one or more of the following:  Previous endocarditis.  An artificial (prosthetic) heart valve.  Congenital heart disease. SEEK MEDICAL CARE IF:  You develop a skin rash.   You experience sudden changes in your weight.  You have a fever. SEEK IMMEDIATE MEDICAL CARE IF:   You develop chest pain that is not coming from your incision.  You have drainage (pus), redness, swelling, or pain at your incision site.   You develop shortness of breath or have difficulty breathing.   You have increased bleeding from your incision site.   You develop light-headedness.  MAKE SURE YOU:   Understand these directions.  Will watch your condition.  Will get help right away if you are not doing well or get worse. Document Released: 07/15/2004 Document Revised: 05/13/2013 Document  Reviewed: 10/11/2011 ExitCare Patient Information 2015 [place], [place]. This information is not intended to replace advice given to you by your health care provider. Make sure you discuss any questions you have with your health care provider.

## 2014-10-05 NOTE — Progress Notes (Signed)
10/05/2014 3:04 PM During assisting pt. To bathroom. Noted PICC line had already been taken out. Noted orders placed for PICC to be removed am 9/26. Pt. States IV nurse took it out earlier. Primary RN not notified of removal. Doree Fudge [facility] paged and made aware. Verbal order received ok to leave IV access out at this time and not to re-stick patient or restart IV at this time. Will continue to monitor patient.  Flippin, Blanchard Kelch

## 2014-10-05 NOTE — Progress Notes (Signed)
Updated clinicals faxed for [facility] for review in the morning.  Possible d/c anticipated for tomorrow if stable per MD.  Current d/c plan is SNF-  [facility] in [place] vs [facility].  Fl2 on chart for MD's signature.  Lorri Frederick. Jaci Lazier, [place] [phone]  (weekend coverage)

## 2014-10-05 NOTE — Progress Notes (Signed)
10/05/2014 3:58 PM Nursing note Pt. Ambulated 150 ft with RW, RN and on 2L [place]. Pt. Tolerated well. Encouraged one more walk this evening. Will continue to closely monitor patient. Kiauna Zywicki, Blanchard Kelch

## 2014-10-05 NOTE — Progress Notes (Signed)
10/05/2014 1350 EPW d/c per order and per protocol. Ends intact. Pt. Tolerated well. Advised BR x 1 HR. Call bell within reach. VS collected per protocol. Will continue to monitor patient.  Flippin, Blanchard Kelch

## 2014-10-05 NOTE — Progress Notes (Signed)
Utilization review completed.  

## 2014-10-05 NOTE — Progress Notes (Addendum)
      [address]       [place] [address]             [phone]        12 Days Post-Op Procedure(s) (LRB): REDO STERNOTOMY (N/A) REPAIR OF ASCENDING AORTIC PSEUDOANEURYSM (N/A) TRANSESOPHAGEAL ECHOCARDIOGRAM (TEE) (N/A)  Subjective: Patient had a silent migraine yesterday. No complaints this am.  Objective: Vital signs in last 24 hours: Temp:  [97.5 F (36.4 C)-98.2 F (36.8 C)] 98.2 F (36.8 C) (09/25 0409) Pulse Rate:  [78-88] 80 (09/25 0752) Cardiac Rhythm:  [-] Atrial fibrillation (09/25 0700) Resp:  [16-17] 16 (09/25 0409) BP: (117-131)/(71-89) 127/89 mmHg (09/25 0752) SpO2:  [95 %-98 %] 95 % (09/25 0409) Weight:  [165 lb 1.6 oz (74.889 kg)] 165 lb 1.6 oz (74.889 kg) (09/25 0404)  Pre op weight 79 kg Current Weight  10/05/14 165 lb 1.6 oz (74.889 kg)      Intake/Output from previous day: 09/24 0701 - 09/25 0700 In: 240 [P.O.:240] Out: -    Physical Exam:  Cardiovascular: IRRR  Pulmonary: Clear to auscultation bilaterally; no rales, wheezes, or rhonchi. Abdomen: Soft, non tender, bowel sounds present. Extremities: Trace bilateral lower extremity edema. Wounds: Clean and dry.  No erythema or signs of infection.  Lab Results: CBC:  Recent Labs  10/03/14 0512  WBC 7.4  HGB 9.7*  HCT 30.4*  PLT 370   BMET:   Recent Labs  10/03/14 0512 10/05/14 0529  NA 135 135  K 3.8 4.1  CL 83* 91*  CO2 42* 35*  GLUCOSE 104* 105*  BUN 20 20  CREATININE 1.48* 1.56*  CALCIUM 9.1 8.7*    PT/INR:  Lab Results  Component Value Date   INR 1.28 09/20/2014   INR 2.23* 10/15/2010   INR 2.49* 10/14/2010   ABG:  INR: Will add last result for INR, ABG once components are confirmed Will add last 4 CBG results once components are confirmed  Assessment/Plan:  1. CV - Previous ortho stasis. PAF with CVR. On Toprol XL 50 mg bid, Sildenafil 40 mg tid, and Spironolactone 12.5 mg daily.  Possible start low dose Coumadin. 2.  Pulmonary - On  1 liter of oxygen via [place]. Wean to room air. Encourage incentive spirometer 3. Volume Overload - On Torsemide 20 mg daily.  4.  Acute blood loss anemia - Last H and H stable at 9.7 and 30.4 5.DM-CBGs 107/109/107. Pre op HGA1C 6.8. Per Dr. Donata Clay, will stop accu checks and SS PRN. Will need medical follow up after discharge. 6.Deconditioning-continue with PT/OT 7. ID-on Keflex for sternal drainage. Sternal drainage has stopped. Will stop Keflex after today's dose 8. Creatinine increased from 1.48 to 1.56. On Torsemide and Spironolactone. Will defer to Dr. Gala Romney and check creatinine in am 9. Will remove every other axillary staple on Monday 10. Will need SNF when ready for discharge-hopefully Monday  ZIMMERMAN,DONIELLE MPA-C 10/05/2014,7:56 AM   Patient will be ready for discharge to skilled nursing facility in [facility] hopefully tomorrow Start low-dose I Coumadin for chronic A. fib with INR l with reduced  goal 1.5-2.0 because of recent ascending aortic pseudoaneurysm  patient examined and medical record reviewed,agree with above note. [name] 10/05/2014

## 2014-10-05 NOTE — Progress Notes (Signed)
Patient ID: KARRYN KOSINSKI, female   DOB: 02/26/1940, 74 y.o.   MRN: [id number]  Advanced Heart Failure Rounding Note  Primary Physician: Dr Karsten Fells Primary Cardiologist: Dr Armanda Magic previously. Primary HF: Jesi Jurgens  Subjective:     Feels fine. Weight drifting up. No dyspnea.   Objective:   Weight Range: 74.889 kg (165 lb 1.6 oz) Body mass index is 29.25 kg/(m^2).   Vital Signs:   Temp:  [97.5 F (36.4 C)-98.2 F (36.8 C)] 98.2 F (36.8 C) (09/25 0409) Pulse Rate:  [78-88] 80 (09/25 0752) Resp:  [16-17] 16 (09/25 0409) BP: (117-131)/(71-89) 127/89 mmHg (09/25 0752) SpO2:  [95 %-98 %] 95 % (09/25 0409) Weight:  [74.889 kg (165 lb 1.6 oz)] 74.889 kg (165 lb 1.6 oz) (09/25 0404) Last BM Date: 10/04/14  Weight change: Filed Weights   10/03/14 0419 10/04/14 0454 10/05/14 0404  Weight: 74.254 kg (163 lb 11.2 oz) 74.662 kg (164 lb 9.6 oz) 74.889 kg (165 lb 1.6 oz)    Intake/Output:   Intake/Output Summary (Last 24 hours) at 10/05/14 1044 Last data filed at 10/05/14 0845  Gross per 24 hour  Intake    240 ml  Output      0 ml  Net    240 ml     Physical Exam: General: NAD, in bed HEENT: Normal Neck: supple. JVP 7-8 cm. Carotids 2+ bilat; no bruits. No lymphadenopathy or thyromegaly .  Cor: PMI nondisplaced. Irregularly irregular. 2/6 TR Lungs: CTA Abdomen: soft, NT, ND. No HSM. No bruits or masses noted. +BS Extremities: no cyanosis, clubbing, rash. TED hose in place.  No edema. Neuro: Moves all 4 extremities without difficulty. No focal deficits. Affect pleasant   Telemetry: Afib 60-65  Labs: CBC  Recent Labs  10/03/14 0512  WBC 7.4  HGB 9.7*  HCT 30.4*  MCV 95.3  PLT 370   Basic Metabolic Panel  Recent Labs  10/03/14 0512 10/05/14 0529  NA 135 135  K 3.8 4.1  CL 83* 91*  CO2 42* 35*  GLUCOSE 104* 105*  BUN 20 20  CALCIUM 9.1 8.7*   Liver Function Tests No results for input(s): AST, ALT, ALKPHOS, BILITOT, PROT, ALBUMIN in the  last 72 hours. No results for input(s): LIPASE, AMYLASE in the last 72 hours. Cardiac Enzymes No results for input(s): CKTOTAL, CKMB, CKMBINDEX, TROPONINI in the last 72 hours.  BNP: BNP (last 3 results) No results for input(s): BNP in the last 8760 hours.  ProBNP (last 3 results) No results for input(s): PROBNP in the last 8760 hours.   D-Dimer No results for input(s): DDIMER in the last 72 hours. Hemoglobin A1C No results for input(s): HGBA1C in the last 72 hours. Fasting Lipid Panel No results for input(s): CHOL, HDL, LDLCALC, TRIG, CHOLHDL, LDLDIRECT in the last 72 hours. Thyroid Function Tests No results for input(s): TSH, T4TOTAL, T3FREE, THYROIDAB in the last 72 hours.  Invalid input(s): FREET3  Other results:     Imaging/Studies:  No results found.  Latest Echo  Latest Cath   Medications:     Scheduled Medications: . acidophilus  1 capsule Oral BID  . antiseptic oral rinse  7 mL Mouth Rinse BID  . aspirin EC  81 mg Oral Daily  . bisacodyl  10 mg Oral Daily   Or  . bisacodyl  10 mg Rectal Daily  . cephALEXin  500 mg Oral 3 times per day  . docusate sodium  200 mg Oral Daily  . enoxaparin (LOVENOX)  injection  30 mg Subcutaneous Q24H  . Influenza vac split quadrivalent PF  0.5 mL Intramuscular Tomorrow-1000  . metoprolol succinate  50 mg Oral BID  . pantoprazole  40 mg Oral Daily  . potassium chloride  40 mEq Oral Daily  . sildenafil  40 mg Oral TID  . sodium chloride  3 mL Intravenous Q12H  . spironolactone  12.5 mg Oral Daily  . torsemide  20 mg Oral Daily    Infusions: . sodium chloride 20 mL/hr at 09/25/14 1000  . sodium chloride    . sodium chloride 20 mL/hr at 09/29/14 1800  . lactated ringers Stopped (09/23/14 2300)    PRN Medications: sodium chloride, diphenhydrAMINE, Gerhardt's butt cream, metoCLOPramide, metoprolol, midazolam, ondansetron (ZOFRAN) IV, oxyCODONE, promethazine, sodium chloride, sodium chloride,  traMADol   Assessment/Plan   1. 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 H/o AVR (bioprosthesis) and MV ring 2012 3. 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  4. AKI 5. Persistent atrial fibrillation  6. Acute respiratory failure: Resolved.  7. Pulmonary arterial hypertension.   Overall doing well. Will restart home lasix. Start low-dose coumadin.  Should be ready for d/c to SNF tomorrow.   Length of Stay: 15  Arvilla Meres, MD  10:44 AM 10/05/2014  Advanced Heart Failure Team Pager [phone] (M-F; 7a - 4p)  Please contact [facility] for night-coverage after hours (4p -7a ) and weekends on [email or web address]

## 2014-10-06 LAB — BASIC METABOLIC PANEL
Anion gap: 8 (ref 5–15)
BUN: 17 mg/dL (ref 6–20)
CHLORIDE: 93 mmol/L — AB (ref 101–111)
CO2: 33 mmol/L — ABNORMAL HIGH (ref 22–32)
CREATININE: 1.35 mg/dL — AB (ref 0.44–1.00)
Calcium: 8.9 mg/dL (ref 8.9–10.3)
GFR, EST AFRICAN AMERICAN: 44 mL/min — AB (ref >60)
GFR, EST NON AFRICAN AMERICAN: 38 mL/min — AB (ref >60)
Glucose, Bld: 111 mg/dL — ABNORMAL HIGH (ref 65–99)
POTASSIUM: 4.3 mmol/L (ref 3.5–5.1)
SODIUM: 134 mmol/L — AB (ref 135–145)

## 2014-10-06 LAB — PROTIME-INR
INR: 1.22 (ref 0.00–1.49)
PROTHROMBIN TIME: 15.6 s — AB (ref 11.6–15.2)

## 2014-10-06 MED ORDER — METOPROLOL SUCCINATE ER 50 MG PO TB24: 50.0000 mg | ORAL_TABLET | Freq: Two times a day (BID) | ORAL | Status: AC

## 2014-10-06 MED ORDER — SPIRONOLACTONE 25 MG PO TABS: 12.5000 mg | ORAL_TABLET | Freq: Every day | ORAL | Status: AC

## 2014-10-06 MED ORDER — SILDENAFIL CITRATE 20 MG PO TABS: 40.0000 mg | ORAL_TABLET | Freq: Three times a day (TID) | ORAL | Status: DC

## 2014-10-06 MED ORDER — TRAMADOL HCL 50 MG PO TABS: 50.0000 mg | ORAL_TABLET | ORAL | Status: AC | PRN

## 2014-10-06 MED ORDER — ASPIRIN 81 MG PO TBEC: 81.0000 mg | DELAYED_RELEASE_TABLET | Freq: Every day | ORAL | Status: AC

## 2014-10-06 NOTE — Care Management Important Message (Signed)
Important Message  Patient Details  Name: Ashley [name] MRN: [id number] Date of Birth: 1940-06-07   Medicare Important Message Given:  Yes-fourth notification given    Orson Aloe 10/06/2014, 2:11 PM

## 2014-10-06 NOTE — Care Management Note (Signed)
Case Management Note Note started by Avie Arenas RNCM  Patient Details  Name: LESHEA JAGGERS MRN: [id number] Date of Birth: 02-28-1940  Subjective/Objective:    [place] in with [place] today[place] was updating daughter on patients plan for discharge.  Daughter states that this is not the plan.  None of the children are able to be off or around to care for Mom and would like her to go to go to Rehab facility.  Daughter states she had already talked with the physician.  PT ordered.  Will place SW consult.                Action/Plan:   Expected Discharge Date:    10/06/14              Expected Discharge Plan:  Skilled Nursing Facility  In-House Referral:  Clinical Social Work  Discharge planning Services  CM Consult  Post Acute Care Choice:    Choice offered to:     DME Arranged:    DME Agency:     HH Arranged:    HH Agency:     Status of Service:  Completed, signed off  Medicare Important Message Given:  Yes-fourth notification given Date Medicare IM Given:    Medicare IM give by:    Date Additional Medicare IM Given:    Additional Medicare Important Message give by:     If discussed at Long Length of Stay Meetings, dates discussed:  09/30/14, 10/02/14  Additional Comments:  10/06/14- Donn Pierini RN, BSN - pt for d/c today to SNF, CSW following for placement needs- working on SNF in [place] vs SNF here pending insurance approval.   Zenda Alpers, Lenn Sink, RN 10/06/2014, 9:39 AM

## 2014-10-06 NOTE — Progress Notes (Addendum)
Patient ID: Ashley [name], female   DOB: 01/19/1940, 74 y.o.   MRN: [id number]  Advanced Heart Failure Rounding Note  Primary Physician: Dr Karsten Fells Primary Cardiologist: Dr Armanda Magic previously. Primary HF: Bensimhon  Subjective:     Feels fine. Weight drifting up. No dyspnea.   Objective:   Weight Range: 75.297 kg (166 lb) Body mass index is 29.41 kg/(m^2).   Vital Signs:   Temp:  [97.5 F (36.4 C)-98.3 F (36.8 C)] 97.5 F (36.4 C) (09/26 0410) Pulse Rate:  [72-80] 72 (09/26 0410) Resp:  [18] 18 (09/26 0410) BP: (114-136)/(58-89) 129/77 mmHg (09/26 0410) SpO2:  [97 %-100 %] 100 % (09/26 0410) Weight:  [75.297 kg (166 lb)] 75.297 kg (166 lb) (09/26 0410) Last BM Date: 10/05/14  Weight change: Filed Weights   10/04/14 0454 10/05/14 0404 10/06/14 0410  Weight: 74.662 kg (164 lb 9.6 oz) 74.889 kg (165 lb 1.6 oz) 75.297 kg (166 lb)    Intake/Output:   Intake/Output Summary (Last 24 hours) at 10/06/14 0624 Last data filed at 10/05/14 0845  Gross per 24 hour  Intake    240 ml  Output      0 ml  Net    240 ml     Physical Exam: General: NAD, in bed HEENT: Normal Neck: supple. JVP 7-8 cm. Carotids 2+ bilat; no bruits. No lymphadenopathy or thyromegaly .  Cor: PMI nondisplaced. Irregularly irregular. 2/6 TR Lungs: CTA Abdomen: soft, NT, ND. No HSM. No bruits or masses noted. +BS Extremities: no cyanosis, clubbing, rash. TED hose in place.  No edema. Neuro: Moves all 4 extremities without difficulty. No focal deficits. Affect pleasant   Telemetry: Afib 60-65  Labs: CBC No results for input(s): WBC, NEUTROABS, HGB, HCT, MCV, PLT in the last 72 hours. Basic Metabolic Panel  Recent Labs  10/05/14 0529 10/06/14 0340  NA 135 134*  K 4.1 4.3  CL 91* 93*  CO2 35* 33*  GLUCOSE 105* 111*  BUN 20 17  CALCIUM 8.7* 8.9   Liver Function Tests No results for input(s): AST, ALT, ALKPHOS, BILITOT, PROT, ALBUMIN in the last 72 hours. No results for  input(s): LIPASE, AMYLASE in the last 72 hours. Cardiac Enzymes No results for input(s): CKTOTAL, CKMB, CKMBINDEX, TROPONINI in the last 72 hours.  BNP: BNP (last 3 results) No results for input(s): BNP in the last 8760 hours.  ProBNP (last 3 results) No results for input(s): PROBNP in the last 8760 hours.   D-Dimer No results for input(s): DDIMER in the last 72 hours. Hemoglobin A1C No results for input(s): HGBA1C in the last 72 hours. Fasting Lipid Panel No results for input(s): CHOL, HDL, LDLCALC, TRIG, CHOLHDL, LDLDIRECT in the last 72 hours. Thyroid Function Tests No results for input(s): TSH, T4TOTAL, T3FREE, THYROIDAB in the last 72 hours.  Invalid input(s): FREET3  Other results:     Imaging/Studies:  No results found.  Latest Echo  Latest Cath   Medications:     Scheduled Medications: . acidophilus  1 capsule Oral BID  . antiseptic oral rinse  7 mL Mouth Rinse BID  . aspirin EC  81 mg Oral Daily  . bisacodyl  10 mg Oral Daily   Or  . bisacodyl  10 mg Rectal Daily  . docusate sodium  200 mg Oral Daily  . enoxaparin (LOVENOX) injection  30 mg Subcutaneous Q24H  . furosemide  40 mg Oral Daily  . Influenza vac split quadrivalent PF  0.5 mL Intramuscular Tomorrow-1000  .  metoprolol succinate  50 mg Oral BID  . pantoprazole  40 mg Oral Daily  . potassium chloride  40 mEq Oral Daily  . sildenafil  40 mg Oral TID  . sodium chloride  3 mL Intravenous Q12H  . spironolactone  12.5 mg Oral Daily  . warfarin  2.5 mg Oral q1800  . Warfarin - Pharmacist Dosing Inpatient   Does not apply q1800    Infusions: . sodium chloride 20 mL/hr at 09/25/14 1000  . sodium chloride    . sodium chloride 20 mL/hr at 09/29/14 1800  . lactated ringers Stopped (09/23/14 2300)    PRN Medications: sodium chloride, diphenhydrAMINE, Gerhardt's butt cream, metoCLOPramide, metoprolol, midazolam, ondansetron (ZOFRAN) IV, oxyCODONE, promethazine, sodium chloride, sodium chloride,  traMADol   Assessment/Plan   1. 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 H/o AVR (bioprosthesis) and MV ring 2012 3. 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  4. AKI 5. Persistent atrial fibrillation  6. Acute respiratory failure: Resolved.  7. Pulmonary arterial hypertension.   Overall doing well. Back on po lasix. Need to clarify dosing (chart says 40 daily but she says she usually takes 2 tablets daily). Warfarin started.   Ok for d/c to SNF. F/u with her cardiologist in [place] (she refuses local f/u)  Use lasix 40 po bid.   Length of Stay: 16  Arvilla Meres, MD  6:24 AM 10/06/2014  Advanced Heart Failure Team Pager [phone] (M-F; 7a - 4p)  Please contact [facility] for night-coverage after hours (4p -7a ) and weekends on [email or web address]

## 2014-10-06 NOTE — Progress Notes (Signed)
Telebox and leads removed.  Sutures on mid abdomen removed x 2 with steristrips placed.  No s/s of infection.  Discharge instructions given.  Report called to [facility].  Pt discharged to facility with daughter.  Pt stable with no s/s of distress.

## 2014-10-06 NOTE — Progress Notes (Signed)
CARDIAC REHAB PHASE I   PRE:  Rate/Rhythm: 76 afib  BP:  Supine:   Sitting: 140/89  Standing:    SaO2: 99%RA ear  MODE:  Ambulation: 350 ft   POST:  Rate/Rhythm: 86 afib  BP:  Supine:   Sitting: 143/85  Standing:    SaO2: 93-96%RA ear 0928-1018 Pt walked 350 ft on RA with rolling walker and asst x 1. Stopped a few times to rest. Encouraged pursed lip breathing. Sats better on ear. Education completed with pt who voiced understanding. Encouraged IS. Gave CHF booklet and pt stated she weights daily. Reviewed when to call MD (yellow zone) of CHF booklet. Encouraged low sodium diet.    Ashley Nutting, RN BSN  10/06/2014 10:13 AM

## 2014-10-06 NOTE — Progress Notes (Signed)
Patient will discharge to [facility] Anticipated discharge date:10/06/14 Family notified: pt daughter Transportation by family  [employer] auth received and faxed to facility- confirmed they are able to admit today  CSW signing off.  Merlyn Lot, LCSWA Clinical Social Worker [phone]

## 2014-10-06 NOTE — Progress Notes (Signed)
      [address]       [place] [address]             [phone]      13 Days Post-Op Procedure(s) (LRB): REDO STERNOTOMY (N/A) REPAIR OF ASCENDING AORTIC PSEUDOANEURYSM (N/A) TRANSESOPHAGEAL ECHOCARDIOGRAM (TEE) (N/A)   Subjective:  Ashley [name] has no complaints.  Hoping to be discharged today to [facility] nursing center  Objective: Vital signs in last 24 hours: Temp:  [97.5 F (36.4 C)-98.3 F (36.8 C)] 97.5 F (36.4 C) (09/26 0410) Pulse Rate:  [72-78] 72 (09/26 0410) Cardiac Rhythm:  [-] Atrial fibrillation (09/25 1940) Resp:  [18] 18 (09/26 0410) BP: (114-136)/(58-77) 129/77 mmHg (09/26 0410) SpO2:  [97 %-100 %] 100 % (09/26 0410) Weight:  [166 lb (75.297 kg)] 166 lb (75.297 kg) (09/26 0410)  Intake/Output from previous day: 09/25 0701 - 09/26 0700 In: 240 [P.O.:240] Out: -   General appearance: alert, cooperative and no distress Heart: irregular rate and rhythm Lungs: clear to auscultation bilaterally Abdomen: soft, non-tender; bowel sounds normal; no masses,  no organomegaly Extremities: edema trace Wound: clean and dry  Lab Results: No results for input(s): WBC, HGB, HCT, PLT in the last 72 hours. BMET:  Recent Labs  10/05/14 0529 10/06/14 0340  NA 135 134*  K 4.1 4.3  CL 91* 93*  CO2 35* 33*  GLUCOSE 105* 111*  BUN 20 17  CREATININE 1.56* 1.35*  CALCIUM 8.7* 8.9    PT/INR:  Recent Labs  10/06/14 0340  LABPROT 15.6*  INR 1.22   ABG    Component Value Date/Time   PHART 7.368 09/24/2014 1853   HCO3 20.9 09/24/2014 1853   TCO2 34 09/29/2014 1822   ACIDBASEDEF 4.0* 09/24/2014 1853   O2SAT 71.6 10/02/2014 0502   CBG (last 3)   Recent Labs  10/05/14 0758 10/05/14 1122 10/05/14 1629  GLUCAP 149* 125* 122*    Assessment/Plan: S/P Procedure(s) (LRB): REDO STERNOTOMY (N/A) REPAIR OF ASCENDING AORTIC PSEUDOANEURYSM (N/A) TRANSESOPHAGEAL ECHOCARDIOGRAM (TEE) (N/A)  1. CV- remains hemodynamically stable- on Toprol  XL, Sildenafil, Spironolactone 2. Pulm- off oxygen, continue IS 3. Renal- creatinine stable, continue diuretics 4. DM- cbgs, controlled 5. ID- sternal drainage on Keflex 6. Deconditioning- SNF at discharge 7. Dispo- patient stable, will d/c today if bed available   LOS: 16 days    [name], [name] 10/06/2014

## 2014-10-13 ENCOUNTER — Encounter (HOSPITAL_COMMUNITY): Payer: Self-pay

## 2014-10-13 ENCOUNTER — Ambulatory Visit (HOSPITAL_COMMUNITY)
Admit: 2014-10-13 | Discharge: 2014-10-13 | Disposition: A | Discharge status: Home / Self Care | Payer: Medicare Other | Source: Ambulatory Visit | Attending: Cardiology | Admitting: Cardiology

## 2014-10-13 VITALS — BP 140/78 | HR 72 | Ht 63.0 in | Wt 176.4 lb

## 2014-10-13 DIAGNOSIS — I2721 Secondary pulmonary arterial hypertension: Secondary | ICD-10-CM

## 2014-10-13 DIAGNOSIS — Z7901 Long term (current) use of anticoagulants: Secondary | ICD-10-CM | POA: Diagnosis not present

## 2014-10-13 DIAGNOSIS — Z9889 Other specified postprocedural states: Secondary | ICD-10-CM | POA: Diagnosis not present

## 2014-10-13 DIAGNOSIS — I481 Persistent atrial fibrillation: Secondary | ICD-10-CM | POA: Diagnosis not present

## 2014-10-13 DIAGNOSIS — I711 Thoracic aortic aneurysm, ruptured, unspecified: Secondary | ICD-10-CM

## 2014-10-13 DIAGNOSIS — I272 Other secondary pulmonary hypertension: Secondary | ICD-10-CM | POA: Diagnosis not present

## 2014-10-13 DIAGNOSIS — Z953 Presence of xenogenic heart valve: Secondary | ICD-10-CM | POA: Insufficient documentation

## 2014-10-13 DIAGNOSIS — I10 Essential (primary) hypertension: Secondary | ICD-10-CM | POA: Insufficient documentation

## 2014-10-13 DIAGNOSIS — Z79899 Other long term (current) drug therapy: Secondary | ICD-10-CM | POA: Insufficient documentation

## 2014-10-13 DIAGNOSIS — I482 Chronic atrial fibrillation, unspecified: Secondary | ICD-10-CM

## 2014-10-13 DIAGNOSIS — Z7982 Long term (current) use of aspirin: Secondary | ICD-10-CM | POA: Insufficient documentation

## 2014-10-13 LAB — BASIC METABOLIC PANEL
Anion gap: 12 (ref 5–15)
BUN: 11 mg/dL (ref 6–20)
CO2: 28 mmol/L (ref 22–32)
CREATININE: 1.26 mg/dL — AB (ref 0.44–1.00)
Calcium: 9.7 mg/dL (ref 8.9–10.3)
Chloride: 99 mmol/L — ABNORMAL LOW (ref 101–111)
GFR, EST AFRICAN AMERICAN: 47 mL/min — AB (ref >60)
GFR, EST NON AFRICAN AMERICAN: 41 mL/min — AB (ref >60)
Glucose, Bld: 87 mg/dL (ref 65–99)
Potassium: 3.3 mmol/L — ABNORMAL LOW (ref 3.5–5.1)
SODIUM: 139 mmol/L (ref 135–145)

## 2014-10-13 LAB — CBC
HCT: 37.2 % (ref 36.0–46.0)
HEMOGLOBIN: 11.4 g/dL — AB (ref 12.0–15.0)
MCH: 29.8 pg (ref 26.0–34.0)
MCHC: 30.6 g/dL (ref 30.0–36.0)
MCV: 97.4 fL (ref 78.0–100.0)
PLATELETS: 264 10*3/uL (ref 150–400)
RBC: 3.82 MIL/uL — AB (ref 3.87–5.11)
RDW: 17.1 % — ABNORMAL HIGH (ref 11.5–15.5)
WBC: 6.1 10*3/uL (ref 4.0–10.5)

## 2014-10-13 LAB — PROTIME-INR
INR: 1.48 (ref 0.00–1.49)
PROTHROMBIN TIME: 18 s — AB (ref 11.6–15.2)

## 2014-10-13 NOTE — Progress Notes (Signed)
Advanced Heart Failure Clinic Note  Patient ID: Ashley [name], female   DOB: [birth year], 74 y.o.   MRN: [id number]   Primary Physician: Ashley [name] Primary Cardiologist: Ashley [name] previously. Primary HF: Ashley [name]  HPI:  Ashley [name] is a 74 y/o woman with h/o rheumatic heart disease s/p AVR (bioprosthesis) and MV ring in 2012. Also has h/o persistent AF, pulmonary HTN with RV dysfunction  Admitted last month with ascending aortic pseudoaneurysm with surrounding hematoma. Underwent s/p surgical repair on 09/23/14  Post-op course complicated by RV failure and marked volume overload. Required milrinone and addition of sildenafil. Had slow but steady recovery. Discharged to SNF. Low-dose coumadin started on d/c for chronic AF with goal INR 1.5-2.0 to minimize risk of rebleeding into aortic site.   Doing well. Working with PT. Starting to walk. Mild DOE. No edema. Weight has come down to 162. No orthopnea or PND. No CP. No bleeding.    Current Outpatient Prescriptions on File Prior to Encounter  Medication Sig Dispense Refill  . acetaminophen (TYLENOL) 325 MG tablet Take 650 mg by mouth every 6 (six) hours as needed (pain).     Marland Kitchen alendronate (FOSAMAX) 70 MG tablet Take 70 mg by mouth once a week. On Saturdays  11  . aspirin EC 81 MG EC tablet Take 1 tablet (81 mg total) by mouth daily.    . Calcium Carb-Cholecalciferol (CALCIUM + D3 PO) Take 1 tablet by mouth daily. Calcium 1200 mg    . cyclobenzaprine (FLEXERIL) 10 MG tablet Take 10 mg by mouth 3 (three) times daily as needed.  0  . ferrous sulfate 325 (65 FE) MG EC tablet Take 325 mg by mouth 2 (two) times daily with a meal.      . furosemide (LASIX) 40 MG tablet Take 40 mg by mouth See admin instructions. Take 1 tablet (40 mg) by mouth with breakfast and supper, may take an additional tablet (40 mg) at bedtime as needed for leg swelling  6  . meclizine (ANTIVERT) 25 MG tablet  Take 25 mg by mouth 3 (three) times daily as needed.  0  . metoprolol succinate (TOPROL-XL) 50 MG 24 hr tablet Take 1 tablet (50 mg total) by mouth 2 (two) times daily. Take with or immediately following a meal.    . montelukast (SINGULAIR) 10 MG tablet Take 10 mg by mouth daily with supper.  11  . pantoprazole (PROTONIX) 40 MG tablet Take 40 mg by mouth daily.      . potassium chloride SA (K-DUR,KLOR-CON) 20 MEQ tablet Take 20 mEq by mouth daily with supper.    . pravastatin (PRAVACHOL) 20 MG tablet Take 20 mg by mouth daily with supper.   11  . sildenafil (REVATIO) 20 MG tablet Take 2 tablets (40 mg total) by mouth 3 (three) times daily.  0  . spironolactone (ALDACTONE) 25 MG tablet Take 0.5 tablets (12.5 mg total) by mouth daily.    . traMADol (ULTRAM) 50 MG tablet Take 1-2 tablets (50-100 mg total) by mouth every 4 (four) hours as needed for moderate pain. 30 tablet 0  . warfarin (COUMADIN) 2.5 MG tablet Take 2.5-3.75 mg by mouth daily with supper. Take 1 tablet (2.5 mg) by mouth on 1st day, then take 1 1/2 tablets (3.75 mg) on 2nd day, then repeat     No current facility-administered medications on file prior to encounter.      Objective:   Weight Range:  176 lb 6.4 oz (80.015 kg) Body mass index is 31.26 kg/(m^2).   Vital Signs:   Pulse Rate:  [72] 72 (10/03 1020) BP: (176)/(104) 176/104 mmHg (10/03 1020) SpO2:  [98 %] 98 % (10/03 1020) Weight:  [176 lb 6.4 oz (80.015 kg)] 176 lb 6.4 oz (80.015 kg) (10/03 1020)    Weight change: Filed Weights   10/13/14 1020  Weight: 176 lb 6.4 oz (80.015 kg)    Intake/Output:  No intake or output data in the 24 hours ending 10/13/14 1117   Physical Exam: General: NAD HEENT: Normal Neck: supple. JVP 7-8 cm. Carotids 2+ bilat; no bruits. No lymphadenopathy or thyromegaly .  Cor: PMI nondisplaced. Irregularly irregular. 2/6 TR Lungs: CTA Abdomen: soft, NT, ND. No HSM. No bruits or masses noted. +BS Extremities: no cyanosis, clubbing,  rash. No edema. Neuro: Moves all 4 extremities without difficulty. No focal deficits. Affect pleasant   Assessment/Plan   1. 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 H/o AVR (bioprosthesis) and MV ring 2012 3. 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  4. Persistent atrial fibrillation  5. HTN  Doing very well volume status looks very good. Continue current meds including sildenafil. Continue coumadin with goal INR 1.5-2.0. Check labs today. F/u with Ashley [name].  I removed the staples from her left chest incision today.   Ashley Meres, MD  11:17 AM 10/13/2014

## 2014-10-13 NOTE — Patient Instructions (Signed)
Labs today  Follow up with Dr Margarite Gouge

## 2014-10-15 ENCOUNTER — Telehealth (HOSPITAL_COMMUNITY): Payer: Self-pay | Admitting: *Deleted

## 2014-10-15 MED ORDER — POTASSIUM CHLORIDE CRYS ER 20 MEQ PO TBCR: 40.0000 meq | EXTENDED_RELEASE_TABLET | Freq: Every day | ORAL | Status: AC

## 2014-10-15 NOTE — Telephone Encounter (Signed)
-----   Message from Dolores Patty, MD sent at 10/14/2014  7:12 PM EDT ----- Add kcl 20 daily. Have her take 40 kcl for first dose

## 2014-10-15 NOTE — Telephone Encounter (Signed)
Notes Recorded by Noralee Space, RN on 10/15/2014 at 2:02 PM Spoke w/pt, she is aware and is currently in a rehab facility, she ask that I call them at [phone] to give orders. Called the number and spoke w/Teres and gave VO for 40 extra today and to increase dose to 40 meq daily

## 2014-10-19 LAB — FUNGUS CULTURE W SMEAR
Fungal Smear: NONE SEEN
Fungal Smear: NONE SEEN

## 2014-11-04 ENCOUNTER — Other Ambulatory Visit: Payer: Self-pay | Admitting: Cardiothoracic Surgery

## 2014-11-04 DIAGNOSIS — I719 Aortic aneurysm of unspecified site, without rupture: Secondary | ICD-10-CM

## 2014-11-05 ENCOUNTER — Encounter: Payer: Self-pay | Admitting: Cardiothoracic Surgery

## 2014-11-05 ENCOUNTER — Ambulatory Visit (INDEPENDENT_AMBULATORY_CARE_PROVIDER_SITE_OTHER): Payer: Self-pay | Admitting: Cardiothoracic Surgery

## 2014-11-05 ENCOUNTER — Ambulatory Visit
Admission: RE | Admit: 2014-11-05 | Discharge: 2014-11-05 | Disposition: A | Discharge status: Home / Self Care | Payer: Medicare Other | Source: Ambulatory Visit | Attending: Cardiothoracic Surgery | Admitting: Cardiothoracic Surgery

## 2014-11-05 VITALS — BP 154/90 | HR 106 | Resp 16 | Ht 63.0 in | Wt 150.0 lb

## 2014-11-05 DIAGNOSIS — I719 Aortic aneurysm of unspecified site, without rupture: Secondary | ICD-10-CM

## 2014-11-05 DIAGNOSIS — Z9889 Other specified postprocedural states: Secondary | ICD-10-CM

## 2014-11-05 DIAGNOSIS — Z952 Presence of prosthetic heart valve: Secondary | ICD-10-CM

## 2014-11-05 DIAGNOSIS — Z954 Presence of other heart-valve replacement: Secondary | ICD-10-CM

## 2014-11-05 LAB — AFB CULTURE WITH SMEAR (NOT AT ARMC)
Acid Fast Smear: NONE SEEN
Acid Fast Smear: NONE SEEN

## 2014-11-05 NOTE — Progress Notes (Signed)
PCP is Karsten Fells, MD Referring Provider is Karsten Fells, MD  Chief Complaint  Patient presents with  . Routine Post Op    for REPAIR OF PSEUDOANEURYSM OF THORACIC AORTA 09/23/14 with a cxr    HPI: The patient returns for 6 week followup after urgent redo sternotomy and repair of a pseudoaneurysm of the ascending aorta at the site of a previous suture line from aVR and mitral valve repair 2012. Right axillary artery cannulation was used as inflow for cardiopulmonary bypass.  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 She is also on spironolactone for her heart failure.  The patient spent proximally 2 weeks in a rehabilitation facility in [facility]. She is now back with home living independently and is doing well. No symptoms are clear. She is ready to driveandlift things up to 15 pounds maximum  She denies any problems of bleeding from her Coumadin. Because of her recent pseudoaneurysm of her aorta she is at risk for rebleeding in the next 3 months and her target INR should be 1.5-2.0..  Past Medical History  Diagnosis Date  . HTN (hypertension)   . Dyslipidemia   . GERD (gastroesophageal reflux disease)   . Mitral regurgitation     on recent TEE 07/07/2010  . DJD (degenerative joint disease), cervical     s/p epidural steroid injections  . Hiatal hernia   . Osteopenia     frax neg 11/10, repeat in 2-4 years DJD C spine Dr. Ethelene Hal s/p epidural steroid injections   . Paroxysmal atrial fibrillation (HCC)     on coumadin  . Diastolic dysfunction   . Rheumatic aortic stenosis     I will get an echo today to  evaluate her murmur  . Heart murmur   . Diverticulitis     Past Surgical History  Procedure Laterality Date  . Total vaginal hysterectomy    . Cholecystectomy    . Left knee surgery    . Bilateral shoulder surgery    . Umbilical hernia repair x2    . Mitral valve repair  04/29/2010    26-mm Edwards annuloplasty ring and posterior commissure plasty, Dr Donata Clay   . Aortic valve replacement  04/29/2010    19 mm supra-annular pericardial tissue valve(serial number [id number]) Dr Donata Clay  . Cox-maze microwave ablation  04/29/2010    left sided, Dr Donata Clay  . Delayed sternal closure  04/30/2010    Dr Donata Clay  . Cardiac catheterization  04/27/2010    Dr Armanda Magic  . Cardioverted  05/19/2010    Dr Donato Schultz  . C5-c6 fusion    . Rf catheter ablation of afib  11/2009  . US echocardiography  07-2010, 05-04-2011    moderate AS of AVR, mo MR, mild pulm HTN RSVP 50-29mmHg, grade3, 05-04-2011 Echo normal LVF, stable MV ring with mild MS, mild LAE, mild PR, mod TRmoderate pulmonary HTN, prosthetic AVR with mild AS, grade 2 diastolic dysfunction  . Replacement ascending aorta N/A 09/23/2014    Procedure: REPAIR OF ASCENDING AORTIC PSEUDOANEURYSM;  Surgeon: Kerin Perna, MD;  Location: [facility] OR;  Service: Open Heart Surgery;  Laterality: N/A;  Nitrous oxide  . Tee without cardioversion N/A 09/23/2014    Procedure: TRANSESOPHAGEAL  ECHOCARDIOGRAM (TEE);  Surgeon: Kerin Perna, MD;  Location: [facility] OR;  Service: Open Heart Surgery;  Laterality: N/A;    Family History  Problem Relation Age of Onset  . Diabetes type II    . Coronary artery disease Father     s/p cabg in the 1970's  . Heart disease    . Heart disease Father   . Heart attack Mother   . Hypertension Brother   . Hypertension Sister   . Parkinsonism Sister     Social History Social History  Substance Use Topics  . Smoking status: Former Smoker    Types: Cigarettes  . Smokeless tobacco: None  . Alcohol Use: No     Current Outpatient Prescriptions  Medication Sig Dispense Refill  . acetaminophen (TYLENOL) 325 MG tablet Take 650 mg by mouth every 6 (six) hours as needed (pain).     Marland Kitchen alendronate (FOSAMAX) 70 MG tablet Take 70 mg by mouth once a week. On Saturdays  11  . aspirin EC 81 MG EC tablet Take 1 tablet (81 mg total) by mouth daily.    . Calcium Carb-Cholecalciferol (CALCIUM + D3 PO) Take 1 tablet by mouth daily. Calcium 1200 mg    . ferrous sulfate 325 (65 FE) MG EC tablet Take 325 mg by mouth 2 (two) times daily with a meal.      . furosemide (LASIX) 40 MG tablet Take 40 mg by mouth See admin instructions. Take 1 tablet (40 mg) by mouth with breakfast and supper, may take an additional tablet (40 mg) at bedtime as needed for leg swelling  6  . meclizine (ANTIVERT) 25 MG tablet Take 25 mg by mouth 3 (three) times daily as needed.  0  . metoprolol succinate (TOPROL-XL) 50 MG 24 hr tablet Take 1 tablet (50 mg total) by mouth 2 (two) times daily. Take with or immediately following a meal.    . montelukast (SINGULAIR) 10 MG tablet Take 10 mg by mouth daily with supper.  11  . pantoprazole (PROTONIX) 40 MG tablet Take 40 mg by mouth daily.      . potassium chloride SA (K-DUR,KLOR-CON) 20 MEQ tablet Take 2 tablets (40 mEq total) by mouth daily with supper.    . pravastatin (PRAVACHOL) 20 MG tablet Take 20 mg by mouth daily with supper.   11  . sildenafil (REVATIO) 20 MG tablet Take 2 tablets (40 mg total) by mouth 3 (three) times daily.  0  . spironolactone (ALDACTONE) 25 MG tablet Take 0.5 tablets (12.5 mg total) by mouth daily.    Marland Kitchen warfarin (COUMADIN) 2.5 MG tablet Take 2.5-3.75 mg by mouth daily with supper. Take 1 tablet (2.5 mg) by mouth on 1st day, then take 1 1/2 tablets (3.75 mg) on 2nd day, then repeat    . traMADol (ULTRAM) 50 MG tablet Take 1-2 tablets (50-100 mg total) by mouth every 4 (four) hours as needed for moderate pain. (Patient not taking: Reported on 11/05/2014) 30 tablet 0   No  current facility-administered medications for this visit.    Allergies  Allergen Reactions  . Codeine Nausea And Vomiting  . Darvocet [Propoxyphene N-Acetaminophen] Nausea And Vomiting  . Iodine Other (See Comments)    Pt does not recall this reaction  . Lisinopril Other (See Comments)    Possible cough    Review of Systems   Overall improved strength Complains of some tremors No fluid retention problems No orthopnea or PND  BP 154/90 mmHg  Pulse 106  Resp 16  Ht 5\' 3"  (1.6 m)  Wt 150 lb (68.04 kg)  BMI 26.58 kg/m2  SpO2 98% Physical Exam Alert and comfortable she appears to be doing very well Lungs clear Heart rate irregular-atrial fibrillation 2/6 holosystolic murmur from tricuspid regurgitation   Diagnostic Tests: Chest x-ray with clear lung fields, stable cardiac silhouette, sternal wires intact No pleural effusion or pulmonary edema  Impression: Excellent recovery after repair of aortic pseudoaneurysm She has underlying severe pulmonary hypertension and was started on sildenafil by her cardiologist to assist with her right heart failure and that should be continued.  The patient's INR goal should be low at 1.5-2.0 because of her recent cardiac surgery. He pseudoaneurysm.  Importance of systemic blood pressure control was emphasized to the patient as well as importance of good diet and aerobic type exercises on a regular basis.  Plan: She will return for followup in 5 months for review of her progress. I would hold off on a CTA of her thoracic aorta at this time because of the IV contrast risk.  Mikey BussingPeter Van Trigt III, MD [facility] [phone]

## 2015-04-08 ENCOUNTER — Other Ambulatory Visit: Payer: Self-pay | Admitting: *Deleted

## 2015-04-08 ENCOUNTER — Ambulatory Visit (INDEPENDENT_AMBULATORY_CARE_PROVIDER_SITE_OTHER): Payer: Medicare Other | Admitting: Cardiothoracic Surgery

## 2015-04-08 ENCOUNTER — Encounter: Payer: Self-pay | Admitting: Cardiothoracic Surgery

## 2015-04-08 ENCOUNTER — Ambulatory Visit
Admission: RE | Admit: 2015-04-08 | Discharge: 2015-04-08 | Disposition: A | Discharge status: Home / Self Care | Payer: Medicare Other | Source: Ambulatory Visit | Attending: Cardiothoracic Surgery | Admitting: Cardiothoracic Surgery

## 2015-04-08 VITALS — BP 159/97 | HR 74 | Resp 20 | Ht 63.0 in | Wt 163.0 lb

## 2015-04-08 DIAGNOSIS — Z952 Presence of prosthetic heart valve: Secondary | ICD-10-CM

## 2015-04-08 DIAGNOSIS — R609 Edema, unspecified: Secondary | ICD-10-CM

## 2015-04-08 DIAGNOSIS — Z9889 Other specified postprocedural states: Secondary | ICD-10-CM

## 2015-04-08 DIAGNOSIS — I719 Aortic aneurysm of unspecified site, without rupture: Secondary | ICD-10-CM | POA: Diagnosis not present

## 2015-04-08 DIAGNOSIS — Z954 Presence of other heart-valve replacement: Secondary | ICD-10-CM | POA: Diagnosis not present

## 2015-04-08 NOTE — Progress Notes (Signed)
PCP is Karsten Fells, MD Referring Provider is Karsten Fells, MD  Chief Complaint  Patient presents with  . Follow-up    5 month f/u  . Routine Post Op    HPI: The patient returns for 6 month followup after redo sternotomy for repair of a false aneurysm of the ascending aorta after previous aVR and mitral valve repair. Her cardiac status is stable and despite her history of chronic pulmonary hypertension and tricuspid valve regurgitation she denies any symptoms of right heart failure edema or abdominal swallowing.  A month ago the patient had urgent right hemicolectomy for a colon mass which reportedly was benign. She tolerated the surgery well from cardiac perspective and is now back on her Coumadin for history of chronic atrial fibrillation.  She denies symptoms of angina or heart failure. She does have some positional discomfort in her chest following her abdominal surgery. Her chest x-ray today is clear. No pleural effusion or abnormal cardiac silhouette.  Past Medical History  Diagnosis Date  . HTN (hypertension)   . Dyslipidemia   . GERD (gastroesophageal reflux disease)   . Mitral regurgitation     on recent TEE 07/07/2010  . DJD (degenerative joint disease), cervical     s/p epidural steroid injections  . Hiatal hernia   . Osteopenia     frax neg 11/10, repeat in 2-4 years DJD C spine Dr. Ethelene Hal s/p epidural steroid injections   . Paroxysmal atrial fibrillation (HCC)     on coumadin  . Diastolic dysfunction   . Rheumatic aortic stenosis     I will get an echo today to evaluate her murmur  . Heart murmur   . Diverticulitis     Past Surgical History  Procedure Laterality Date  . Total vaginal hysterectomy    . Cholecystectomy    . Left knee surgery    . Bilateral shoulder surgery    . Umbilical hernia repair x2    . Mitral valve repair  04/29/2010    26-mm Edwards annuloplasty ring and posterior commissure plasty, Dr Donata Clay   . Aortic valve replacement   04/29/2010    19 mm supra-annular pericardial tissue valve(serial number [id number]) Dr Donata Clay  . Cox-maze microwave ablation  04/29/2010    left sided, Dr Donata Clay  . Delayed sternal closure  04/30/2010    Dr Donata Clay  . Cardiac catheterization  04/27/2010    Dr Armanda Magic  . Cardioverted  05/19/2010    Dr Donato Schultz  . C5-c6 fusion    . Rf catheter ablation of afib  11/2009  . US echocardiography  07-2010, 05-04-2011    moderate AS of AVR, mo MR, mild pulm HTN RSVP 50-8mmHg, grade3, 05-04-2011 Echo normal LVF, stable MV ring with mild MS, mild LAE, mild PR, mod TRmoderate pulmonary HTN, prosthetic AVR with mild AS, grade 2 diastolic dysfunction  . Replacement ascending aorta N/A 09/23/2014    Procedure: REPAIR OF ASCENDING AORTIC PSEUDOANEURYSM;  Surgeon: Kerin Perna, MD;  Location: [facility] OR;  Service: Open Heart Surgery;  Laterality: N/A;  Nitrous oxide  . Tee without cardioversion N/A 09/23/2014    Procedure: TRANSESOPHAGEAL ECHOCARDIOGRAM (TEE);  Surgeon: Kerin Perna, MD;  Location: [facility] OR;  Service: Open Heart Surgery;  Laterality: N/A;    Family History  Problem Relation Age of Onset  . Diabetes type II    . Coronary artery disease Father     s/p cabg in the 1970's  . Heart disease    .  Heart disease Father   . Heart attack Mother   . Hypertension Brother   . Hypertension Sister   . Parkinsonism Sister     Social History Social History  Substance Use Topics  . Smoking status: Former Smoker    Types: Cigarettes  . Smokeless tobacco: None  . Alcohol Use: No    Current Outpatient Prescriptions  Medication Sig Dispense Refill  . acetaminophen (TYLENOL) 325 MG tablet Take 650 mg by mouth every 6 (six) hours as needed (pain).     Marland Kitchen alendronate (FOSAMAX) 70 MG tablet Take 70 mg by mouth once a week. On Saturdays  11  . aspirin EC 81 MG EC tablet Take 1 tablet (81 mg total) by mouth daily.    . Calcium Carb-Cholecalciferol (CALCIUM + D3 PO) Take 1 tablet by mouth  daily. Calcium 1200 mg    . ferrous sulfate 325 (65 FE) MG EC tablet Take 325 mg by mouth 2 (two) times daily with a meal.      . furosemide (LASIX) 40 MG tablet Take 40 mg by mouth See admin instructions. Take 1 tablet (40 mg) by mouth with breakfast and supper, may take an additional tablet (40 mg) at bedtime as needed for leg swelling  6  . meclizine (ANTIVERT) 25 MG tablet Take 25 mg by mouth 3 (three) times daily as needed.  0  . metoprolol succinate (TOPROL-XL) 50 MG 24 hr tablet Take 1 tablet (50 mg total) by mouth 2 (two) times daily. Take with or immediately following a meal.    . montelukast (SINGULAIR) 10 MG tablet Take 10 mg by mouth daily with supper.  11  . pantoprazole (PROTONIX) 40 MG tablet Take 40 mg by mouth daily.      . potassium chloride SA (K-DUR,KLOR-CON) 20 MEQ tablet Take 2 tablets (40 mEq total) by mouth daily with supper.    . pravastatin (PRAVACHOL) 20 MG tablet Take 20 mg by mouth daily with supper.   11  . spironolactone (ALDACTONE) 25 MG tablet Take 0.5 tablets (12.5 mg total) by mouth daily.    . traMADol (ULTRAM) 50 MG tablet Take 1-2 tablets (50-100 mg total) by mouth every 4 (four) hours as needed for moderate pain. 30 tablet 0  . warfarin (COUMADIN) 2.5 MG tablet Take 2.5-3.75 mg by mouth daily with supper. Take 1 tablet (2.5 mg) by mouth on 1st day, then take 1 1/2 tablets (3.75 mg) on 2nd day, then repeat     No current facility-administered medications for this visit.    Allergies  Allergen Reactions  . Codeine Nausea And Vomiting  . Darvocet [Propoxyphene N-Acetaminophen] Nausea And Vomiting  . Iodine Other (See Comments)    Pt does not recall this reaction  . Lisinopril Other (See Comments)    Possible cough    Review of Systems   Still getting her strength back following laparotomy and right hemicolectomy. Has some intermittent palpitations from radial fibrillation.  BP 159/97 mmHg  Pulse 74  Resp 20  Ht  (1.6 m)  Wt 163 lb (73.936  kg)  BMI 28.88 kg/m2  SpO2 97% Physical Exam Alert and pleasant, comfortable accompanied by daughter Lungs clear Heart rate regular today, no murmur or gallop appreciated Recent abdominal surgical scars well-healed. No abdominal tenderness Trace pedal edema  Diagnostic Tests: Chest x-ray today is clear and personally reviewed  Impression: Doing well 6 months postop repair of aortic false aneurysm. Blood pressure control is important and she understands the importance  of being compliant with her medications.  Plan: Return in 6 months with followup chest x-ray to monitor progress.  Mikey BussingPeter Van Trigt III, MD [facility] [phone]

## 2015-04-09 ENCOUNTER — Encounter: Payer: Self-pay | Admitting: *Deleted

## 2015-10-12 ENCOUNTER — Other Ambulatory Visit: Payer: Self-pay | Admitting: Cardiothoracic Surgery

## 2015-10-12 DIAGNOSIS — Z952 Presence of prosthetic heart valve: Secondary | ICD-10-CM

## 2015-10-13 ENCOUNTER — Ambulatory Visit (INDEPENDENT_AMBULATORY_CARE_PROVIDER_SITE_OTHER): Payer: Medicare Other | Admitting: Cardiothoracic Surgery

## 2015-10-13 ENCOUNTER — Ambulatory Visit
Admission: RE | Admit: 2015-10-13 | Discharge: 2015-10-13 | Disposition: A | Discharge status: Home / Self Care | Payer: Medicare Other | Source: Ambulatory Visit | Attending: Cardiothoracic Surgery | Admitting: Cardiothoracic Surgery

## 2015-10-13 ENCOUNTER — Encounter: Payer: Self-pay | Admitting: Cardiothoracic Surgery

## 2015-10-13 VITALS — BP 166/82 | HR 66 | Resp 20 | Ht 63.0 in | Wt 174.0 lb

## 2015-10-13 DIAGNOSIS — Z952 Presence of prosthetic heart valve: Secondary | ICD-10-CM

## 2015-10-13 DIAGNOSIS — I719 Aortic aneurysm of unspecified site, without rupture: Secondary | ICD-10-CM

## 2015-10-13 DIAGNOSIS — Z9889 Other specified postprocedural states: Secondary | ICD-10-CM | POA: Diagnosis not present

## 2015-10-13 NOTE — Progress Notes (Signed)
PCP is Karsten Fells, MD Referring Provider is Karsten Fells, MD  Chief Complaint  Patient presents with  . 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 5 years ago the patient underwent AVR mitral valve repair and maze atrial ablation procedure. The aortic cannulation site at that time blood with the sutures tore through the friable aortic tissue and was repaired with a patch.  Patient's last echo showed normal LV function with normal functioning aortic valve and mitral valve repair. The patient does have right heart failure with peripheral edema, moderate TR and atrial fibrillation on Coumadin. She is followed by her cardiologist in [facility] Dr. Margarite Gouge. She currently complains of increased and her ankle swelling and a chronic dry cough.  Chest x-ray today shows some evidence of COPD but no edema or significant pleural effusion Past Medical History:  Diagnosis Date  . Diastolic dysfunction   . Diverticulitis   . DJD (degenerative joint disease), cervical    s/p epidural steroid injections  . Dyslipidemia   . GERD (gastroesophageal reflux disease)   . Heart murmur   . Hiatal hernia   . HTN (hypertension)   . Mitral regurgitation    on recent TEE 07/07/2010  . Osteopenia    frax neg 11/10, repeat in 2-4 years DJD C spine Dr. Ethelene Hal s/p epidural steroid injections   . Paroxysmal atrial fibrillation (HCC)    on coumadin  . Rheumatic aortic stenosis    I will get an echo today to evaluate her murmur    Past Surgical History:  Procedure Laterality Date  . AORTIC VALVE REPLACEMENT  04/29/2010   19 mm supra-annular pericardial tissue valve(serial number [id number]) Dr Donata Clay  . bilateral shoulder surgery    . c5-c6 fusion    . CARDIAC CATHETERIZATION  04/27/2010   Dr Armanda Magic  .  cardioverted  05/19/2010   Dr Donato Schultz  . CHOLECYSTECTOMY    . 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 delayed sternal closure  04/30/2010   Dr Donata Clay  . left knee surgery    . MITRAL VALVE REPAIR  04/29/2010   26-mm Edwards annuloplasty ring and posterior commissure plasty, Dr Donata Clay   . REPLACEMENT ASCENDING AORTA N/A 09/23/2014   Procedure: REPAIR OF ASCENDING AORTIC PSEUDOANEURYSM;  Surgeon: Kerin Perna, MD;  Location: [facility] OR;  Service: Open Heart Surgery;  Laterality: N/A;  Nitrous oxide  . RF catheter ablation of afib  11/2009  . TEE WITHOUT CARDIOVERSION N/A 09/23/2014   Procedure: TRANSESOPHAGEAL ECHOCARDIOGRAM (TEE);  Surgeon: Kerin Perna, MD;  Location: [facility] OR;  Service: Open Heart Surgery;  Laterality: N/A;  . TOTAL VAGINAL HYSTERECTOMY    . umbilical hernia repair x2    . US ECHOCARDIOGRAPHY  07-2010, 05-04-2011   moderate AS of AVR, mo MR, mild pulm HTN RSVP 50-7mmHg, grade3, 05-04-2011 Echo normal LVF, stable MV ring with mild MS, mild LAE, mild PR, mod TRmoderate pulmonary HTN, prosthetic AVR with mild AS, grade 2 diastolic dysfunction    Family History  Problem Relation Age of Onset  . Diabetes type II    . Coronary artery disease Father     s/p cabg in the 1970's  . Heart disease    . Heart disease Father   .  Heart attack Mother   . Hypertension Brother   . Hypertension Sister   . Parkinsonism Sister     Social History Social History  Substance Use Topics  . Smoking status: Former Smoker    Types: Cigarettes  . Smokeless tobacco: Not on file  . Alcohol use No    Current Outpatient Prescriptions  Medication Sig Dispense Refill  . acetaminophen (TYLENOL) 325 MG tablet Take 650 mg by mouth every 6 (six) hours as needed (pain).     Marland Kitchen. alendronate (FOSAMAX) 70 MG tablet Take 70 mg by mouth once a week. On Saturdays  11  . aspirin EC 81 MG EC tablet Take 1 tablet (81 mg total) by mouth daily.    . Calcium  Carb-Cholecalciferol (CALCIUM + D3 PO) Take 1 tablet by mouth daily. Calcium 1200 mg    . ferrous sulfate 325 (65 FE) MG EC tablet Take 325 mg by mouth 2 (two) times daily with a meal.      . furosemide (LASIX) 40 MG tablet Take 40 mg by mouth See admin instructions. Take 1 tablet (40 mg) by mouth with breakfast and supper, may take an additional tablet (40 mg) at bedtime as needed for leg swelling  6  . losartan (COZAAR) 100 MG tablet Take 100 mg by mouth daily.    . meclizine (ANTIVERT) 25 MG tablet Take 25 mg by mouth 3 (three) times daily as needed.  0  . metoprolol succinate (TOPROL-XL) 50 MG 24 hr tablet Take 1 tablet (50 mg total) by mouth 2 (two) times daily. Take with or immediately following a meal.    . montelukast (SINGULAIR) 10 MG tablet Take 10 mg by mouth daily with supper.  11  . pantoprazole (PROTONIX) 40 MG tablet Take 40 mg by mouth daily.      . potassium chloride SA (K-DUR,KLOR-CON) 20 MEQ tablet Take 2 tablets (40 mEq total) by mouth daily with supper.    . pravastatin (PRAVACHOL) 20 MG tablet Take 20 mg by mouth daily with supper.   11  . spironolactone (ALDACTONE) 25 MG tablet Take 0.5 tablets (12.5 mg total) by mouth daily.    . traMADol (ULTRAM) 50 MG tablet Take 1-2 tablets (50-100 mg total) by mouth every 4 (four) hours as needed for moderate pain. 30 tablet 0  . warfarin (COUMADIN) 2.5 MG tablet Take 2.5-3.75 mg by mouth daily with supper. Take 1 tablet (2.5 mg) by mouth on 1st day, then take 1 1/2 tablets (3.75 mg) on 2nd day, then repeat     No current facility-administered medications for this visit.     Allergies  Allergen Reactions  . Codeine Nausea And Vomiting  . Darvocet [Propoxyphene N-Acetaminophen] Nausea And Vomiting  . Iodine Other (See Comments)    Pt does not recall this reaction  . Lisinopril Other (See Comments)    Possible cough    Review of Systems   No hospitalizations since her redo sternotomy and repair of aortic pseudoaneurysm last  year. She is somewhat despondent  over her ankle swelling and decreased exercise tolerance from combination of pulmonary hypertension and right heart dysfunction. No bleeding complications from her Coumadin        Review of Systems :  [ y ] = yes, [  ] = no        General :  Weight gain Mahler.Beck[yes   ]    Weight loss  [   ]  Fatigue [ yes ]  Fever [  ]  Chills  [  ]                                Weakness  [  ]           HEENT    Headache [  ]  Dizziness [  ]  Blurred vision [  ] Glaucoma  [  ]                          Nosebleeds [  ] Painful or loose teeth [  ]        Cardiac :  Chest pain/ pressure [  ]  Resting SOB [  ] exertional SOB Mahler.Beck  ]                        Orthopnea [  ]  Pedal edema  [  ]  Palpitations [  ] Syncope/presyncope [ ]                         Paroxysmal nocturnal dyspnea [  ]         Pulmonary : cough [  ]  wheezing [  ]  Hemoptysis [  ] Sputum [  ] Snoring [  ]                              Pneumothorax [  ]  Sleep apnea [  ]        GI : Vomiting [  ]  Dysphagia [  ]  Melena  [  ]  Abdominal pain [  ] BRBPR [  ]              Heart burn [  ]  Constipation [  ] Diarrhea  [  ] Colonoscopy [   ]        GU : Hematuria [  ]  Dysuria [  ]  Nocturia [  ] UTI's [  ]        Vascular : Claudication [  ]  Rest pain [  ]  DVT [  ] Vein stripping [  ] leg ulcers [  ]                          TIA [  ] Stroke [  ]  Varicose veins [  ]        NEURO :  Headaches  [  ] Seizures [  ] Vision changes [  ] Paresthesias [  ]                                       Seizures [  ]        Musculoskeletal :  Arthritis [  ] Gout  [  ]  Back pain [  ]  Joint pain [  ]        Skin :  Rash [  ]  Melanoma [  ] Sores [  ]        Heme : Bleeding problems [  ]Clotting Disorders [  ] Anemia [  ]Blood Transfusion [ ]   Endocrine : Diabetes [  ] Heat or Cold intolerance [  ] Polyuria [  ]excessive thirst [ ]         Psych : Depression [ yes ]  Anxiety [  ]  Psych hospitalizations [  ] Memory change  [  ]                                               BP (!) 166/82 (BP Location: Left Arm, Patient Position: Sitting, Cuff Size: Normal)   Pulse 66   Resp 20   Ht 5\' 3"  (1.6 m)   Wt 174 lb (78.9 kg)   SpO2 93% Comment: RA  BMI 30.82 kg/m  Physical Exam Alert and responsive but somewhat depressed 75 year old female Neck remains mildly elevated Breath sounds clear Heart rate irregular consistent with atrial fibrillation 2/6 systolic murmur of tricuspid regurgitation 2+ bilateral ankle edema Periphery is warm and well perfused No focal motor deficit Abdomen soft without pulsatile mass  Diagnostic Tests: Chest x-ray shows previous sternotomy, cardiac silhouette stable, no pleural effusion or airway disease  Impression: Patient without evidence recurrent ascending aortic pseudoaneurysm after repair last year. She has a cardiologist at [facility] to monitor and care for her atrial fib, right heart failure, and moderate tricuspid regurgitation. I provided the patient with a prescription for Tessalon Perles for her nagging cough. She wishes to return here as needed.   Plan: The patient is we'll be available to see her if she has any issues regarding her previous cardiac surgical operations. Mikey Bussing, MD [facility] [phone]

## 2015-10-14 ENCOUNTER — Ambulatory Visit: Payer: Medicare Other | Admitting: Cardiothoracic Surgery

## 2015-10-16 ENCOUNTER — Ambulatory Visit: Payer: Medicare Other | Admitting: Cardiothoracic Surgery

## 2016-02-11 DEATH — deceased
